# Patient Record
Sex: Female | Born: 1935
Health system: Southern US, Community
[De-identification: ages and names within clinical notes are randomized; demographics above are authoritative.]

## PROBLEM LIST (undated history)

## (undated) DIAGNOSIS — H6120 Impacted cerumen, unspecified ear: Secondary | ICD-10-CM

## (undated) DIAGNOSIS — I10 Essential (primary) hypertension: Secondary | ICD-10-CM

## (undated) DIAGNOSIS — E785 Hyperlipidemia, unspecified: Secondary | ICD-10-CM

## (undated) DIAGNOSIS — Z888 Allergy status to other drugs, medicaments and biological substances status: Secondary | ICD-10-CM

## (undated) DIAGNOSIS — L509 Urticaria, unspecified: Secondary | ICD-10-CM

## (undated) DIAGNOSIS — M4800 Spinal stenosis, site unspecified: Secondary | ICD-10-CM

## (undated) DIAGNOSIS — L03119 Cellulitis of unspecified part of limb: Secondary | ICD-10-CM

## (undated) DIAGNOSIS — I491 Atrial premature depolarization: Secondary | ICD-10-CM

## (undated) DIAGNOSIS — M810 Age-related osteoporosis without current pathological fracture: Secondary | ICD-10-CM

## (undated) DIAGNOSIS — I272 Pulmonary hypertension, unspecified: Secondary | ICD-10-CM

## (undated) DIAGNOSIS — K449 Diaphragmatic hernia without obstruction or gangrene: Secondary | ICD-10-CM

## (undated) DIAGNOSIS — M79609 Pain in unspecified limb: Secondary | ICD-10-CM

## (undated) DIAGNOSIS — H9190 Unspecified hearing loss, unspecified ear: Secondary | ICD-10-CM

## (undated) DIAGNOSIS — L02519 Cutaneous abscess of unspecified hand: Secondary | ICD-10-CM

## (undated) DIAGNOSIS — IMO0001 Reserved for inherently not codable concepts without codable children: Secondary | ICD-10-CM

## (undated) DIAGNOSIS — E041 Nontoxic single thyroid nodule: Secondary | ICD-10-CM

## (undated) DIAGNOSIS — E559 Vitamin D deficiency, unspecified: Secondary | ICD-10-CM

## (undated) DIAGNOSIS — IMO0002 Reserved for concepts with insufficient information to code with codable children: Secondary | ICD-10-CM

## (undated) DIAGNOSIS — R634 Abnormal weight loss: Secondary | ICD-10-CM

## (undated) DIAGNOSIS — I6990 Unspecified sequelae of unspecified cerebrovascular disease: Secondary | ICD-10-CM

## (undated) DIAGNOSIS — N390 Urinary tract infection, site not specified: Secondary | ICD-10-CM

## (undated) DIAGNOSIS — R350 Frequency of micturition: Secondary | ICD-10-CM

## (undated) DIAGNOSIS — Z9181 History of falling: Secondary | ICD-10-CM

## (undated) DIAGNOSIS — K5730 Diverticulosis of large intestine without perforation or abscess without bleeding: Secondary | ICD-10-CM

## (undated) DIAGNOSIS — M1990 Unspecified osteoarthritis, unspecified site: Secondary | ICD-10-CM

## (undated) DIAGNOSIS — J069 Acute upper respiratory infection, unspecified: Secondary | ICD-10-CM

## (undated) DIAGNOSIS — I071 Rheumatic tricuspid insufficiency: Secondary | ICD-10-CM

## (undated) DIAGNOSIS — S93402A Sprain of unspecified ligament of left ankle, initial encounter: Secondary | ICD-10-CM

## (undated) HISTORY — DX: Unspecified hearing loss, unspecified ear: H91.90

## (undated) HISTORY — DX: Pain in unspecified limb: M79.609

## (undated) HISTORY — DX: Cutaneous abscess of unspecified hand: L02.519

## (undated) HISTORY — DX: History of falling: Z91.81

## (undated) HISTORY — DX: Urinary tract infection, site not specified: N39.0

## (undated) HISTORY — DX: Diverticulosis of large intestine without perforation or abscess without bleeding: K57.30

## (undated) HISTORY — DX: Reserved for inherently not codable concepts without codable children: IMO0001

## (undated) HISTORY — DX: Nontoxic single thyroid nodule: E04.1

## (undated) HISTORY — DX: Allergy status to other drugs, medicaments and biological substances status: Z88.8

## (undated) HISTORY — DX: Age-related osteoporosis without current pathological fracture: M81.0

## (undated) HISTORY — DX: Impacted cerumen, unspecified ear: H61.20

## (undated) HISTORY — DX: Acute upper respiratory infection, unspecified: J06.9

## (undated) HISTORY — DX: Cellulitis of unspecified part of limb: L03.119

## (undated) HISTORY — DX: Atrial premature depolarization: I49.1

## (undated) HISTORY — DX: Abnormal weight loss: R63.4

## (undated) HISTORY — DX: Unspecified osteoarthritis, unspecified site: M19.90

## (undated) HISTORY — DX: Frequency of micturition: R35.0

## (undated) HISTORY — DX: Hyperlipidemia, unspecified: E78.5

## (undated) HISTORY — DX: Vitamin D deficiency, unspecified: E55.9

## (undated) HISTORY — DX: Urticaria, unspecified: L50.9

## (undated) HISTORY — DX: Unspecified sequelae of unspecified cerebrovascular disease: I69.90

## (undated) HISTORY — DX: Spinal stenosis, site unspecified: M48.00

## (undated) HISTORY — DX: Sprain of unspecified ligament of left ankle, initial encounter: S93.402A

## (undated) HISTORY — DX: Reserved for concepts with insufficient information to code with codable children: IMO0002

## (undated) HISTORY — DX: Diaphragmatic hernia without obstruction or gangrene: K44.9

---

## 1985-03-11 HISTORY — PX: LARYNGOSCOPY: SUR817

## 1988-03-11 HISTORY — PX: BACK SURGERY: SHX140

## 1990-03-11 HISTORY — PX: COLONOSCOPY: SHX174

## 1993-03-11 HISTORY — PX: FACIAL COSMETIC SURGERY: SHX629

## 1998-03-11 DIAGNOSIS — M810 Age-related osteoporosis without current pathological fracture: Secondary | ICD-10-CM

## 1998-03-11 DIAGNOSIS — IMO0002 Reserved for concepts with insufficient information to code with codable children: Secondary | ICD-10-CM

## 1998-03-11 DIAGNOSIS — K5730 Diverticulosis of large intestine without perforation or abscess without bleeding: Secondary | ICD-10-CM

## 1998-03-11 HISTORY — DX: Age-related osteoporosis without current pathological fracture: M81.0

## 1998-03-11 HISTORY — DX: Diverticulosis of large intestine without perforation or abscess without bleeding: K57.30

## 1998-03-11 HISTORY — DX: Reserved for concepts with insufficient information to code with codable children: IMO0002

## 1998-08-15 DIAGNOSIS — K449 Diaphragmatic hernia without obstruction or gangrene: Secondary | ICD-10-CM

## 1998-08-15 DIAGNOSIS — E041 Nontoxic single thyroid nodule: Secondary | ICD-10-CM

## 1998-08-15 HISTORY — DX: Nontoxic single thyroid nodule: E04.1

## 1998-08-15 HISTORY — DX: Diaphragmatic hernia without obstruction or gangrene: K44.9

## 2000-03-11 DIAGNOSIS — M4800 Spinal stenosis, site unspecified: Secondary | ICD-10-CM

## 2000-03-11 HISTORY — DX: Spinal stenosis, site unspecified: M48.00

## 2000-03-31 ENCOUNTER — Encounter: Admission: RE | Admit: 2000-03-31 | Discharge: 2000-03-31 | Payer: Self-pay | Admitting: Internal Medicine

## 2000-03-31 ENCOUNTER — Encounter: Payer: Self-pay | Admitting: Internal Medicine

## 2000-07-22 DIAGNOSIS — M1990 Unspecified osteoarthritis, unspecified site: Secondary | ICD-10-CM

## 2000-07-22 HISTORY — DX: Unspecified osteoarthritis, unspecified site: M19.90

## 2005-03-11 DIAGNOSIS — E785 Hyperlipidemia, unspecified: Secondary | ICD-10-CM

## 2005-03-11 DIAGNOSIS — I10 Essential (primary) hypertension: Secondary | ICD-10-CM

## 2005-03-11 HISTORY — DX: Hyperlipidemia, unspecified: E78.5

## 2005-03-11 HISTORY — DX: Essential (primary) hypertension: I10

## 2005-12-20 ENCOUNTER — Inpatient Hospital Stay (HOSPITAL_COMMUNITY): Admission: EM | Admit: 2005-12-20 | Discharge: 2005-12-23 | Payer: Self-pay | Admitting: Emergency Medicine

## 2005-12-20 ENCOUNTER — Encounter: Payer: Self-pay | Admitting: Cardiology

## 2005-12-20 ENCOUNTER — Ambulatory Visit: Payer: Self-pay | Admitting: Cardiology

## 2006-01-01 DIAGNOSIS — I6990 Unspecified sequelae of unspecified cerebrovascular disease: Secondary | ICD-10-CM

## 2006-01-01 HISTORY — DX: Unspecified sequelae of unspecified cerebrovascular disease: I69.90

## 2006-10-05 ENCOUNTER — Inpatient Hospital Stay (HOSPITAL_COMMUNITY): Admission: EM | Admit: 2006-10-05 | Discharge: 2006-10-06 | Payer: Self-pay | Admitting: Emergency Medicine

## 2006-10-10 DIAGNOSIS — I491 Atrial premature depolarization: Secondary | ICD-10-CM | POA: Insufficient documentation

## 2006-10-10 HISTORY — DX: Atrial premature depolarization: I49.1

## 2007-01-07 DIAGNOSIS — IMO0001 Reserved for inherently not codable concepts without codable children: Secondary | ICD-10-CM

## 2007-01-07 HISTORY — DX: Reserved for inherently not codable concepts without codable children: IMO0001

## 2008-11-01 DIAGNOSIS — L509 Urticaria, unspecified: Secondary | ICD-10-CM

## 2008-11-01 HISTORY — DX: Urticaria, unspecified: L50.9

## 2010-03-11 DIAGNOSIS — M79609 Pain in unspecified limb: Secondary | ICD-10-CM

## 2010-03-11 DIAGNOSIS — Z9181 History of falling: Secondary | ICD-10-CM

## 2010-03-11 HISTORY — DX: History of falling: Z91.81

## 2010-03-11 HISTORY — DX: Pain in unspecified limb: M79.609

## 2010-03-31 ENCOUNTER — Encounter: Payer: Self-pay | Admitting: Physical Medicine and Rehabilitation

## 2010-07-16 ENCOUNTER — Other Ambulatory Visit: Payer: Self-pay | Admitting: Internal Medicine

## 2010-07-16 DIAGNOSIS — R200 Anesthesia of skin: Secondary | ICD-10-CM

## 2010-07-19 ENCOUNTER — Ambulatory Visit
Admission: RE | Admit: 2010-07-19 | Discharge: 2010-07-19 | Disposition: A | Discharge status: Home / Self Care | Payer: MEDICARE | Source: Ambulatory Visit | Attending: Internal Medicine | Admitting: Internal Medicine

## 2010-07-19 DIAGNOSIS — R200 Anesthesia of skin: Secondary | ICD-10-CM

## 2010-07-24 NOTE — Discharge Summary (Signed)
[name], VIRAMONTES                ACCOUNT NO.:  [id number]   MEDICAL RECORD NO.:  [id number]          PATIENT TYPE:  INP   LOCATION:  6710                         FACILITY:  [facility]   PHYSICIAN:  Marcellus Scott, MD     DATE OF BIRTH:  05/14/1924   DATE OF ADMISSION:  10/05/2006  DATE OF DISCHARGE:  10/06/2006                               DISCHARGE SUMMARY   PRIMARY MEDICAL DOCTOR:  Lenon Curt. Chilton Si, M.D. of [facility].   DISCHARGE DIAGNOSES:  1. Syncope, probably vasovagal.  2. Urinary tract infection.  3. Prior cerebrovascular accident.  4. Dyslipidemia.  5. Osteoporosis.  6. History of systolic hypertension.   DISCHARGE MEDICATIONS:  1. Enteric-coated aspirin 81 mg p.o. daily.  2. Calcium 500 plus vitamin D one tablet p.o. 3 times a day.  3. Lipitor 20 mg p.o. q.h.s.  4. Magnesium 1 tablet p.o. 3 times daily.  5. Didronel every 3 months.  6. Centrum Silver 1 p.o. daily.  7. Tylenol extra strength 500 mg p.o. 3 times a day.  8. Vitamin E 200 mg p.o. daily.  9. Cipro 500 mg p.o. twice daily for 1 week.   PROCEDURES:  1. MRI of the brain without contrast, impression:      a.     No acute intracranial abnormality.      b.     Chronic small vessel disease and generalized volume loss.  2. Chest x-ray, impression:  No acute findings.  3. CT of the head without contrast, impression:      a.     No acute abnormality.      b.     Stable mild diffuse cerebral and cerebellar atrophy.      c.     Stable mild to moderate chronic small vessel white matter       ischemic changes in both hemispheres.      d.     Old bilateral lacunar infarcts.   PERTINENT LABORATORY:  Cardiac panel cycled x3, negative.  TSH of 0.442,  consider repeating TSH and free T4 and T3 as an outpatient.  Lipid panel  with total cholesterol 117, triglycerides 66, HDL 52, LDL 52, VLDL 13.  Urine culture is pending.   CONSULTATIONS:  None.   HOSPITAL COURSE AND PATIENT DISPOSITION:  For details of the  initial  part of the admission, please refer to the history and physical note  done by Dr. Waymon Amato.  In summary, [name] is a pleasant 77 year old  Caucasian female patient with a history of prior CVA with no residual  deficits, systolic hypertension- controlled by diet alone,  hyperlipidemia, and osteoporosis who was feeling unwell for the last  couple of days.  However, on the day of admission while standing in the  serving line at the church service, she felt dizzy, lightheaded, and  subsequently became unresponsive for approximately 10 minutes with  associated diaphoresis, ill look, thready pulse, and associated right  sided weakness with no seizure-like activity.  The patient also had some  urinary symptoms of frequency and lower abdominal discomfort.  She was  made to lie flat and within 10 minutes she regained consciousness with  almost resolution of her right extremity weakness, except for a slight  heaviness in both lower extremities.  She had no cardiorespiratory  symptoms.  The patient presented to the emergency room where further  evaluation revealed that she was hemodynamically stable, awake, alert,  oriented, with no focal neurological deficits.  She was, however,  admitted to the telemetry bed where there have been no alarms.  A workup  has been negative for any new strokes.  The patient is currently  hemodynamically stable, asymptomatic, and stable to be discharged home.  It is thought that her syncope was probably secondary to a vasovagal  attack, versus her ongoing urinary tract infection.   The patient has been placed on a course of ciprofloxacin and her urine  cultures have to be followed up as an outpatient.  She has an  appointment to see her primary medical doctor in approximately 10-14  days' time.  She has been instructed to seek immediate medical attention  if there is any further deterioration in her condition.      Marcellus Scott, MD  Electronically  Signed     AH/MEDQ  D:  10/06/2006  T:  10/06/2006  Job:  [id number]   cc:   Lenon Curt. Chilton Si, M.D.

## 2010-07-24 NOTE — H&P (Signed)
NAMEMCKAYLEE, DIMALANTA                ACCOUNT NO.:  [id number]   MEDICAL RECORD NO.:  [id number]          PATIENT TYPE:  INP   LOCATION:  6710                         FACILITY:  [facility]   PHYSICIAN:  Marcellus Scott, MD     DATE OF BIRTH:  12-19-1924   DATE OF ADMISSION:  10/05/2006  DATE OF DISCHARGE:                              HISTORY & PHYSICAL   PRIMARY CARE PHYSICIAN:  Lenon Curt. Chilton Si, MD, of [facility].   CHIEF COMPLAINT:  Passing out this morning.   HISTORY OF PRESENTING ILLNESS:  Ms. Bloch is a pleasant, 77 year old,  Caucasian female patient with past medical history as indicated below.  The patient's very close friend, Ms. Lanna Poche, who was with the  patient when this episode happened, is at the bedside to provide  details.  The patient has been feeling unwell for the last couple of  days.  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  While  waiting in the line, the patient complained of feeling dizzy and  lightheaded.  She subsequently sat on a chair.  However, patient was  noted to become pale, diaphoretic, staring blankly in space, not  responding appropriately, and not moving her right extremities.  Following this, the patient was lifted and put on a flat surface.  She  was noted to have a thready pulse in the 60s.  911 was called.  Over the  next 10 minutes, the patient improved and became appropriately  responsive and started moving her right limbs.  The patient was  subsequently brought to the emergency room.  She says she feels  significantly better but still complains of some heaviness in both lower  extremities, right lower than the left.  She denies a history of  headache, earache, sore throat, palpitations, or chest pain prior to the  episode or since.  The patient has no recollection of the event.  There  was no history of any seizure-like activity.   PAST MEDICAL  HISTORY:  1. History of a small, acute, right caudate, hemorrhagic infarct in      October of 2007 with no residual deficits.  2. Vertigo.  3. Chronic microvascular ischemic brain changes on CT.  4. Systolic hypertension not on medications.  5. Dyslipidemia.  6. Osteoporosis.  7. History of degenerative joint disease of the lumbosacral spine with      chronic low back pain.  8. Mammogram this year negative for malignancy.   PAST SURGICAL HISTORY:  Back surgery in 1990.   ALLERGIES:  No known drug allergies.   MEDICATIONS:  1. Enteric-coated aspirin 81 mg p.o. daily.  2. Calcium 500, one p.o. three times a day.  3. Lipitor 20 mg p.o. q.h.s.  4. Magnesium one tablet p.o. three times a day.  5. Medication for osteoporosis every three months.  6. Centrum one p.o. daily.  7. Tylenol Extra Strength one p.o. three times a day.   FAMILY HISTORY:  1. Father died at age 42 of a massive cerebrovascular accident.  2.  Mother had a cerebrovascular accident in 1979, which left her      debilitated with a feeding tube and in an incapacitated state, and      she subsequently died at age 23 years.   SOCIAL HISTORY:  The patient is widowed for the last 21 years.  She is a  retired Education officer, museum, who worked with [employer].  She has no  history of smoking, alcohol, or drug abuse.  She has two adult kids, one  in [place] and the other in [place].   ADVANCED DIRECTIVES:  The patient is a Full Code.   REVIEW OF SYSTEMS:  Over 10 systems reviewed and pertinent for frequency  of urine, cloudy urine, lower abdominal discomfort.  However, no fever,  chills, rigors, or dysuria.   PHYSICAL EXAMINATION:  GENERAL:  Ms. Grandmaison is a moderately built and  nourished female patient in no obvious distress.  VITAL SIGNS:  Temperature is 97.4 degrees Fahrenheit, blood pressure  133/71, which was 159/59 on arrival, no orthostatic changes, pulse 64  per minute regular, respirations 20 per minute regular,  saturating at  98% on room air.  HEAD, EYES, ENT:  Nontraumatic, normocephalic.  Pupils bilaterally  equally reacting to light and accommodation.  Immature cataracts.  Extraocular muscle movements are intact. Oral cavity with no  oropharyngeal erythema.  Uvula is midline and tongue movements are  normal.  NECK:  No JVD or carotid bruit or lymphadenopathy or goiter.  Supple.  LYMPHATICS:  There is no lymphadenopathy.  RESPIRATORY SYSTEM:  Clear to auscultation bilaterally.  CARDIOVASCULAR SYSTEM:  First and second heart sounds are heard, RRR, no  third or fourth heart sounds or murmurs or rubs or gallops or clicks.  ABDOMEN:  Nondistended, minimal tenderness in the suprapubic area, but  no rigidity, guarding, or rebound.  No organomegaly or masses are  palpated.  Bowel sounds are normally heard.  CENTRAL NERVOUS SYSTEM:  The patient is awake, alert, oriented x3.  No  cranial nerve deficits.  EXTREMITIES:  With no cyanosis, clubbing, or edema;  peripheries are  slightly cool.  Peripheral pulses are symmetrically felt and bounding.  There are no focal deficits.  SKIN:  Without any rashes.   PERTINENT LABORATORY RESULTS:  Urinalysis with many bacteria, white  blood cells too numerous to count.  Comprehensive metabolic panel  unremarkable with a glucose of 106, BUN 15, creatinine of 0.99.  Point-  of-care cardiac markers are negative.  PT-INR normal.  CBC is normal  with a hemoglobin of 13.6, hematocrit of 40.8.  A CT of the head without  contrast.  Impression:  A)  No acute abnormality; B)  Stable, mild,  diffuse cerebral bilateral atrophy; C)  Stable mild to moderate chronic  small vessel white matter ischemic changes in both hemispheres; D)  Old  bilateral lacunar infarcts as described above.   A chest x-ray, which has been reviewed by me and reported as no acute  findings.   EKG with a normal sinus rhythm at 62 beats per minute with no acute  changes.   ASSESSMENT AND PLAN:  1.  Syncope.  Etiology is probably vasovagal secondary to exposure to      heat and prolonged standing.  A urinary tract infection could have      contributed to this, however, to rule out a possible stroke. Will      admit patient to telemetry.  Will obtain a magnetic resonance      imaging of the head.  Will cycle patient's cardiac enzymes.  2. Urinary tract infection.  Will send off urine for culture and place      the patient on empiric intravenous ciprofloxacin.  3. History of prior cerebrovascular accident.  Will continue aspirin.  4. History of dyslipidemia.  Will check fasting lipids and continue      the patient's Lipitor.  5. Osteoporosis.  6. History of systolic hypertension.  Blood pressures at this time      have normalized and will monitor off medications.      Marcellus Scott, MD  Electronically Signed     AH/MEDQ  D:  10/05/2006  T:  10/05/2006  Job:  [id number]   cc:   Lenon Curt. Chilton Si, M.D.

## 2010-07-27 NOTE — H&P (Signed)
[name], Cox                ACCOUNT NO.:  [id number]   MEDICAL RECORD NO.:  [id number]          PATIENT TYPE:  EMS   LOCATION:  ED                           [facility]AC[facility]   PHYSICIAN:  Mobolaji B. Bakare, M.D.DATE OF BIRTH:  January 02, 1925   DATE OF ADMISSION:  12/19/2005  DATE OF DISCHARGE:                                HISTORY & PHYSICAL   PRIMARY CARE PHYSICIAN:  Lenon Curt. Chilton Si, M.D.   CHIEF COMPLAINT:  Dizziness/spinning sensation which started about 6 a.m.  today.   HISTORY OF PRESENTING COMPLAINT:  [name] is a pleasant 77 year old  Caucasian female who has no known medical illness or back pain.  She was in  her usual state of health until about 6 a.m. this morning when she woke up  and wanted to use the bathroom.  She realized that she was unsteady on her  feet and had to hold on to the wall.  This has progressively gotten worse  through the day.  She called Dr. Thomasene Lot office and was given an appointment  to see a physician at 6:30, but she could not make this appointment because  the symptoms became unbearable.  She lives alone; hence, she called an  ambulance and was brought to the emergency room.  This unsteadiness is  associated with a spinning sensation around her.  There is associated nausea  and vomiting, but no diarrhea.  Each time she tries to get up, she feels  nauseous and develops a spinning sensation.  She denies any tinnitus,  hearing loss, otorrhea or rhinorrhea.  There was no recent upper respiratory  infection.  This is not related to any particular positioning.  She has  received antibiotics in the emergency room, and the patient currently feels  better.  Because she lives alone, and she was uncomfortable being discharged  home, the patient is going to be admitted for 24 hour observation.   In addition, she had a head CT scan which showed no bleed.  It was commented  that the basilar artery is slightly dense, which may be normal, but cannot  rule out thrombosis.  The ER physician discussed with Dr. Chestine Spore  (radiologist), and he reviewed the CT scan and compared to a followup MRI,  he stated that the basilar artery is patent.  Other findings on CT scan of  the head include atrophy and chronic microvascular white matter disease,  posterior right extracapsular small infarction, likely remote.  A followup  MRI of the brain showed a small enhancing area of the right caudate,  probable subacute infarction, but the diffusion imaging was negative.  A  followup MRI in 2-4 weeks was recommended.   REVIEW OF SYSTEMS:  The patient denies diarrhea, abdominal pain.  No chest  pain, shortness of breath.  No dysuria or urgency.  There are no headaches  or change in her vision.   PAST MEDICAL HISTORY:  Chronic back pain.  She is scheduled for have a  steroid shot sometime next week.   MEDICATIONS:  Aspirin 325 mg one three times a day.  The patient  has been  using this chronically to relieve her pain.   ALLERGIES:  No known drug allergies.   FAMILY HISTORY:  She has a significant family history of stroke in a father  who passed away from a massive stroke at the age of 52.  Mother also had a  stroke at the age of 102.  She died at the age of 9.   SOCIAL HISTORY:  The patient lives alone.  She is a widow.  She has 2 sons -  1 in [place] and 1 in [place].  She does smoke cigarettes or drink alcohol.  She worked at [employer] in the past.  She is currently retired.   PHYSICAL EXAMINATION:  INITIAL VITALS:  Temperature 98.4, blood pressure  121/86, pulse of 90, respiratory rate 18.  O2 saturation of 96% on room air.  GENERAL:  The patient is awake, alert, oriented to time, place and person.  HEENT:  Normocephalic and atraumatic.  Extraocular muscle movements intact.  There is nystagmus to the right.  Pupils equal, round and reactive to light.  No carotid bruit.  No elevated JVD.  No thyromegaly.  LUNGS:  Clear clinically to  auscultation.  CV:  S1 and S2, regular.  No murmur, no gallop.  ABDOMEN:  Nondistended, soft, nontender.  EXTREMITIES:  No pedal edema, no calf tenderness.  Dorsalis pedis pulses 2+  bilaterally.  CNS:  There are no focal neurological deficits.  Muscle power 5/5 in all  limbs and symmetrical.  Deep tendon reflexes 2+ in both triceps and biceps.  Symmetrical bilateral knee jerk 1+ bilaterally and symmetrical.  Cranial  nerves II-XII revealed no deficit.   INITIAL LABORATORY DATA:  Salicylate level less than 4.  Sodium 138,  potassium 4.0, chloride 107, CO2 of 26, glucose 116, BUN 10, creatinine  0.87, bilirubin 0.5.  Alkaline phosphatase was 20, AST 24, ALT 15.  Total  protein 6.3.  Albumin 3.3, calcium 8.6.  EKG revealed normal sinus rhythm.  No acute ST changes.  Normal intervals.  Heart rate of 70 beats per minute.   ASSESSMENT AND PLAN:  [name] is a 77 year old elderly lady presenting  with acute onset of vertigo, nausea, vomiting.  MRI of the head is  suspicious for some acute infarction in the caudate lobe on the right side.  I do not think this would be responsible for acute symptoms.  However, there  is a suspicion for thrombosis in the basilar artery.  1. Vertigo.  Probable labyrinthitis.  Symptomatically, it does not have to      be benign positional vertigo.  We will need to exclude posterior      circulation stenosis.  Will treat symptomatically with Antivert 12.5 mg      b.i.d. and q.h.s. p.r.n.  Continue aspirin 325 mg daily.  Check MRI of      the neck.  Phenergan will be given 6.25 mg q.4h. p.r.n. for nausea and      vomiting.  PT and OT will be consulted.  2. Question of subacute infarction, right caudate lobe.  Will initiate      stroke work-up with a homocystine level, fasting lipid profile.  Will      check hemoglobin A1C, 2-D echocardiogram, and carotid Dopplers.  Would      continue full dose aspirin.  If cerebrovascular accident is confirmed,     consideration  will be given to changing aspirin to Aggrenox.      Mobolaji B. Corky Downs, M.D.  Electronically Signed     MBB/MEDQ  D:  12/20/2005  T:  12/20/2005  Job:  [id number]   cc:   Lenon Curt. Chilton Si, M.D.  Fax: [phone]

## 2010-07-27 NOTE — Discharge Summary (Signed)
NAMEVERDELLA, LAIDLAW                ACCOUNT NO.:  [id number]   MEDICAL RECORD NO.:  [id number]          PATIENT TYPE:  INP   LOCATION:  1429                         FACILITY:  [facility]   PHYSICIAN:  Lonia Blood, M.D.       DATE OF BIRTH:  [birth year]   DATE OF ADMISSION:  12/19/2005  DATE OF DISCHARGE:  12/23/2005                                 DISCHARGE SUMMARY   PRIMARY CARE PHYSICIAN:  Lenon Curt. Chilton Si, M.D.   DISCHARGE DIAGNOSES:  1. Small, acute hemorrhagic infarct in the right caudate.  2. Vertigo, most likely secondary to the stroke, resolving.  3. Chronic microvascular ischemic changes in the brain.  4. Systolic hypertension.  5. Mild hyperlipidemia.  6. Chronic lumbosacral spine degenerative changes with chronic low back      pain.   DISCHARGE MEDICATIONS:  1. Tylenol 500 mg by mouth every 4 hours as needed for pain.  2. Vicodin 5/500 one every 6 hours as needed for severe pain.  3. Meclizine 12.5 mg three times a day as needed for dizziness.  4. Aspirin 81 mg to start on January 02, 2006.  5. Multivitamin one daily.   CONDITION ON DISCHARGE:  The patient was discharged in good condition at  time of discharge.  All her neurological symptoms have resolved.  She was  instructed to follow up with Dr. Murray Hodgkins.  At time of followup, she  will negotiate initiation of antihypertensive medication and she will report  about the side effects of the Vicodin.  The patient will also continued to  follow up with her primary orthopedic surgeon for her back pain.  At the  time of the discharge, the patient was set up with home health PT and OT for  safety evaluation.   PROCEDURES:  1. On December 19, 2005, the patient underwent a noncontrast head CT that      showed small infarct in the posterior right external capsule with      indeterminate age.  2. On December 19 1005,  magnetic resonance imaging of the brain that      showed subacute, 1 cm, hemorrhagic infarct in the right  caudate.  3. Magnetic resonance angiogram of the brain on December 19, 2005, showing      mild atherosclerosis, but no definite stenosis.  4. On December 20, 2005, the patient underwent carotid ultrasound with no      internal carotid artery stenosis bilaterally.  5. On December 20, 2005, transthoracic echocardiogram showing ejection      fraction of 55-60%, mild increase in the left ventricular wall      thickness, otherwise fairly normal transthoracic echocardiogram.   CONSULTATIONS:  No consultations were obtained.   HISTORY OF PRESENT ILLNESS:  For admission history and physical, refer to  dictated H&P done by Dr. Corky Downs on December 19, 2005.   HOSPITAL COURSE:  Problem 1.  ACUTE ONSET VERTIGO:  Ms. Worth was admitted  on the night of December 19, 2005, with complaints of acute onset vertigo,  nystagmus and balance problems.  She was found to have a small  lacunar CVA  that was hemorrhagic in nature.  We have discontinued the patient's aspirin  and observed her in the hospital and slowly, but surely her symptoms have  resolved completely.  We feel that she probably had a small lacunar stroke  related to her hypertension that had a hemorrhagic transformation from the  high dose of aspirin that the patient was taking.  In the long run, the  patient definitely will benefit from lower doses of aspirin to prevent  recurrent lacunar strokes.   Problem 2.  MILD SYSTOLIC HYPERTENSION:  We had long discussions with Ms.  Navarrette about the importance of maybe initiating antihypertensive treatment.  She is very reluctant to do so due to numerous side effects of the  antihypertensives.  I will leave it up to the patient's primary care  physician initiation of antihypertensive treatment, but probably a low-dose  diuretic will be a good idea.   Problem 3.  MILD HYPERLIPIDEMIA:  Ms. Paulson LDL was found to be 120.  Given  her current risk factor profile, she is right at the borderline for needing  to be  treated.  Again, I will leave this to the discretion of primary care  physician due to the fact the patient is kind of reluctant to start taking  medications.   Problem 4.  CHRONIC BACK PAIN SECONDARY TO DEGENERATIVE DISK DISEASE OF THE  LUMBOSACRAL SPINE:  Ms. Pickerel was taking off of high dose aspirin and she  was transitioned to Tylenol for her back pain.  She continued to have  significant back pain, so for this reason she was advised to use Vicodin as  needed for severe pain.  She will follow up with Dr. Murray Hodgkins and  negotiate different pain medication if she cannot tolerate the Vicodin.      Lonia Blood, M.D.  Electronically Signed     SL/MEDQ  D:  12/23/2005  T:  12/24/2005  Job:  [id number]   cc:   Lenon Curt. Chilton Si, M.D.  Fax: [phone]

## 2010-07-31 ENCOUNTER — Emergency Department (HOSPITAL_COMMUNITY): Payer: Medicare Other

## 2010-07-31 ENCOUNTER — Emergency Department (HOSPITAL_COMMUNITY)
Admission: EM | Admit: 2010-07-31 | Discharge: 2010-07-31 | Disposition: A | Discharge status: Home / Self Care | Payer: Medicare Other | Attending: Emergency Medicine | Admitting: Emergency Medicine

## 2010-07-31 DIAGNOSIS — N390 Urinary tract infection, site not specified: Secondary | ICD-10-CM | POA: Insufficient documentation

## 2010-07-31 DIAGNOSIS — E7800 Pure hypercholesterolemia, unspecified: Secondary | ICD-10-CM | POA: Insufficient documentation

## 2010-07-31 DIAGNOSIS — M5412 Radiculopathy, cervical region: Secondary | ICD-10-CM | POA: Insufficient documentation

## 2010-07-31 DIAGNOSIS — I10 Essential (primary) hypertension: Secondary | ICD-10-CM | POA: Insufficient documentation

## 2010-07-31 DIAGNOSIS — M542 Cervicalgia: Secondary | ICD-10-CM | POA: Insufficient documentation

## 2010-07-31 DIAGNOSIS — R110 Nausea: Secondary | ICD-10-CM | POA: Insufficient documentation

## 2010-07-31 DIAGNOSIS — Z8673 Personal history of transient ischemic attack (TIA), and cerebral infarction without residual deficits: Secondary | ICD-10-CM | POA: Insufficient documentation

## 2010-07-31 LAB — CBC
HCT: 39.5 % (ref 36.0–46.0)
Hemoglobin: 13.4 g/dL (ref 12.0–15.0)
MCH: 29.8 pg (ref 26.0–34.0)
MCHC: 33.9 g/dL (ref 30.0–36.0)
MCV: 87.8 fL (ref 78.0–100.0)

## 2010-07-31 LAB — URINALYSIS, ROUTINE W REFLEX MICROSCOPIC
Bilirubin Urine: NEGATIVE
Specific Gravity, Urine: 1.009 (ref 1.005–1.030)
Urobilinogen, UA: 0.2 mg/dL (ref 0.0–1.0)

## 2010-07-31 LAB — URINE MICROSCOPIC-ADD ON

## 2010-07-31 LAB — DIFFERENTIAL
Lymphocytes Relative: 30 % (ref 12–46)
Monocytes Absolute: 0.6 10*3/uL (ref 0.1–1.0)
Monocytes Relative: 8 % (ref 3–12)
Neutro Abs: 4.9 10*3/uL (ref 1.7–7.7)

## 2010-07-31 LAB — BASIC METABOLIC PANEL
CO2: 28 mEq/L (ref 19–32)
Calcium: 9.7 mg/dL (ref 8.4–10.5)
Creatinine, Ser: 0.96 mg/dL (ref 0.4–1.2)
Glucose, Bld: 95 mg/dL (ref 70–99)

## 2010-08-01 LAB — URINE CULTURE: Culture  Setup Time: 201205230113

## 2010-09-18 ENCOUNTER — Emergency Department (HOSPITAL_BASED_OUTPATIENT_CLINIC_OR_DEPARTMENT_OTHER)
Admission: EM | Admit: 2010-09-18 | Discharge: 2010-09-18 | Disposition: A | Discharge status: Still In Hospital | Payer: Medicare Other | Source: Home / Self Care | Attending: Emergency Medicine | Admitting: Emergency Medicine

## 2010-09-18 ENCOUNTER — Inpatient Hospital Stay (HOSPITAL_COMMUNITY)
Admission: AD | Admit: 2010-09-18 | Discharge: 2010-09-20 | DRG: 603 | Disposition: A | Discharge status: Home / Self Care | Payer: Medicare Other | Source: Other Acute Inpatient Hospital | Attending: Internal Medicine | Admitting: Internal Medicine

## 2010-09-18 ENCOUNTER — Inpatient Hospital Stay (HOSPITAL_COMMUNITY): Payer: Medicare Other

## 2010-09-18 DIAGNOSIS — L0390 Cellulitis, unspecified: Secondary | ICD-10-CM

## 2010-09-18 DIAGNOSIS — D509 Iron deficiency anemia, unspecified: Secondary | ICD-10-CM | POA: Diagnosis present

## 2010-09-18 DIAGNOSIS — L02519 Cutaneous abscess of unspecified hand: Secondary | ICD-10-CM | POA: Diagnosis present

## 2010-09-18 DIAGNOSIS — L03019 Cellulitis of unspecified finger: Secondary | ICD-10-CM | POA: Diagnosis present

## 2010-09-18 DIAGNOSIS — M810 Age-related osteoporosis without current pathological fracture: Secondary | ICD-10-CM | POA: Diagnosis present

## 2010-09-18 DIAGNOSIS — T63461A Toxic effect of venom of wasps, accidental (unintentional), initial encounter: Secondary | ICD-10-CM | POA: Diagnosis present

## 2010-09-18 DIAGNOSIS — T6391XA Toxic effect of contact with unspecified venomous animal, accidental (unintentional), initial encounter: Secondary | ICD-10-CM | POA: Diagnosis present

## 2010-09-18 DIAGNOSIS — R29898 Other symptoms and signs involving the musculoskeletal system: Secondary | ICD-10-CM | POA: Diagnosis present

## 2010-09-18 DIAGNOSIS — IMO0002 Reserved for concepts with insufficient information to code with codable children: Principal | ICD-10-CM | POA: Diagnosis present

## 2010-09-18 DIAGNOSIS — I10 Essential (primary) hypertension: Secondary | ICD-10-CM | POA: Diagnosis present

## 2010-09-18 DIAGNOSIS — I69998 Other sequelae following unspecified cerebrovascular disease: Secondary | ICD-10-CM

## 2010-09-18 HISTORY — DX: Essential (primary) hypertension: I10

## 2010-09-18 LAB — BASIC METABOLIC PANEL
Calcium: 10.6 mg/dL — ABNORMAL HIGH (ref 8.4–10.5)
Creatinine, Ser: 0.9 mg/dL (ref 0.50–1.10)
GFR calc Af Amer: >60 mL/min (ref >60)
GFR calc non Af Amer: 60 mL/min — ABNORMAL LOW (ref >60)

## 2010-09-18 LAB — COMPREHENSIVE METABOLIC PANEL
BUN: 11 mg/dL (ref 6–23)
CO2: 27 mEq/L (ref 19–32)
Calcium: 8.9 mg/dL (ref 8.4–10.5)
Chloride: 97 mEq/L (ref 96–112)
Creatinine, Ser: 0.8 mg/dL (ref 0.50–1.10)
GFR calc Af Amer: >60 mL/min (ref >60)
GFR calc non Af Amer: >60 mL/min (ref >60)
Total Bilirubin: 0.3 mg/dL (ref 0.3–1.2)

## 2010-09-18 LAB — DIFFERENTIAL
Basophils Absolute: 0 10*3/uL (ref 0.0–0.1)
Basophils Relative: 0 % (ref 0–1)
Eosinophils Absolute: 0 10*3/uL (ref 0.0–0.7)
Eosinophils Relative: 0 % (ref 0–5)
Monocytes Absolute: 0.4 10*3/uL (ref 0.1–1.0)
Neutro Abs: 2.8 10*3/uL (ref 1.7–7.7)

## 2010-09-18 LAB — CK: Total CK: 32 U/L (ref 7–177)

## 2010-09-18 LAB — CBC
HCT: 43.9 % (ref 36.0–46.0)
MCH: 29.4 pg (ref 26.0–34.0)
MCHC: 34.6 g/dL (ref 30.0–36.0)
MCV: 84.9 fL (ref 78.0–100.0)
RDW: 13 % (ref 11.5–15.5)

## 2010-09-18 MED ORDER — IOHEXOL 300 MG/ML  SOLN
80.0000 mL | Freq: Once | INTRAMUSCULAR | Status: AC | PRN
  Administered 2010-09-18: 80 mL via INTRAVENOUS

## 2010-09-18 MED ORDER — SODIUM CHLORIDE 0.9 % IV SOLN
INTRAVENOUS | Status: DC
  Administered 2010-09-18 (×2): via INTRAVENOUS

## 2010-09-18 MED ORDER — VANCOMYCIN HCL 10 G IV SOLR
1.0000 g | Freq: Once | INTRAVENOUS | Status: AC
  Administered 2010-09-18: 1 g via INTRAVENOUS

## 2010-09-18 MED ORDER — VANCOMYCIN HCL IN DEXTROSE 1-5 GM/200ML-% IV SOLN
INTRAVENOUS | Status: AC
  Administered 2010-09-18: 1 g via INTRAVENOUS
  Filled 2010-09-18: qty 200

## 2010-09-18 NOTE — ED Notes (Signed)
Patient reports that she was stung by hornet on right thumb Sunday and is continuing to have pain and swelling to same with red streak up arm to right axilla

## 2010-09-18 NOTE — ED Provider Notes (Signed)
History     Chief Complaint  Patient presents with  . Insect Bite   HPI Pt presents with c/o insect bite of right thumb- was stung by hornet.  Occurred approx 2 days ago. Today noted that redness and warmth/pain was tracking up her right arm into her axilla.  No fever/chills, no systemic symptoms No lip/tongue swelling, no sob.  Past Medical History  Diagnosis Date  . Degeneration of cervicothoracic intervertebral disc   . Hypertension     History reviewed. No pertinent past surgical history.  History reviewed. No pertinent family history.  History  Substance Use Topics  . Smoking status: Never Smoker   . Smokeless tobacco: Not on file  . Alcohol Use: No    OB History    Grav Para Term Preterm Abortions TAB SAB Ect Mult Living                  Review of SystemsROS reviewed and otherwise negative except for mentioned in HPI   Physical Exam  BP 144/76  Pulse 91  Temp(Src) 97.7 F (36.5 C) (Oral)  Resp 18  SpO2 100%  Physical Exam  Vitals reviewed. Gen- awake, alert, NAD Eyes- PERRL, EOMI, normal appearance HEENT- MMM, OP clear Neck- supple, from Lungs- CTAB, breath sounds symmetric, no accessory muscle movement or dyspnea CV-RRR, no murmur/rub/gallop Abd- soft, nontender, nondistended, normal active bowel sounds GU- no CVA tenderness Ext-see skin exam, no peripheral edema, neurovascularly intact, 2+ dp pulses, brisk cap refill Skin- erythema and warmth with some edema of right thumb- ertythematous streaking up right arm to axilla     ED Course  Procedures Labs obtained including blood cultures.  Vancomycin IV started.  WBC not elevated. Will proceed with admission for IV antibiotics [facility] paged, awaiting call back.  Discussed with [facility]- temporary admission orders written, pt going to [facility], Team 5, [facility] MDM Cellulitis of right upper extremity after insect bite.        Ethelda Chick, MD 09/18/10 1415

## 2010-09-18 NOTE — H&P (Signed)
[name], [name]                ACCOUNT NO.:  [id number]  MEDICAL RECORD NO.:  [id number]  LOCATION:  1341                         FACILITY:  [facility]  PHYSICIAN:  Marinda Elk, M.D.DATE OF BIRTH:  1924/07/17  DATE OF ADMISSION:  09/18/2010 DATE OF DISCHARGE:                             HISTORY & PHYSICAL   PRIMARY CARE DOCTOR:  Lenon Curt. Chilton Si, MD  CHIEF COMPLAINT:  Thumb swelling.  HISTORY OF PRESENT ILLNESS:  This is an 77 year old with past medical history of hypertension that comes in 2 days prior to admission.  She went to grab a flower and there was a bee and it stung her thumb. Since then, her thumb has been progressively getting worse.  She takes Percocet for pain with no improvement.  Her pain has gotten more and more swollen and redness is coming up her radial side of the forearm and medial side of the arm.  She relates no fever, chills, nausea, vomiting, diarrhea, chest pain.  ALLERGIES:  No known drug allergies.  PAST MEDICAL HISTORY: 1. History of syncope in 2008. 2. UTI. 3. CVA.  Small acute right carotid hemorrhagic infarct in October 2007     with right side deficits. 4. Dyslipidemia. 5. Hypertension. 6. Osteoporosis.  MEDICATIONS: 1. Vitamin E 1000 units daily. 2. Calcium carbonate 500 mg 1 tab daily. 3. Mag oxide 250 mg 1 tab t.i.d. 4. Amlodipine 5 mg daily. 5. Percocet 5/325 mg p.o. at bedtime. 6. Fish oil 100 mg cap daily. 7. Centrum Silver b.i.d. 8. Didronel 1 tablet every 3 months. 9. Aspirin 81 mg daily. 10.Vitamin D3 1000 units daily. 11.Crestor 10 mg daily.  FAMILY HISTORY:  Father died at the age 53 of massive CVA.  Her mother died of CVA in 63 which left her debilitative with a feeding tube and passed subsequently in 56.  SOCIAL HISTORY:  The patient lives in [facility].  Denies alcohol, tobacco, or drugs.  REVIEW OF SYSTEMS:  Ten-point review of systems done, pertinent positives per HPI.  PHYSICAL EXAMINATION:   VITAL SIGNS:  Temperature 98, pulse 92, respirations of 20, she is satting 97% on room air, and blood pressure 141/69. GENERAL:  She is awake, alert and oriented x3, coherent and fluent language. HEENT:  Anicteric.  No pallor, atraumatic, normocephalic. CARDIOVASCULAR:  Regular rate and rhythm with positive S1 and S2.  No murmurs, rubs, or gallops. LUNGS:  Good air movement, clear to auscultation. ABDOMEN:  Positive bowel sounds, nontender, nondistended, and soft. EXTREMITIES:  Positive pulses.  No cyanosis, clubbing, or edema.  Her right hand thumb is swollen, tender to palpation, no fluctuations or masses.  She cannot move her joints. SKIN:  She has a red streak in the radial side of her forearm and medial side of her arm extending almost to her axilla.  We have marked this area. NEUROLOGICAL:  Alert and oriented x3, coherent and fluent language, and nonfocal.  LABORATORY DATA:  Labs on admission shows sodium 134, potassium 3.7, chloride 94, bicarb 26, glucose 106,  BUN 11, creatinine 0.9, calcium 10.6.  White count 4.6, hemoglobin of 15.2, platelet count 234 with an ANC of 2.8.  ASSESSMENT AND PLAN: 1.  Cellulitis over forearm,  secondary to bee sting.  We     will start on IV vancomycin and clindamycin, get blood cultures, monitor her     closely.  We will check a CK now and CK in the morning.  If no     improvement, she might need steroids or even call a Careers adviser.  I     will  get an ct scan of hand as it seem pretty red and tender to     palpation. To rule abscess 2. History of cerebrovascular accident, continue aspirin, no changes     will be made. 3. Hypertension, currently well controlled, no changes were made.     Marinda Elk, M.D.     AF/MEDQ  D:  09/18/2010  T:  09/18/2010  Job:  [id number]  Electronically Signed by Marinda Elk M.D. on 09/18/2010 09:56:04 PM

## 2010-09-19 LAB — BASIC METABOLIC PANEL
Chloride: 101 mEq/L (ref 96–112)
Creatinine, Ser: 0.99 mg/dL (ref 0.50–1.10)
GFR calc Af Amer: >60 mL/min (ref >60)
Potassium: 3.7 mEq/L (ref 3.5–5.1)
Sodium: 134 mEq/L — ABNORMAL LOW (ref 135–145)

## 2010-09-19 LAB — VITAMIN B12: Vitamin B-12: 1122 pg/mL — ABNORMAL HIGH (ref 211–911)

## 2010-09-19 LAB — IRON AND TIBC: UIBC: 189 ug/dL

## 2010-09-19 LAB — CBC
Platelets: 207 10*3/uL (ref 150–400)
RBC: 4.04 MIL/uL (ref 3.87–5.11)
RDW: 13.1 % (ref 11.5–15.5)
WBC: 4.5 10*3/uL (ref 4.0–10.5)

## 2010-09-19 LAB — CK: Total CK: 26 U/L (ref 7–177)

## 2010-09-19 LAB — FERRITIN: Ferritin: 87 ng/mL (ref 10–291)

## 2010-09-19 LAB — FOLATE: Folate: >20 ng/mL

## 2010-09-20 LAB — DIFFERENTIAL
Eosinophils Relative: 3 % (ref 0–5)
Lymphocytes Relative: 63 % — ABNORMAL HIGH (ref 12–46)
Lymphs Abs: 3.2 10*3/uL (ref 0.7–4.0)
Monocytes Relative: 11 % (ref 3–12)

## 2010-09-20 LAB — CBC
HCT: 35.7 % — ABNORMAL LOW (ref 36.0–46.0)
MCH: 29.5 pg (ref 26.0–34.0)
MCV: 87.7 fL (ref 78.0–100.0)
RDW: 13 % (ref 11.5–15.5)
WBC: 5 10*3/uL (ref 4.0–10.5)

## 2010-09-20 LAB — BASIC METABOLIC PANEL
BUN: 16 mg/dL (ref 6–23)
CO2: 28 mEq/L (ref 19–32)
Chloride: 101 mEq/L (ref 96–112)
Creatinine, Ser: 1.03 mg/dL (ref 0.50–1.10)
GFR calc Af Amer: >60 mL/min (ref >60)

## 2010-09-24 LAB — CULTURE, BLOOD (ROUTINE X 2)
Culture  Setup Time: 201207102347
Culture: NO GROWTH

## 2010-09-25 LAB — CULTURE, BLOOD (ROUTINE X 2)

## 2010-10-04 DIAGNOSIS — L02519 Cutaneous abscess of unspecified hand: Secondary | ICD-10-CM

## 2010-10-04 HISTORY — DX: Cutaneous abscess of unspecified hand: L02.519

## 2010-12-24 LAB — LIPID PANEL
LDL Cholesterol: 52
Total CHOL/HDL Ratio: 2.3
VLDL: 13

## 2010-12-24 LAB — CBC
HCT: 40.8
MCV: 91.3
Platelets: 261
RDW: 14.1 — ABNORMAL HIGH

## 2010-12-24 LAB — URINALYSIS, ROUTINE W REFLEX MICROSCOPIC
Bilirubin Urine: NEGATIVE
Glucose, UA: NEGATIVE
Ketones, ur: NEGATIVE
pH: 8

## 2010-12-24 LAB — COMPREHENSIVE METABOLIC PANEL
Albumin: 4
BUN: 15
Chloride: 102
Creatinine, Ser: 0.99
Total Bilirubin: 0.7
Total Protein: 6.9

## 2010-12-24 LAB — URINE MICROSCOPIC-ADD ON

## 2010-12-24 LAB — DIFFERENTIAL
Basophils Absolute: 0
Lymphocytes Relative: 17
Monocytes Absolute: 0.5
Neutro Abs: 6.7

## 2010-12-24 LAB — URINE CULTURE

## 2010-12-24 LAB — TSH: TSH: 0.442

## 2010-12-24 LAB — CARDIAC PANEL(CRET KIN+CKTOT+MB+TROPI)
Relative Index: INVALID
Total CK: 30
Total CK: 35
Troponin I: 0.02

## 2010-12-24 LAB — PROTIME-INR: INR: 1

## 2010-12-24 LAB — POCT CARDIAC MARKERS
Myoglobin, poc: 70.2
Operator id: 196461

## 2010-12-24 LAB — CK TOTAL AND CKMB (NOT AT ARMC): Relative Index: INVALID

## 2011-03-12 DIAGNOSIS — J069 Acute upper respiratory infection, unspecified: Secondary | ICD-10-CM

## 2011-03-12 HISTORY — DX: Acute upper respiratory infection, unspecified: J06.9

## 2011-04-04 DIAGNOSIS — Z888 Allergy status to other drugs, medicaments and biological substances status: Secondary | ICD-10-CM

## 2011-04-04 HISTORY — DX: Allergy status to other drugs, medicaments and biological substances: Z88.8

## 2011-04-25 DIAGNOSIS — R634 Abnormal weight loss: Secondary | ICD-10-CM | POA: Insufficient documentation

## 2011-04-25 DIAGNOSIS — E559 Vitamin D deficiency, unspecified: Secondary | ICD-10-CM

## 2011-04-25 HISTORY — DX: Abnormal weight loss: R63.4

## 2011-04-25 HISTORY — DX: Vitamin D deficiency, unspecified: E55.9

## 2012-06-25 ENCOUNTER — Encounter: Payer: Self-pay | Admitting: Internal Medicine

## 2012-06-25 ENCOUNTER — Non-Acute Institutional Stay: Payer: Medicare Other | Admitting: Internal Medicine

## 2012-06-25 VITALS — BP 122/60 | HR 72 | Ht 62.0 in | Wt 110.0 lb

## 2012-06-25 DIAGNOSIS — H6120 Impacted cerumen, unspecified ear: Secondary | ICD-10-CM

## 2012-06-25 DIAGNOSIS — H6122 Impacted cerumen, left ear: Secondary | ICD-10-CM

## 2012-06-25 DIAGNOSIS — Z5189 Encounter for other specified aftercare: Secondary | ICD-10-CM

## 2012-06-25 DIAGNOSIS — Z9181 History of falling: Secondary | ICD-10-CM

## 2012-06-25 DIAGNOSIS — S93402D Sprain of unspecified ligament of left ankle, subsequent encounter: Secondary | ICD-10-CM

## 2012-06-25 DIAGNOSIS — M4800 Spinal stenosis, site unspecified: Secondary | ICD-10-CM

## 2012-06-25 DIAGNOSIS — S93402A Sprain of unspecified ligament of left ankle, initial encounter: Secondary | ICD-10-CM | POA: Insufficient documentation

## 2012-06-25 DIAGNOSIS — I491 Atrial premature depolarization: Secondary | ICD-10-CM

## 2012-06-25 DIAGNOSIS — R634 Abnormal weight loss: Secondary | ICD-10-CM

## 2012-06-25 DIAGNOSIS — E785 Hyperlipidemia, unspecified: Secondary | ICD-10-CM

## 2012-06-25 DIAGNOSIS — I10 Essential (primary) hypertension: Secondary | ICD-10-CM

## 2012-06-25 DIAGNOSIS — E041 Nontoxic single thyroid nodule: Secondary | ICD-10-CM

## 2012-06-25 DIAGNOSIS — G8929 Other chronic pain: Secondary | ICD-10-CM | POA: Insufficient documentation

## 2012-06-25 NOTE — Patient Instructions (Signed)
Continue current medications. Place a couple of drops of olive oil in the ears weekly to help prevent wax impactions.

## 2012-06-28 DIAGNOSIS — Z9181 History of falling: Secondary | ICD-10-CM | POA: Insufficient documentation

## 2012-06-28 NOTE — Progress Notes (Signed)
Subjective:    Patient ID: [name], female    DOB: 1924/12/14, 79 y.o.   MRN: [id number]  HPI Problem List Items Addressed This Visit     ICD-9-CM   Spinal stenosis, unspecified region other than cervical (Chronic)   Loss of weight (Chronic)   Relevant Orders      TSH   Hyperlipidemia (Chronic)   Relevant Orders      Lipid panel   Personal history of fall (Chronic)   Hypertension - Primary   Relevant Orders      CMP with eGFR   Nontoxic uninodular goiter   Relevant Orders      TSH   Supraventricular premature beats   Relevant Orders      EKG 12-Lead   Sprain of ankle, left   Impacted cerumen        Review of Systems Review of Systems - History obtained from the patient General ROS: positive for  - weight loss negative for - fever, hot flashes, night sweats or sleep disturbance Psychological ROS: negative for - behavioral disorder, concentration difficulties, disorientation, memory difficulties, mood swings or sleep disturbances Ophthalmic ROS: negative ENT ROS: negative except for the left ear feeling like there is something in it. Allergy and Immunology ROS: negative Hematological and Lymphatic ROS: negative Endocrine ROS: negative Breast ROS: negative for breast lumps Respiratory ROS: no cough, shortness of breath, or wheezing. Chronically hoarse. Cardiovascular ROS: no chest pain or dyspnea on exertion Gastrointestinal ROS: no abdominal pain, change in bowel habits, or black or bloody stools Genito-Urinary ROS: no dysuria, trouble voiding, or hematuria Musculoskeletal ROS: positive for - joint pain, joint stiffness and muscular weakness negative for - pain in back - generalized Neurological ROS: no TIA or stroke symptoms. Patient was briefly hemiparetic on the right side following a TIA. This is fully resolved. Dermatological ROS: negative     Objective:   Physical Exam  Constitutional: She is oriented to person, place, and time. No distress.  Patient  is very thin.  HENT:  No evidence of trauma. There is a wax occlusion of the left external auditory canal.  Eyes: Conjunctivae and EOM are normal. Pupils are equal, round, and reactive to light.  Neck: Normal range of motion. Neck supple. No JVD present. No tracheal deviation present. No thyromegaly present.  Cardiovascular: Normal rate, regular rhythm, normal heart sounds and intact distal pulses.  Exam reveals no gallop and no friction rub.   No murmur heard. Pulmonary/Chest: Effort normal and breath sounds normal. No respiratory distress. She has no wheezes. She has no rales. She exhibits no tenderness.  Patient is chronically course.  Abdominal: Soft. Bowel sounds are normal. She exhibits no distension and no mass. There is no tenderness.  Musculoskeletal: Normal range of motion. She exhibits tenderness. She exhibits no edema.  Left deltoid ligament of the ankle has been sprained and she has swelling which goes onto the forefoot as well as a large ecchymosis.  Lymphadenopathy:    She has no cervical adenopathy.  Neurological: She is alert and oriented to person, place, and time. No cranial nerve deficit. Coordination normal.  Skin: Skin is warm and dry. No rash noted. She is not diaphoretic. No erythema. No pallor.  Psychiatric: She has a normal mood and affect. Her behavior is normal. Judgment and thought content normal.    Laboratory reports 10/17/11 CBC normal  CMP normal except for creatinine 1.14  Lipids: Total cholesterol 136, triglycerides 78, HDL 52, LDL 68  Vitamin D  50      Assessment & Plan:   Problem List Items Addressed This Visit     ICD-9-CM   Spinal stenosis, unspecified region other than cervical (Chronic)    Chronic low back discomfort related to this problem is stable.    Loss of weight (Chronic)    Etiology has not been determined. I suspect this is a caloric deficiency related to life events prior to her move to this facility several months ago. A close friend  died at that time.    Relevant Orders      TSH   Hyperlipidemia (Chronic)    Controlled with current medication    Relevant Orders      Lipid panel   Personal history of fall (Chronic)    Patient does not feel that she is at high risk for further falls. She is stable with her walking. She does not want to use an appliance.    Hypertension - Primary    Stable on current medications    Relevant Orders      CMP with eGFR   Nontoxic uninodular goiter    Chronic condition that needs only observation    Relevant Orders      TSH   Supraventricular premature beats    Asymptomatic    Relevant Orders      EKG 12-Lead   Sprain of ankle, left    Improving slowly over the last 2 weeks. She is not interested in x-rays. She is mobile enough on her foot but I do not think splints or casts are necessary.    Impacted cerumen    Disimpacted with alligator forceps.      Patient will return in 4 months for an extended visit Labs done prior to visit include lipids, CMP, TSH, EKG.

## 2012-09-16 ENCOUNTER — Other Ambulatory Visit: Payer: Self-pay | Admitting: Geriatric Medicine

## 2012-09-16 MED ORDER — ROSUVASTATIN CALCIUM 10 MG PO TABS: 10.0000 mg | ORAL_TABLET | Freq: Every day | ORAL | Status: DC

## 2012-11-26 ENCOUNTER — Non-Acute Institutional Stay: Payer: Medicare Other | Admitting: Internal Medicine

## 2012-11-26 ENCOUNTER — Encounter: Payer: Self-pay | Admitting: Internal Medicine

## 2012-11-26 VITALS — BP 132/72 | HR 72 | Temp 98.2°F | Ht 62.0 in | Wt 111.0 lb

## 2012-11-26 DIAGNOSIS — I10 Essential (primary) hypertension: Secondary | ICD-10-CM

## 2012-11-26 DIAGNOSIS — E785 Hyperlipidemia, unspecified: Secondary | ICD-10-CM

## 2012-11-26 DIAGNOSIS — E041 Nontoxic single thyroid nodule: Secondary | ICD-10-CM

## 2012-11-26 DIAGNOSIS — R634 Abnormal weight loss: Secondary | ICD-10-CM

## 2012-11-26 NOTE — Progress Notes (Signed)
Subjective:    Patient ID: [name], female    DOB: [birth year], 79 y.o.   MRN: [id number]  HPI Hypertension: Controlled  Nontoxic uninodular goiter: Palpable goiter. Nontender. No difficulty swallowing.  Loss of weight: Stabilized  Hyperlipidemia: Under excellent control    Past Medical History  Diagnosis Date  . Vitamin D deficiency 04/25/2011  . Acute upper respiratory infections of unspecified site 2013  . Loss of weight 04/25/2011  . Other drug allergy 04/04/2011  . Cellulitis and abscess of hand, except fingers and thumb 10/04/2010  . Urinary tract infection, site not specified   . Pain in limb 2012  . Urticaria, unspecified 11/01/2008  . Myalgia and myositis, unspecified 01/07/2007  . Supraventricular premature beats 10/2006  . Hyperlipidemia 2007  . Hypertension 2007  . 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 Spinal stenosis, unspecified region other than cervical 2002  . Osteoarthrosis, unspecified whether generalized or localized, unspecified site 07/22/2000  . Nontoxic uninodular goiter 08/15/1998  . Diaphragmatic hernia without mention of obstruction or gangrene 08/15/1998  . Diverticulosis of colon (without mention of hemorrhage) 2000  . Degeneration of intervertebral disc, site unspecified 2000  . Osteoporosis 2000  . Personal history of fall 2012  . Sprain of ankle, left   . Impacted cerumen    Past Surgical History  Procedure Laterality Date  . Laryngoscopy  1987    and biopsy  Dr. Haroldine Laws  . Back surgery  1990    HNP L3-4 Dr. Lynnette Caffey  . Facial cosmetic surgery  1995    Dr. Charolotte Eke  . Colonoscopy  1992    acute segmental colitis Dr. Russella Dar   PROCEDURES 1980-Thyroid uptake scan:normal 1984-Pelvic ultrasound:normal 1984-Barium swallow:normal 1990-CT lumbar spine: HNP L3-4 1992- Nuclear bone scan:Increased uptake symphysis pubis, osteosclerosis of the right sacral wing. 1992-Colonoscopy-Dr.Stark: Acute segmental  colitis 1998-1994-1990-Bone density:Osteoporosis 2002-MRI of LS-Mod.spinal stenosis L3-4; rotoscoliosis; OOP; facet arthritis 2004-Epidural injection 2006-Dexa-Dr.Ramos:stable 2005-Abdominal ultrasound:normal 2005-CXR:negative 2005-Mammogram:normal 2005-[facility] spine [facility] 2006 Mammogram Normal  2008-Venous Doppler:Right and Left lower extremities:No evidence of thrombus or thrombophlebitis 2008-Bone Density 2008-Mammogram 2008-MRI of Brain:No acute intracranial abnormality, Chronic small vessel disease and generalized volume loss. 2008-Chest X-Ray:No acute findings 2008-CT of head:No acute abnormality, Stable mild diffuse cerebral and cerebellar atrophy, Stable mild to moderate chronic small vessel white matter ischemic changes in both hemispheres, Old bilateral lacunar infarcts. 10/2006- EKG- normal 06/2007- Mammogram- negative 07/18/2008 Bone Density Osteoporosis 07/18/2008 Mammogram negative 08/06/2008 Right bursa injection Dr. Darrelyn Hillock 12/05/2009 Mammogram negative refused any future colonoscopy  07/19/10 NONINVASIVE PHYSIOLOGIC VASCULAR STUDY OF BILATERAL LOWER  EXTREMITIES: No evidence of hemodynamically significant lower extremity  arterial occlusive disease at rest   07/31/10 CERVICAL SPINE:CERVICAL SPINE - COMPLETE 4+ VIEW:   1.  Moderate to severe multilevel degenerative disc disease as  described above.  Bony neural foraminal narrowing is present on the  left at C4-5, C5-6, and C6-7 and on the right at C6-7.  2.  Minimal retrolisthesis of C4 and C5 (approximately 1 mm) is  likely due to degenerative change.   CONSULTANTS Ortho: Gioffre/ Ramos Neurosurg: Blomquist GI: Russella Dar ENT: Haroldine Laws    Current Outpatient Prescriptions on File Prior to Visit  Medication Sig Dispense Refill  . amLODipine (NORVASC) 5 MG tablet Take 5 mg by mouth daily.        Marland Kitchen aspirin 81 MG tablet Take 81 mg by mouth daily.        . cholecalciferol (VITAMIN D) 1000 UNITS tablet Take 1,000 Units by  mouth daily. Take one tablet a daily      . HYDROcodone-acetaminophen (NORCO/VICODIN) 5-325 MG per tablet 1 tablet. Three times a day as needed for pain      . Magnesium Gluconate 500 (27 MG) MG TABS Take 1 tablet by mouth. Take one tablet once daily      . multivitamin (THERAGRAN) per tablet Take 1 tablet by mouth daily.        . Omega-3 Fatty Acids (FISH OIL MAXIMUM STRENGTH) 1200 MG CAPS Take 1 each by mouth. Take one daily      . rosuvastatin (CRESTOR) 10 MG tablet Take 1 tablet (10 mg total) by mouth daily.  30 tablet  3   No current facility-administered medications on file prior to visit.   Allergies  Allergen Reactions  . Doxycycline   . Lipitor [Atorvastatin]    Ms. Champagne had no medications administered during this visit.  Family Status  Relation Status Death Age  . Mother Deceased 4  . Father Deceased 37  . Brother Alive   . Sister Deceased   . Son Alive   . Brother Deceased     myelofibrosis  . Brother Deceased     suicide  . Son Alive    Family History  Problem Relation Age of Onset  . Stroke Mother   . Stroke Father   . Hypertension Brother    History   Social History  . Marital Status: Widowed    Spouse Name: N/A    Number of Children: N/A  . Years of Education: N/A   Occupational History  . retired Film/video editor    Social History Main Topics  . Smoking status: Never Smoker   . Smokeless tobacco: Never Used  . Alcohol Use: No  . Drug Use: No  . Sexual Activity: No   Other Topics Concern  . None   Social History Narrative   Lives at [facility] since 7/[address]12     Review of Systems  Constitutional: Negative for activity change and appetite change.  HENT:       History of goiter.  Eyes: Negative.   Respiratory: Positive for cough. Negative for shortness of breath.        Produces clear mucus with cough. Chronic hoarseness.  Cardiovascular: Negative for chest pain, palpitations and leg swelling.  Gastrointestinal:       Mild  dysphagia  Endocrine: Negative.   Genitourinary: Negative.   Musculoskeletal: Positive for back pain.  Skin: Negative.   Neurological:       Remote hx CVA.  Psychiatric/Behavioral: Negative.        Objective:   Physical Exam  Constitutional: She is oriented to person, place, and time. She appears well-developed. No distress.  Thin  HENT:  Head: Normocephalic.  Loss of hearing  Eyes: Conjunctivae and EOM are normal. Pupils are equal, round, and reactive to light.  Neck: Neck supple. No JVD present. No tracheal deviation present. Thyromegaly present.  Cardiovascular: Normal rate, regular rhythm, normal heart sounds and intact distal pulses.  Exam reveals no gallop and no friction rub.   No murmur heard. Bilateral mild varicose veins.  Pulmonary/Chest: Breath sounds normal. No respiratory distress. She has no wheezes. She has no rales.  Chronic hoarseness.  Abdominal: Bowel sounds are normal. She exhibits no distension and no mass. There is no tenderness.  Musculoskeletal: Normal range of motion. She exhibits no edema and no tenderness.  Lymphadenopathy:    She has no cervical adenopathy.  Neurological:  She is alert and oriented to person, place, and time. She has normal reflexes. No cranial nerve deficit. She exhibits normal muscle tone. Coordination normal.  Skin: No rash noted. No erythema. No pallor.  Psychiatric: She has a normal mood and affect. Her behavior is normal. Judgment and thought content normal.     LABS REVIEWED. 11/17/12 CMP: Normal  Lipids: TC 111, Trig 60, HDL 58, LDL 41  TSH 1.077     Assessment & Plan:  Hypertension: Controlled  Nontoxic uninodular goiter: Unchanged  Loss of weight: Stable  Hyperlipidemia: Well controlled

## 2012-11-28 NOTE — Patient Instructions (Signed)
Continue current medications. 

## 2012-12-17 ENCOUNTER — Other Ambulatory Visit: Payer: Self-pay | Admitting: *Deleted

## 2012-12-17 MED ORDER — ROSUVASTATIN CALCIUM 10 MG PO TABS: ORAL_TABLET | ORAL | Status: DC

## 2013-04-13 ENCOUNTER — Other Ambulatory Visit: Payer: Self-pay | Admitting: Nurse Practitioner

## 2013-05-27 ENCOUNTER — Encounter: Payer: Self-pay | Admitting: Internal Medicine

## 2013-07-08 ENCOUNTER — Non-Acute Institutional Stay: Payer: Medicare Other | Admitting: Internal Medicine

## 2013-07-08 ENCOUNTER — Encounter: Payer: Self-pay | Admitting: Internal Medicine

## 2013-07-08 VITALS — BP 122/62 | HR 60 | Wt 114.0 lb

## 2013-07-08 DIAGNOSIS — M25559 Pain in unspecified hip: Secondary | ICD-10-CM | POA: Insufficient documentation

## 2013-07-08 DIAGNOSIS — I10 Essential (primary) hypertension: Secondary | ICD-10-CM

## 2013-07-08 DIAGNOSIS — E041 Nontoxic single thyroid nodule: Secondary | ICD-10-CM

## 2013-07-08 DIAGNOSIS — R634 Abnormal weight loss: Secondary | ICD-10-CM

## 2013-07-08 DIAGNOSIS — E785 Hyperlipidemia, unspecified: Secondary | ICD-10-CM

## 2013-07-08 NOTE — Patient Instructions (Signed)
Continue current medications. 

## 2013-07-08 NOTE — Progress Notes (Signed)
Patient ID: [name], female   DOB: 1925/01/25, 80 y.o.   MRN: [id number]    Location:  [facility]  Place of Service: CLINIC    Allergies  Allergen Reactions  . Doxycycline   . Lipitor [Atorvastatin]     Chief Complaint  Patient presents with  . Medical Management of Chronic Issues    blood pressure, goiter, weight loss, cholesterol    HPI:  Hypertension: controlled  Nontoxic uninodular goiter: small. Non tender.  Hyperlipidemia: normal on Crestor in Sept 2014  Loss of weight: regained 3# since last visit  Pain in hip: this is her worst problem recently. Has seen Dr. Nelva Bush. He thinks the problem is related to her lumbar spinal stenosis. Did no improve with 1 week of Prednisone dose pack. Has has epidural injections in the past that did not seem to help.    Medications: Patient's Medications  New Prescriptions   No medications on file  Previous Medications   AMLODIPINE (NORVASC) 5 MG TABLET    TAKE 1 TABLET DAILY FOR BLOOD PRESSURE.   ASPIRIN 81 MG TABLET    Take 81 mg by mouth daily.     CHOLECALCIFEROL (VITAMIN D) 1000 UNITS TABLET    Take 1,000 Units by mouth daily. Take one tablet a daily   DOCUSATE SODIUM (COLACE) 100 MG CAPSULE    Take 100 mg by mouth. Take one tablet 3 times daily to prevent constipation   HYDROCODONE-ACETAMINOPHEN (NORCO/VICODIN) 5-325 MG PER TABLET    1 tablet. Three times a day as needed for pain   MAGNESIUM GLUCONATE 500 (27 MG) MG TABS    Take 1 tablet by mouth. Take one tablet once daily   MULTIVITAMIN (THERAGRAN) PER TABLET    Take 1 tablet by mouth daily.     OMEGA-3 FATTY ACIDS (FISH OIL MAXIMUM STRENGTH) 1200 MG CAPS    Take 1 each by mouth. Take one daily   ROSUVASTATIN (CRESTOR) 10 MG TABLET    Take one tablet by mouth once daily to lower cholesterol   VITAMIN E 400 UNIT CAPSULE    Take 400 Units by mouth daily.  Modified Medications   No medications on file  Discontinued Medications   No medications on file     Review of  Systems  Constitutional: Negative for activity change and appetite change.  HENT:       History of goiter.  Eyes: Negative.   Respiratory: Positive for cough. Negative for shortness of breath.        Produces clear mucus with cough. Chronic hoarseness.  Cardiovascular: Negative for chest pain, palpitations and leg swelling.  Gastrointestinal:       Mild dysphagia  Endocrine: Negative.   Genitourinary: Negative.   Musculoskeletal: Positive for back pain.       Pains radiate from low back down the right leg.   Skin: Negative.   Neurological:       Remote hx CVA.  Psychiatric/Behavioral: Negative.     Filed Vitals:   07/08/13 1603  BP: 122/62  Pulse: 60  Weight: 114 lb (51.71 kg)   Physical Exam  Constitutional: She is oriented to person, place, and time. She appears well-developed. No distress.  Thin  HENT:  Head: Normocephalic.  Loss of hearing  Eyes: Conjunctivae and EOM are normal. Pupils are equal, round, and reactive to light.  Neck: Neck supple. No JVD present. No tracheal deviation present. Thyromegaly present.  Cardiovascular: Normal rate, regular rhythm, normal heart sounds and intact distal pulses.  Exam reveals no gallop and no friction rub.   No murmur heard. Bilateral mild varicose veins.  Pulmonary/Chest: Breath sounds normal. No respiratory distress. She has no wheezes. She has no rales.  Chronic hoarseness.  Abdominal: Bowel sounds are normal. She exhibits no distension and no mass. There is no tenderness.  Musculoskeletal: Normal range of motion. She exhibits no edema and no tenderness.  Lymphadenopathy:    She has no cervical adenopathy.  Neurological: She is alert and oriented to person, place, and time. She has normal reflexes. No cranial nerve deficit. She exhibits normal muscle tone. Coordination normal.  Skin: No rash noted. No erythema. No pallor.  Psychiatric: She has a normal mood and affect. Her behavior is normal. Judgment and thought content  normal.     Labs reviewed: No visits with results within 3 Month(s) from this visit. Latest known visit with results is:  Admission on 09/18/2010, Discharged on 09/20/2010  Component Date Value Ref Range Status  . Sodium 09/18/2010 132* 135 - 145 mEq/L Final  . Potassium 09/18/2010 3.7  3.5 - 5.1 mEq/L Final  . Chloride 09/18/2010 97  96 - 112 mEq/L Final  . CO2 09/18/2010 27  19 - 32 mEq/L Final  . Glucose, Bld 09/18/2010 145* 70 - 99 mg/dL Final  . BUN 09/18/2010 11  6 - 23 mg/dL Final  . Creatinine, Ser 09/18/2010 0.80  0.50 - 1.10 mg/dL Final   **Please note change in reference range.**  . Calcium 09/18/2010 8.9  8.4 - 10.5 mg/dL Final  . Total Protein 09/18/2010 6.6  6.0 - 8.3 g/dL Final  . Albumin 09/18/2010 3.3* 3.5 - 5.2 g/dL Final  . AST 09/18/2010 21  0 - 37 U/L Final  . ALT 09/18/2010 13  0 - 35 U/L Final  . Alkaline Phosphatase 09/18/2010 61  39 - 117 U/L Final  . Total Bilirubin 09/18/2010 0.3  0.3 - 1.2 mg/dL Final  . GFR calc non Af Amer 09/18/2010 >60  >60 mL/min Final  . GFR calc Af Amer 09/18/2010 >60  >60 mL/min Final   Comment:                                 The eGFR has been calculated                          using the MDRD equation.                          This calculation has not been                          validated in all clinical                          situations.                          eGFR's persistently                          <60 mL/min signify                          possible Chronic Kidney Disease.  . Total CK 09/18/2010 32  7 - 177 U/L Final  . WBC 09/19/2010 4.5  4.0 - 10.5 K/uL Final  . RBC 09/19/2010 4.04  3.87 - 5.11 MIL/uL Final  . Hemoglobin 09/19/2010 11.7* 12.0 - 15.0 g/dL Final  . HCT 09/19/2010 35.4* 36.0 - 46.0 % Final  . MCV 09/19/2010 87.6  78.0 - 100.0 fL Final  . MCH 09/19/2010 29.0  26.0 - 34.0 pg Final  . MCHC 09/19/2010 33.1  30.0 - 36.0 g/dL Final  . RDW 09/19/2010 13.1  11.5 - 15.5 % Final  . Platelets  09/19/2010 207  150 - 400 K/uL Final  . Sodium 09/19/2010 134* 135 - 145 mEq/L Final  . Potassium 09/19/2010 3.7  3.5 - 5.1 mEq/L Final  . Chloride 09/19/2010 101  96 - 112 mEq/L Final  . CO2 09/19/2010 27  19 - 32 mEq/L Final  . Glucose, Bld 09/19/2010 90  70 - 99 mg/dL Final  . BUN 09/19/2010 13  6 - 23 mg/dL Final  . Creatinine, Ser 09/19/2010 0.99  0.50 - 1.10 mg/dL Final   **Please note change in reference range.**  . Calcium 09/19/2010 9.0  8.4 - 10.5 mg/dL Final  . GFR calc non Af Amer 09/19/2010 53* >60 mL/min Final  . GFR calc Af Amer 09/19/2010 >60  >60 mL/min Final   Comment:                                 The eGFR has been calculated                          using the MDRD equation.                          This calculation has not been                          validated in all clinical                          situations.                          eGFR's persistently                          <60 mL/min signify                          possible Chronic Kidney Disease.  . Total CK 09/19/2010 26  7 - 177 U/L Final  . Vitamin B-12 09/19/2010 1122* 211 - 911 pg/mL Final  . Folate 09/19/2010 >20.0   Final   Comment: (NOTE)                          Reference Ranges                                 Deficient:       0.4 - 3.3 ng/mL                                 Indeterminate:  3.4 - 5.4 ng/mL                                 Normal:              > 5.4 ng/mL  . Ferritin 09/19/2010 87  10 - 291 ng/mL Final  . Iron 09/19/2010 18* 42 - 135 ug/dL Final  . TIBC 09/19/2010 207* 250 - 470 ug/dL Final  . Saturation Ratios 09/19/2010 9* 20 - 55 % Final  . UIBC 09/19/2010 189   Final  . Neutrophils Relative % 09/20/2010 23* 43 - 77 % Final  . Neutro Abs 09/20/2010 1.2* 1.7 - 7.7 K/uL Final  . Lymphocytes Relative 09/20/2010 63* 12 - 46 % Final  . Lymphs Abs 09/20/2010 3.2  0.7 - 4.0 K/uL Final  . Monocytes Relative 09/20/2010 11  3 - 12 % Final  . Monocytes Absolute 09/20/2010 0.5   0.1 - 1.0 K/uL Final  . Eosinophils Relative 09/20/2010 3  0 - 5 % Final  . Eosinophils Absolute 09/20/2010 0.1  0.0 - 0.7 K/uL Final  . Basophils Relative 09/20/2010 1  0 - 1 % Final  . Basophils Absolute 09/20/2010 0.0  0.0 - 0.1 K/uL Final  . WBC 09/20/2010 5.0  4.0 - 10.5 K/uL Final  . RBC 09/20/2010 4.07  3.87 - 5.11 MIL/uL Final  . Hemoglobin 09/20/2010 12.0  12.0 - 15.0 g/dL Final  . HCT 09/20/2010 35.7* 36.0 - 46.0 % Final  . MCV 09/20/2010 87.7  78.0 - 100.0 fL Final  . MCH 09/20/2010 29.5  26.0 - 34.0 pg Final  . MCHC 09/20/2010 33.6  30.0 - 36.0 g/dL Final  . RDW 09/20/2010 13.0  11.5 - 15.5 % Final  . Platelets 09/20/2010 204  150 - 400 K/uL Final  . Sodium 09/20/2010 136  135 - 145 mEq/L Final  . Potassium 09/20/2010 3.7  3.5 - 5.1 mEq/L Final  . Chloride 09/20/2010 101  96 - 112 mEq/L Final  . CO2 09/20/2010 28  19 - 32 mEq/L Final  . Glucose, Bld 09/20/2010 87  70 - 99 mg/dL Final  . BUN 09/20/2010 16  6 - 23 mg/dL Final  . Creatinine, Ser 09/20/2010 1.03  0.50 - 1.10 mg/dL Final   **Please note change in reference range.**  . Calcium 09/20/2010 9.1  8.4 - 10.5 mg/dL Final  . GFR calc non Af Amer 09/20/2010 51* >60 mL/min Final  . GFR calc Af Amer 09/20/2010 >60  >60 mL/min Final   Comment:                                 The eGFR has been calculated                          using the MDRD equation.                          This calculation has not been                          validated in all clinical                          situations.  eGFR's persistently                          <60 mL/min signify                          possible Chronic Kidney Disease.      Assessment/Plan  Hypertension: controlled  Nontoxic uninodular goiter: barely palpable  Hyperlipidemia: recheck next visit  Loss of weight: slightly improved with 3# gain in 6 months  Pain in hip: related to lumbar spinal stenosis. She will continue with Dr.  Nelva Bush.

## 2013-10-04 ENCOUNTER — Other Ambulatory Visit: Payer: Self-pay | Admitting: Internal Medicine

## 2013-12-06 ENCOUNTER — Other Ambulatory Visit: Payer: Self-pay | Admitting: Nurse Practitioner

## 2014-01-11 LAB — LIPID PANEL
CHOLESTEROL: 103 mg/dL (ref 0–200)
HDL: 46 mg/dL (ref 35–70)
LDL Cholesterol: 42 mg/dL
LDl/HDL Ratio: 2.2
TRIGLYCERIDES: 75 mg/dL (ref 40–160)

## 2014-01-11 LAB — BASIC METABOLIC PANEL
BUN: 12 mg/dL (ref 4–21)
Creatinine: 1 mg/dL (ref 0.5–1.1)
GLUCOSE: 87 mg/dL
Potassium: 4.4 mmol/L (ref 3.4–5.3)
SODIUM: 138 mmol/L (ref 137–147)

## 2014-01-11 LAB — HEPATIC FUNCTION PANEL
ALK PHOS: 65 U/L (ref 25–125)
ALT: 8 U/L (ref 7–35)
AST: 18 U/L (ref 13–35)
Bilirubin, Total: 0.4 mg/dL

## 2014-01-20 ENCOUNTER — Encounter: Payer: Self-pay | Admitting: Internal Medicine

## 2014-01-20 ENCOUNTER — Non-Acute Institutional Stay: Payer: Medicare Other | Admitting: Internal Medicine

## 2014-01-20 VITALS — BP 130/68 | HR 66 | Temp 97.7°F | Ht 62.0 in | Wt 113.0 lb

## 2014-01-20 DIAGNOSIS — E785 Hyperlipidemia, unspecified: Secondary | ICD-10-CM

## 2014-01-20 DIAGNOSIS — M25559 Pain in unspecified hip: Secondary | ICD-10-CM

## 2014-01-20 DIAGNOSIS — I10 Essential (primary) hypertension: Secondary | ICD-10-CM

## 2014-01-20 DIAGNOSIS — R350 Frequency of micturition: Secondary | ICD-10-CM

## 2014-01-20 DIAGNOSIS — R634 Abnormal weight loss: Secondary | ICD-10-CM

## 2014-01-20 DIAGNOSIS — E041 Nontoxic single thyroid nodule: Secondary | ICD-10-CM

## 2014-01-20 HISTORY — DX: Frequency of micturition: R35.0

## 2014-01-20 NOTE — Progress Notes (Signed)
Patient ID: [name], female   DOB: 09-Oct-1924, 80 y.o.   MRN: [id number]    HISTORY AND PHYSICAL  Location:  [facility]    Place of Service: Clinic (12)   Extended Emergency Contact Information Primary Emergency Contact: Bosques,Ruth          [place] Keel [place] Mobile Phone: [phone] Relation: Daughter Secondary Emergency Contact: Lebron Conners [place] Home Phone: [phone] Relation: Son   Advanced Directive information Does patient have an advance directive?: Yes, Type of Advance Directive: Living will, Does patient want to make changes to advanced directive?: No - Patient declined   Chief Complaint  Patient presents with  . Annual Exam    Comprehensive exam : blood pressure, goiter, cholesterol    HPI:  Essential hypertension: controlled  Hyperlipidemia: controlled  Loss of weight: weight has been stable recently  Nontoxic uninodular goiter: stable  Pain in hip, unspecified laterality: improved  Urine frequency: denies dysuria. Has had occ incontinence    Past Medical History  Diagnosis Date  . Vitamin D deficiency 04/25/2011  . Acute upper respiratory infections of unspecified site 2013  . Loss of weight 04/25/2011  . Other drug allergy(995.27) 04/04/2011  . Cellulitis and abscess of hand, except fingers and thumb 10/04/2010  . Urinary tract infection, site not specified   . Pain in limb 2012  . Urticaria, unspecified 11/01/2008  . Myalgia and myositis, unspecified 01/07/2007  . Supraventricular premature beats 10/2006  . Hyperlipidemia 2007  . Hypertension 2007  . 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 Spinal stenosis, unspecified region other than cervical 2002  . Osteoarthrosis, unspecified whether generalized or localized, unspecified site 07/22/2000  . Nontoxic uninodular goiter 08/15/1998  . Diaphragmatic hernia without mention of obstruction or gangrene 08/15/1998  .  Diverticulosis of colon (without mention of hemorrhage) 2000  . Degeneration of intervertebral disc, site unspecified 2000  . Osteoporosis 2000  . Personal history of fall 2012  . Sprain of ankle, left   . Impacted cerumen   . Urine frequency 01/20/2014    Past Surgical History  Procedure Laterality Date  . Laryngoscopy  1987    and biopsy  Dr. Haroldine Laws  . Back surgery  1990    HNP L3-4 Dr. Lynnette Caffey  . Facial cosmetic surgery  1995    Dr. Charolotte Eke  . Colonoscopy  1992    acute segmental colitis Dr. Russella Dar    Patient Care Team: Kimber Relic, MD as PCP - General (Internal Medicine) Kimber Relic, MD as PCP - Internal Medicine (Geriatric Medicine) Myrtis Hopping, MD as Consulting Physician (Neurosurgery) Keturah Barre, MD as Consulting Physician (Otolaryngology) Meryl Dare, MD as Consulting Physician (Gastroenterology) Elliot Cousin, OD as Consulting Physician (Optometry) Shayne Alken, MD as Consulting Physician (Physical Medicine and Rehabilitation) Jacki Cones, MD as Consulting Physician (Orthopedic Surgery) [facility]  History   Social History  . Marital Status: Widowed    Spouse Name: N/A    Number of Children: N/A  . Years of Education: N/A   Occupational History  . retired Film/video editor    Social History Main Topics  . Smoking status: Never Smoker   . Smokeless tobacco: Never Used  . Alcohol Use: No  . Drug Use: No  . Sexual Activity: No   Other Topics Concern  . Not on file   Social History Narrative   Lives at [facility] since 7/[address]12   Widowed  Never smoke   No alcohol   Exercise none   Living Will , POA               reports that she has never smoked. She has never used smokeless tobacco. She reports that she does not drink alcohol or use illicit drugs.  Family History  Problem Relation Age of Onset  . Stroke Mother   . Stroke Father   . Hypertension Brother    Family Status  Relation  Status Death Age  . Mother Deceased 67  . Father Deceased 47  . Brother Alive   . Sister Deceased   . Son Alive   . Brother Deceased     myelofibrosis  . Brother Deceased     suicide  . Son Alive     Immunization History  Administered Date(s) Administered  . Influenza Whole 12/10/2011, 12/23/2012  . Influenza-Unspecified 12/24/2013  . Pneumococcal Polysaccharide-23 03/12/1991  . Td 04/25/2011  . Zoster 09/04/2005    Allergies  Allergen Reactions  . Doxycycline   . Lipitor [Atorvastatin]     Medications: Patient's Medications  New Prescriptions   No medications on file  Previous Medications   AMLODIPINE (NORVASC) 5 MG TABLET    TAKE 1 TABLET DAILY FOR BLOOD PRESSURE.   ASPIRIN 81 MG TABLET    Take 81 mg by mouth daily.     CHOLECALCIFEROL (VITAMIN D) 1000 UNITS TABLET    Take 1,000 Units by mouth daily. Take one tablet a daily   CRESTOR 10 MG TABLET    TAKE 1 TABLET DAILY TO LOWER CHOLESTEROL.   DOCUSATE SODIUM (COLACE) 100 MG CAPSULE    Take 100 mg by mouth. Take one tablet 3 times daily to prevent constipation   HYDROCODONE-ACETAMINOPHEN (NORCO/VICODIN) 5-325 MG PER TABLET    1 tablet. Three times a day as needed for pain   MAGNESIUM GLUCONATE 500 (27 MG) MG TABS    Take 1 tablet by mouth. Take one tablet once daily   MULTIVITAMIN (THERAGRAN) PER TABLET    Take 1 tablet by mouth daily.     OMEGA-3 FATTY ACIDS (FISH OIL MAXIMUM STRENGTH) 1200 MG CAPS    Take 1 each by mouth. Take one daily   VITAMIN E 400 UNIT CAPSULE    Take 400 Units by mouth daily.  Modified Medications   No medications on file  Discontinued Medications   No medications on file    Review of Systems  Constitutional: Negative for activity change and appetite change.  HENT:       History of goiter.  Eyes: Negative.   Respiratory: Positive for cough. Negative for shortness of breath.        Produces clear mucus with cough. Chronic hoarseness.  Cardiovascular: Negative for chest pain,  palpitations and leg swelling.  Gastrointestinal:       Mild dysphagia  Endocrine: Negative.   Genitourinary: Negative.   Musculoskeletal: Positive for back pain.       Pains radiate from low back down the right leg.   Skin: Negative.   Neurological:       Remote hx CVA.  Psychiatric/Behavioral: Negative.     BP 130/68 mmHg  Pulse 66  Temp(Src) 97.7 F (36.5 C) (Oral)  Ht 5\' 2"  (1.575 m)  Wt 113 lb (51.256 kg)  BMI 20.66 kg/m2  SpO2 97%   Body mass index is 20.66 kg/(m^2).  Physical Exam  Constitutional: She is oriented to person, place, and time. She appears well-developed. No  distress.  Thin  HENT:  Head: Normocephalic.  Loss of hearing  Eyes: Conjunctivae and EOM are normal. Pupils are equal, round, and reactive to light.  Neck: Neck supple. No JVD present. No tracheal deviation present. Thyromegaly present.  Cardiovascular: Normal rate, regular rhythm, normal heart sounds and intact distal pulses.  Exam reveals no gallop and no friction rub.   No murmur heard. Bilateral mild varicose veins.  Pulmonary/Chest: Breath sounds normal. No respiratory distress. She has no wheezes. She has no rales.  Chronic hoarseness.  Abdominal: Bowel sounds are normal. She exhibits no distension and no mass. There is no tenderness.  Musculoskeletal: Normal range of motion. She exhibits no edema or tenderness.  Lymphadenopathy:    She has no cervical adenopathy.  Neurological: She is alert and oriented to person, place, and time. She has normal reflexes. No cranial nerve deficit. She exhibits normal muscle tone. Coordination normal.  Skin: No rash noted. No erythema. No pallor.  Psychiatric: She has a normal mood and affect. Her behavior is normal. Judgment and thought content normal.     Labs reviewed: Nursing Home on 01/20/2014  Component Date Value Ref Range Status  . Glucose 01/11/2014 87   Final  . BUN 01/11/2014 12  4 - 21 mg/dL Final  . Creatinine 90/30/0923 1.0  0.5 - 1.1  mg/dL Final  . Potassium 30/09/6224 4.4  3.4 - 5.3 mmol/L Final  . Sodium 01/11/2014 138  137 - 147 mmol/L Final  . LDl/HDL Ratio 01/11/2014 2.2   Final  . Triglycerides 01/11/2014 75  40 - 160 mg/dL Final  . Cholesterol 33/35/4562 103  0 - 200 mg/dL Final  . HDL 56/38/9373 46  35 - 70 mg/dL Final  . LDL Cholesterol 01/11/2014 42   Final  . Alkaline Phosphatase 01/11/2014 65  25 - 125 U/L Final  . ALT 01/11/2014 8  7 - 35 U/L Final  . AST 01/11/2014 18  13 - 35 U/L Final  . Bilirubin, Total 01/11/2014 0.4   Final    Assessment/Plan  1. Essential hypertension controlled  2. Hyperlipidemia controlled  3. Loss of weight stable  4. Nontoxic uninodular goiter unchanged  5. Pain in hip, unspecified laterality improved  6. Urine frequency -UA and culture, future

## 2014-01-24 ENCOUNTER — Encounter: Payer: Self-pay | Admitting: Internal Medicine

## 2014-01-28 ENCOUNTER — Telehealth: Payer: Self-pay

## 2014-01-28 MED ORDER — AMOXICILLIN-POT CLAVULANATE 500-125 MG PO TABS: ORAL_TABLET | ORAL | Status: DC

## 2014-01-28 NOTE — Telephone Encounter (Signed)
Left message on voicemail for Lillie Columbia, RN at [facility] with recommendations. Faxed report with Dr.Reed's response/recommendations to [phone].  I tried to call patient, number invalid.

## 2014-02-01 NOTE — Telephone Encounter (Signed)
Opened in error

## 2014-02-07 ENCOUNTER — Encounter: Payer: Self-pay | Admitting: Internal Medicine

## 2014-03-07 ENCOUNTER — Other Ambulatory Visit: Payer: Self-pay | Admitting: Internal Medicine

## 2014-03-15 ENCOUNTER — Encounter: Payer: Self-pay | Admitting: Internal Medicine

## 2014-03-15 ENCOUNTER — Ambulatory Visit (INDEPENDENT_AMBULATORY_CARE_PROVIDER_SITE_OTHER): Payer: Medicare Other | Admitting: Internal Medicine

## 2014-03-15 VITALS — BP 124/70 | HR 89 | Temp 97.8°F | Resp 18 | Ht 62.0 in | Wt 110.4 lb

## 2014-03-15 DIAGNOSIS — R300 Dysuria: Secondary | ICD-10-CM

## 2014-03-15 LAB — POCT URINALYSIS DIPSTICK
Bilirubin, UA: NEGATIVE
Glucose, UA: NEGATIVE
KETONES UA: NEGATIVE
Leukocytes, UA: NEGATIVE
Nitrite, UA: NEGATIVE
PH UA: 7.5
SPEC GRAV UA: 1.01
UROBILINOGEN UA: NEGATIVE

## 2014-03-15 MED ORDER — LEVOFLOXACIN 500 MG PO TABS: 500.0000 mg | ORAL_TABLET | Freq: Every day | ORAL | Status: DC

## 2014-03-15 MED ORDER — PHENAZOPYRIDINE HCL 97.2 MG PO TABS: 97.0000 mg | ORAL_TABLET | Freq: Three times a day (TID) | ORAL | Status: DC | PRN

## 2014-03-15 NOTE — Addendum Note (Signed)
Addended by: Marvia Pickles on: 03/15/2014 10:21 AM   Modules accepted: Orders

## 2014-03-15 NOTE — Progress Notes (Signed)
Patient ID: [name], female   DOB: 1924/07/03, 81 y.o.   MRN: [id number]  [facility]   Chief Complaint  Patient presents with  . Acute Visit    lower abdominal pain, dysuria, incomplete emptying of the bladder   Allergies  Allergen Reactions  . Doxycycline   . Lipitor [Atorvastatin]     HPI 81 y/o female patient is here with complaint of dysuria, burning with urination and lower abdominal pain x 5 days. She had UTI and was treated with antibiotics in November. She denies any vaginal discharge. She is keeping herself hydrated and drinking cranberry juice. She denies any nausea or vomiting. She feels she is unable to empty the bladder completely.  ROS Denies fever or chills Denies heartburn, chest pain or dyspnea Denies nausea or vomiting or upper quadrant abdominal pain Has chronic low back pain Denies flank pain Has some discomfort around groin area Denies hematuria  Past Medical History  Diagnosis Date  . Vitamin D deficiency 04/25/2011  . Acute upper respiratory infections of unspecified site 2013  . Loss of weight 04/25/2011  . Other drug allergy(995.27) 04/04/2011  . Cellulitis and abscess of hand, except fingers and thumb 10/04/2010  . Urinary tract infection, site not specified   . Pain in limb 2012  . Urticaria, unspecified 11/01/2008  . Myalgia and myositis, unspecified 01/07/2007  . Supraventricular premature beats 10/2006  . Hyperlipidemia 2007  . Hypertension 2007  . 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 Spinal stenosis, unspecified region other than cervical 2002  . Osteoarthrosis, unspecified whether generalized or localized, unspecified site 07/22/2000  . Nontoxic uninodular goiter 08/15/1998  . Diaphragmatic hernia without mention of obstruction or gangrene 08/15/1998  . Diverticulosis of colon (without mention of hemorrhage) 2000  . Degeneration of intervertebral disc, site unspecified 2000  . Osteoporosis 2000  .  Personal history of fall 2012  . Sprain of ankle, left   . Impacted cerumen   . Urine frequency 01/20/2014   Current Outpatient Prescriptions on File Prior to Visit  Medication Sig Dispense Refill  . amLODipine (NORVASC) 5 MG tablet TAKE 1 TABLET DAILY FOR BLOOD PRESSURE. 90 tablet 1  . aspirin 81 MG tablet Take 81 mg by mouth daily.      . cholecalciferol (VITAMIN D) 1000 UNITS tablet Take 1,000 Units by mouth daily. Take one tablet a daily    . CRESTOR 10 MG tablet TAKE 1 TABLET DAILY TO LOWER CHOLESTEROL. 90 tablet 1  . docusate sodium (COLACE) 100 MG capsule Take 100 mg by mouth. Take one tablet 3 times daily to prevent constipation    . HYDROcodone-acetaminophen (NORCO/VICODIN) 5-325 MG per tablet 1 tablet. Three times a day as needed for pain    . Magnesium Gluconate 500 (27 MG) MG TABS Take 1 tablet by mouth. Take one tablet once daily    . multivitamin (THERAGRAN) per tablet Take 1 tablet by mouth daily.      . Omega-3 Fatty Acids (FISH OIL MAXIMUM STRENGTH) 1200 MG CAPS Take 1 each by mouth. Take one daily    . vitamin E 400 UNIT capsule Take 400 Units by mouth daily.     No current facility-administered medications on file prior to visit.    Physical exam BP 124/70 mmHg  Pulse 89  Temp(Src) 97.8 F (36.6 C) (Oral)  Resp 18  Ht 5\' 2"  (1.575 m)  Wt 110 lb 6.4 oz (50.077 kg)  BMI 20.19 kg/m2  SpO2 96%  General- elderly female in no acute distress Head- atraumatic, normocephalic Cardiovascular- normal s1,s2, no murmurs Respiratory- bilateral clear to auscultation, no wheeze, no rhonchi, no crackles Abdomen- bowel sounds present, soft, suprapubic tenderness, no suprapubic distension, no guarding or rigidity, no CVA tenderness, no inguinal lymphadenopathy Musculoskeletal- able to move all 4 extremities Neurological- no focal deficit Psychiatry- alert and oriented to person, place and time, normal mood and affect  Labs  03/15/14 urinanalysis-  negative   Assessment/plan  Dysuria Despite of her u/a being negative, given her suprapubic pain and dysuria and recent hx of UTI, will treat her empirically with levofloxacin 500 mg daily for now for 1 week. Encouraged hydration and cranberry juice. Send urine for culture. If no improvement in symptoms, will get imaging study for abdomen and pelvis to evaluate further. Pt might need a cystoscopy if she continues to have problem emptying her bladder and recurrent UTI. Add pyridium 100 mg tid prn for dysuria

## 2014-03-16 LAB — URINE CULTURE: Organism ID, Bacteria: NO GROWTH

## 2014-03-18 ENCOUNTER — Encounter: Payer: Self-pay | Admitting: Internal Medicine

## 2014-03-22 NOTE — Addendum Note (Signed)
Addended by: Nelda Severe A on: 03/22/2014 01:24 PM   Modules accepted: Orders

## 2014-03-23 ENCOUNTER — Other Ambulatory Visit: Payer: Self-pay | Admitting: *Deleted

## 2014-03-23 DIAGNOSIS — R300 Dysuria: Secondary | ICD-10-CM

## 2014-03-23 DIAGNOSIS — R109 Unspecified abdominal pain: Secondary | ICD-10-CM

## 2014-03-24 ENCOUNTER — Inpatient Hospital Stay: Admission: RE | Admit: 2014-03-24 | Payer: Self-pay | Source: Ambulatory Visit

## 2014-03-30 ENCOUNTER — Other Ambulatory Visit: Payer: Self-pay | Admitting: *Deleted

## 2014-03-30 DIAGNOSIS — R109 Unspecified abdominal pain: Secondary | ICD-10-CM

## 2014-03-31 ENCOUNTER — Ambulatory Visit
Admission: RE | Admit: 2014-03-31 | Discharge: 2014-03-31 | Disposition: A | Discharge status: Home / Self Care | Payer: Medicare Other | Source: Ambulatory Visit | Attending: Internal Medicine | Admitting: Internal Medicine

## 2014-03-31 DIAGNOSIS — R198 Other specified symptoms and signs involving the digestive system and abdomen: Secondary | ICD-10-CM | POA: Diagnosis not present

## 2014-03-31 DIAGNOSIS — R109 Unspecified abdominal pain: Secondary | ICD-10-CM

## 2014-04-04 ENCOUNTER — Other Ambulatory Visit: Payer: Self-pay | Admitting: Internal Medicine

## 2014-05-09 DIAGNOSIS — H2513 Age-related nuclear cataract, bilateral: Secondary | ICD-10-CM | POA: Diagnosis not present

## 2014-05-09 DIAGNOSIS — H3531 Nonexudative age-related macular degeneration: Secondary | ICD-10-CM | POA: Diagnosis not present

## 2014-05-19 DIAGNOSIS — H2511 Age-related nuclear cataract, right eye: Secondary | ICD-10-CM | POA: Diagnosis not present

## 2014-05-19 HISTORY — PX: CATARACT EXTRACTION W/ INTRAOCULAR LENS IMPLANT: SHX1309

## 2014-05-23 DIAGNOSIS — M47816 Spondylosis without myelopathy or radiculopathy, lumbar region: Secondary | ICD-10-CM | POA: Diagnosis not present

## 2014-05-23 DIAGNOSIS — M47812 Spondylosis without myelopathy or radiculopathy, cervical region: Secondary | ICD-10-CM | POA: Diagnosis not present

## 2014-05-23 DIAGNOSIS — Z79891 Long term (current) use of opiate analgesic: Secondary | ICD-10-CM | POA: Diagnosis not present

## 2014-05-23 DIAGNOSIS — G894 Chronic pain syndrome: Secondary | ICD-10-CM | POA: Diagnosis not present

## 2014-07-21 ENCOUNTER — Non-Acute Institutional Stay: Payer: Medicare Other | Admitting: Internal Medicine

## 2014-07-21 ENCOUNTER — Encounter: Payer: Self-pay | Admitting: Internal Medicine

## 2014-07-21 VITALS — BP 120/62 | HR 80 | Temp 98.2°F | Wt 108.0 lb

## 2014-07-21 DIAGNOSIS — R1030 Lower abdominal pain, unspecified: Secondary | ICD-10-CM

## 2014-07-21 DIAGNOSIS — E041 Nontoxic single thyroid nodule: Secondary | ICD-10-CM | POA: Diagnosis not present

## 2014-07-21 DIAGNOSIS — I10 Essential (primary) hypertension: Secondary | ICD-10-CM | POA: Diagnosis not present

## 2014-07-21 DIAGNOSIS — G8929 Other chronic pain: Secondary | ICD-10-CM | POA: Insufficient documentation

## 2014-07-21 DIAGNOSIS — M545 Low back pain, unspecified: Secondary | ICD-10-CM

## 2014-07-21 DIAGNOSIS — M549 Dorsalgia, unspecified: Secondary | ICD-10-CM

## 2014-07-21 NOTE — Progress Notes (Signed)
Patient ID: [name], female   DOB: January 02, 1925, 81 y.o.   MRN: [id number]    [facility]     [facility] of Service[facility] (12[facility]     Allergies  Allergen Reactions  . Doxycycline   . Lipitor [Atorvastatin]     Chief Complaint  Patient presents with  . Medical Management of Chronic Issues    blood pressure, goiter  . Abdominal Pain    after she eats    HPI:  Lower abdominal pain: Pelvic pain, Ongoing since 1/16. Feels like menstrual cramps. "It feels like I have a UTI, but I don't." Feels sensation of incomplete voiding. Pain worsens after meals. Denies nausea, vomiting, dysuria, or diarrhea. Denies pain with passing stool. Seen on 03/15/14 by MD Glade Lloyd for same. Urine and Culture were negative. Renal US done on 03/31/14 - negative. CT abdomen and pelvis was ordered, but was not done.   Essential hypertension: No headaches, chest pain or peripheral edema.  Midline low back pain without sciatica: Chronic low back pain. complaining of worse 'tail bone' pain. Has to shift weight off her tailbone when sitting.   Nontoxic uninodular goiter: Stable.    Medications: Patient's Medications  New Prescriptions   No medications on file  Previous Medications   AMLODIPINE (NORVASC) 5 MG TABLET    TAKE 1 TABLET DAILY FOR BLOOD PRESSURE.   ASPIRIN 81 MG TABLET    Take 81 mg by mouth daily.     CHOLECALCIFEROL (VITAMIN D) 1000 UNITS TABLET    Take 1,000 Units by mouth daily. Take one tablet a daily   CRESTOR 10 MG TABLET    TAKE 1 TABLET DAILY TO LOWER CHOLESTEROL.   DOCUSATE SODIUM (COLACE) 100 MG CAPSULE    Take 100 mg by mouth. Take one tablet 3 times daily to prevent constipation   HYDROCODONE-ACETAMINOPHEN (NORCO/VICODIN) 5-325 MG PER TABLET    1 tablet. Three times a day as needed for pain   MAGNESIUM GLUCONATE 500 (27 MG) MG TABS    Take 1 tablet by mouth. Take one tablet once daily   MULTIPLE VITAMINS-MINERALS (PRESERVISION AREDS PO)    Take by mouth. Take one tablet  twice daily for vision   MULTIVITAMIN (THERAGRAN) PER TABLET    Take 1 tablet by mouth daily.     OMEGA-3 FATTY ACIDS (FISH OIL MAXIMUM STRENGTH) 1200 MG CAPS    Take 1 each by mouth. Take one daily   VITAMIN E 400 UNIT CAPSULE    Take 400 Units by mouth daily.  Modified Medications   No medications on file  Discontinued Medications   LEVOFLOXACIN (LEVAQUIN) 500 MG TABLET    Take 1 tablet (500 mg total) by mouth daily.   PHENAZOPYRIDINE (PYRIDIUM) 97 MG TABLET    Take 1 tablet (97 mg total) by mouth 3 (three) times daily as needed for pain.     Review of Systems  Constitutional: Positive for unexpected weight change (continues to lose weight, down 2# since 9/15). Negative for activity change and appetite change.  HENT: Positive for voice change (chronic dysphonia).        History of goiter.  Eyes: Negative.   Respiratory: Positive for cough. Negative for shortness of breath.        Produces clear mucus with cough. Chronic hoarseness.  Cardiovascular: Negative for chest pain, palpitations and leg swelling.  Gastrointestinal:       Mild dysphagia  Endocrine: Negative.   Genitourinary: Negative.   Musculoskeletal: Positive for back  pain.       Pains radiate from low back down the right leg.   Skin: Negative.   Neurological:       Remote hx CVA.  Psychiatric/Behavioral: Negative.     Filed Vitals:   07/21/14 1419  BP: 120/62  Pulse: 80  Temp: 98.2 F (36.8 C)  TempSrc: Oral  Weight: 108 lb (48.988 kg)   Body mass index is 19.75 kg/(m^2).  Physical Exam  Constitutional: She is oriented to person, place, and time. She appears well-developed. No distress.  Thin  HENT:  Head: Normocephalic.  Loss of hearing  Eyes: Conjunctivae and EOM are normal. Pupils are equal, round, and reactive to light.  Neck: Neck supple. No JVD present. No tracheal deviation present. Thyromegaly present.  Cardiovascular: Normal rate, regular rhythm, normal heart sounds and intact distal pulses.   Exam reveals no gallop and no friction rub.   No murmur heard. Bilateral mild varicose veins.  Pulmonary/Chest: Breath sounds normal. No respiratory distress. She has no wheezes. She has no rales.  Chronic hoarseness.  Abdominal: Bowel sounds are normal. She exhibits no distension and no mass. There is no tenderness.  Musculoskeletal: Normal range of motion. She exhibits no edema or tenderness.  Bony sacrum and coccyx prominent  Lymphadenopathy:    She has no cervical adenopathy.  Neurological: She is alert and oriented to person, place, and time. She has normal reflexes. No cranial nerve deficit. She exhibits normal muscle tone. Coordination normal.  Skin: No rash noted. No erythema. No pallor.  Psychiatric: She has a normal mood and affect. Her behavior is normal. Judgment and thought content normal.     Labs reviewed: No visits with results within 3 Month(s) from this visit. Latest known visit with results is:  Office Visit on 03/15/2014  Component Date Value Ref Range Status  . Color, UA 03/15/2014 yellow   Final  . Clarity, UA 03/15/2014 Clear   Final  . Glucose, UA 03/15/2014 Negative   Final  . Bilirubin, UA 03/15/2014 Negative   Final  . Ketones, UA 03/15/2014 Negative   Final  . Spec Grav, UA 03/15/2014 1.010   Final  . Blood, UA 03/15/2014 Trace   Final  . pH, UA 03/15/2014 7.5   Final  . Protein, UA 03/15/2014 Trace   Final  . Urobilinogen, UA 03/15/2014 negative   Final  . Nitrite, UA 03/15/2014 Negative   Final  . Leukocytes, UA 03/15/2014 Negative   Final  . Urine Culture, Routine 03/15/2014 Final report   Final  . Result 1 03/15/2014 No growth   Final     Assessment/Plan  1. Lower abdominal pain CT abdomen and pelvis  2. Essential hypertension Well controlled.  3. Midline low back pain without sciatica Wedge cushion with sacral cutout for seat discussed. Patient to order one  4. Nontoxic uninodular goiter Stable

## 2014-07-24 ENCOUNTER — Encounter (HOSPITAL_COMMUNITY): Payer: Self-pay

## 2014-07-24 ENCOUNTER — Emergency Department (HOSPITAL_COMMUNITY): Payer: Medicare Other

## 2014-07-24 ENCOUNTER — Inpatient Hospital Stay (HOSPITAL_COMMUNITY)
Admission: EM | Admit: 2014-07-24 | Discharge: 2014-07-28 | DRG: 186 | Disposition: A | Discharge status: Home / Self Care | Payer: Medicare Other | Attending: Internal Medicine | Admitting: Internal Medicine

## 2014-07-24 DIAGNOSIS — M4802 Spinal stenosis, cervical region: Secondary | ICD-10-CM | POA: Diagnosis present

## 2014-07-24 DIAGNOSIS — R509 Fever, unspecified: Secondary | ICD-10-CM | POA: Diagnosis not present

## 2014-07-24 DIAGNOSIS — I10 Essential (primary) hypertension: Secondary | ICD-10-CM | POA: Diagnosis not present

## 2014-07-24 DIAGNOSIS — J984 Other disorders of lung: Secondary | ICD-10-CM | POA: Diagnosis present

## 2014-07-24 DIAGNOSIS — J189 Pneumonia, unspecified organism: Secondary | ICD-10-CM | POA: Diagnosis present

## 2014-07-24 DIAGNOSIS — D649 Anemia, unspecified: Secondary | ICD-10-CM | POA: Diagnosis present

## 2014-07-24 DIAGNOSIS — J90 Pleural effusion, not elsewhere classified: Principal | ICD-10-CM | POA: Diagnosis present

## 2014-07-24 DIAGNOSIS — Z961 Presence of intraocular lens: Secondary | ICD-10-CM | POA: Diagnosis present

## 2014-07-24 DIAGNOSIS — Z9889 Other specified postprocedural states: Secondary | ICD-10-CM

## 2014-07-24 DIAGNOSIS — R079 Chest pain, unspecified: Secondary | ICD-10-CM

## 2014-07-24 DIAGNOSIS — R739 Hyperglycemia, unspecified: Secondary | ICD-10-CM | POA: Diagnosis present

## 2014-07-24 DIAGNOSIS — J989 Respiratory disorder, unspecified: Secondary | ICD-10-CM | POA: Diagnosis not present

## 2014-07-24 DIAGNOSIS — R109 Unspecified abdominal pain: Secondary | ICD-10-CM

## 2014-07-24 DIAGNOSIS — M069 Rheumatoid arthritis, unspecified: Secondary | ICD-10-CM | POA: Diagnosis present

## 2014-07-24 DIAGNOSIS — E785 Hyperlipidemia, unspecified: Secondary | ICD-10-CM | POA: Diagnosis not present

## 2014-07-24 DIAGNOSIS — M1990 Unspecified osteoarthritis, unspecified site: Secondary | ICD-10-CM | POA: Diagnosis not present

## 2014-07-24 DIAGNOSIS — Z8673 Personal history of transient ischemic attack (TIA), and cerebral infarction without residual deficits: Secondary | ICD-10-CM | POA: Diagnosis not present

## 2014-07-24 DIAGNOSIS — I272 Other secondary pulmonary hypertension: Secondary | ICD-10-CM | POA: Diagnosis present

## 2014-07-24 DIAGNOSIS — K828 Other specified diseases of gallbladder: Secondary | ICD-10-CM | POA: Diagnosis not present

## 2014-07-24 DIAGNOSIS — K838 Other specified diseases of biliary tract: Secondary | ICD-10-CM | POA: Diagnosis not present

## 2014-07-24 DIAGNOSIS — Z888 Allergy status to other drugs, medicaments and biological substances status: Secondary | ICD-10-CM | POA: Diagnosis not present

## 2014-07-24 DIAGNOSIS — M810 Age-related osteoporosis without current pathological fracture: Secondary | ICD-10-CM | POA: Diagnosis not present

## 2014-07-24 DIAGNOSIS — J948 Other specified pleural conditions: Secondary | ICD-10-CM | POA: Diagnosis not present

## 2014-07-24 DIAGNOSIS — H2512 Age-related nuclear cataract, left eye: Secondary | ICD-10-CM | POA: Diagnosis not present

## 2014-07-24 DIAGNOSIS — Z9841 Cataract extraction status, right eye: Secondary | ICD-10-CM

## 2014-07-24 DIAGNOSIS — E041 Nontoxic single thyroid nodule: Secondary | ICD-10-CM | POA: Diagnosis present

## 2014-07-24 DIAGNOSIS — R091 Pleurisy: Secondary | ICD-10-CM | POA: Diagnosis not present

## 2014-07-24 HISTORY — DX: Pulmonary hypertension, unspecified: I27.20

## 2014-07-24 HISTORY — DX: Rheumatic tricuspid insufficiency: I07.1

## 2014-07-24 LAB — URINALYSIS, ROUTINE W REFLEX MICROSCOPIC
Bilirubin Urine: NEGATIVE
Glucose, UA: NEGATIVE mg/dL
Hgb urine dipstick: NEGATIVE
Ketones, ur: 15 mg/dL — AB
Leukocytes, UA: NEGATIVE
Nitrite: NEGATIVE
Protein, ur: NEGATIVE mg/dL
Specific Gravity, Urine: 1.011 (ref 1.005–1.030)
Urobilinogen, UA: 0.2 mg/dL (ref 0.0–1.0)
pH: 7.5 (ref 5.0–8.0)

## 2014-07-24 LAB — BASIC METABOLIC PANEL
Anion gap: 11 (ref 5–15)
BUN: 20 mg/dL (ref 6–20)
CO2: 26 mmol/L (ref 22–32)
Calcium: 9.4 mg/dL (ref 8.9–10.3)
Chloride: 98 mmol/L — ABNORMAL LOW (ref 101–111)
Creatinine, Ser: 0.93 mg/dL (ref 0.44–1.00)
GFR calc Af Amer: >60 mL/min (ref >60)
GFR calc non Af Amer: 54 mL/min — ABNORMAL LOW (ref >60)
Glucose, Bld: 124 mg/dL — ABNORMAL HIGH (ref 65–99)
Potassium: 3.9 mmol/L (ref 3.5–5.1)
Sodium: 135 mmol/L (ref 135–145)

## 2014-07-24 LAB — CBC WITH DIFFERENTIAL/PLATELET
Basophils Absolute: 0 10*3/uL (ref 0.0–0.1)
Basophils Relative: 0 % (ref 0–1)
Eosinophils Absolute: 0.2 10*3/uL (ref 0.0–0.7)
Eosinophils Relative: 1 % (ref 0–5)
HCT: 38.5 % (ref 36.0–46.0)
Hemoglobin: 12.8 g/dL (ref 12.0–15.0)
Lymphocytes Relative: 17 % (ref 12–46)
Lymphs Abs: 1.8 10*3/uL (ref 0.7–4.0)
MCH: 28.7 pg (ref 26.0–34.0)
MCHC: 33.2 g/dL (ref 30.0–36.0)
MCV: 86.3 fL (ref 78.0–100.0)
Monocytes Absolute: 0.9 10*3/uL (ref 0.1–1.0)
Monocytes Relative: 8 % (ref 3–12)
Neutro Abs: 7.7 10*3/uL (ref 1.7–7.7)
Neutrophils Relative %: 74 % (ref 43–77)
Platelets: 281 10*3/uL (ref 150–400)
RBC: 4.46 MIL/uL (ref 3.87–5.11)
RDW: 14.7 % (ref 11.5–15.5)
WBC: 10.6 10*3/uL — ABNORMAL HIGH (ref 4.0–10.5)

## 2014-07-24 MED ORDER — DEXTROSE 5 % IV SOLN
500.0000 mg | Freq: Once | INTRAVENOUS | Status: AC
  Administered 2014-07-25: 500 mg via INTRAVENOUS
  Filled 2014-07-24: qty 500

## 2014-07-24 MED ORDER — ACETAMINOPHEN 325 MG PO TABS
650.0000 mg | ORAL_TABLET | Freq: Once | ORAL | Status: AC
  Administered 2014-07-24: 650 mg via ORAL
  Filled 2014-07-24: qty 2

## 2014-07-24 MED ORDER — SODIUM CHLORIDE 0.9 % IV SOLN
INTRAVENOUS | Status: DC
  Administered 2014-07-24: 22:00:00 via INTRAVENOUS

## 2014-07-24 MED ORDER — DEXTROSE 5 % IV SOLN
1.0000 g | Freq: Once | INTRAVENOUS | Status: AC
  Administered 2014-07-24: 1 g via INTRAVENOUS
  Filled 2014-07-24: qty 10

## 2014-07-24 NOTE — ED Notes (Signed)
Bed: QM57 Expected date: 07/24/14 Expected time: 9:06 PM Means of arrival: Ambulance Comments: Short of breath

## 2014-07-24 NOTE — ED Notes (Signed)
Pt complains of fever and general fatigue since this afternoon

## 2014-07-24 NOTE — ED Notes (Signed)
Patient transported to X-ray 

## 2014-07-24 NOTE — ED Provider Notes (Signed)
CSN: [id number]     Arrival date & time 07/24/14  2120 History   First MD Initiated Contact with Patient 07/24/14 2128     Chief Complaint  Patient presents with  . Fever     (Consider location/radiation/quality/duration/timing/severity/associated sxs/prior Treatment) Patient is a 81 y.o. female presenting with fever.  Fever Onset quality:  Sudden Timing:  Constant Progression:  Unchanged Chronicity:  New Associated symptoms: chills and sore throat   Associated symptoms: no confusion, no dysuria, no myalgias, no nausea and no vomiting      89yF with fever and generalized weakness. Onset this afternoon. Persistent since. Chills. Ocasional nonproductive cough. Mild sore throat. No pain complaints otherwise. No n/v. No urinary complaints.   Past Medical History  Diagnosis Date  . Vitamin D deficiency 04/25/2011  . Acute upper respiratory infections of unspecified site 2013  . Loss of weight 04/25/2011  . Other drug allergy(995.27) 04/04/2011  . Cellulitis and abscess of hand, except fingers and thumb 10/04/2010  . Urinary tract infection, site not specified   . Pain in limb 2012  . Urticaria, unspecified 11/01/2008  . Myalgia and myositis, unspecified 01/07/2007  . Supraventricular premature beats 10/2006  . Hyperlipidemia 2007  . Hypertension 2007  . 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 Spinal stenosis, unspecified region other than cervical 2002  . Osteoarthrosis, unspecified whether generalized or localized, unspecified site 07/22/2000  . Nontoxic uninodular goiter 08/15/1998  . Diaphragmatic hernia without mention of obstruction or gangrene 08/15/1998  . Diverticulosis of colon (without mention of hemorrhage) 2000  . Degeneration of intervertebral disc, site unspecified 2000  . Osteoporosis 2000  . Personal history of fall 2012  . Sprain of ankle, left   . Impacted cerumen   . Urine frequency 01/20/2014   Past Surgical History  Procedure Laterality  Date  . Laryngoscopy  1987    and biopsy  Dr. Haroldine Laws  . Back surgery  1990    HNP L3-4 Dr. Lynnette Caffey  . Facial cosmetic surgery  1995    Dr. Charolotte Eke  . Colonoscopy  1992    acute segmental colitis Dr. Russella Dar  . Cataract extraction w/ intraocular lens implant Right 05/19/2014    Dr. Dione Booze   Family History  Problem Relation Age of Onset  . Stroke Mother   . Stroke Father   . Hypertension Brother   . Rheum arthritis Maternal Grandmother    History  Substance Use Topics  . Smoking status: Never Smoker   . Smokeless tobacco: Never Used  . Alcohol Use: No   OB History    No data available     Review of Systems  Constitutional: Positive for fever and chills.  HENT: Positive for sore throat.   Gastrointestinal: Negative for nausea and vomiting.  Genitourinary: Negative for dysuria.  Musculoskeletal: Negative for myalgias.  Psychiatric/Behavioral: Negative for confusion.    All systems reviewed and negative, other than as noted in HPI.   Allergies  Doxycycline and Lipitor  Home Medications   Prior to Admission medications   Medication Sig Start Date End Date Taking? Authorizing Provider  amLODipine (NORVASC) 5 MG tablet TAKE 1 TABLET DAILY FOR BLOOD PRESSURE. 04/04/14  Yes Kimber Relic, MD  aspirin 81 MG tablet Take 81 mg by mouth daily.     Yes Historical Provider, MD  cholecalciferol (VITAMIN D) 1000 UNITS tablet Take 1,000 Units by mouth daily. Take one tablet a daily   Yes Historical Provider, MD  CRESTOR 10 MG tablet  TAKE 1 TABLET DAILY TO LOWER CHOLESTEROL. 03/07/14  Yes Kimber Relic, MD  docusate sodium (COLACE) 100 MG capsule Take 100 mg by mouth. Take one tablet 3 times daily to prevent constipation   Yes Historical Provider, MD  HYDROcodone-acetaminophen (NORCO/VICODIN) 5-325 MG per tablet Take 1 tablet by mouth 3 (three) times daily. Three times a day as needed for pain 06/17/12  Yes Historical Provider, MD  Magnesium Gluconate 500 (27 MG) MG TABS Take 1  tablet by mouth. Take one tablet once daily   Yes Historical Provider, MD  Multiple Vitamins-Minerals (PRESERVISION AREDS PO) Take 1 tablet by mouth 2 (two) times daily. Take one tablet twice daily for vision   Yes Historical Provider, MD  multivitamin [facility]) per tablet Take 1 tablet by mouth daily.     Yes Historical Provider, MD  Omega-3 Fatty Acids (FISH OIL MAXIMUM STRENGTH) 1200 MG CAPS Take 1 each by mouth. Take one daily   Yes Historical Provider, MD  vitamin E 400 UNIT capsule Take 400 Units by mouth daily.   Yes Historical Provider, MD  ketorolac (ACULAR) 0.4 % SOLN Place 1 drop into the left eye 3 (three) times daily.  06/17/14   Historical Provider, MD  ofloxacin (OCUFLOX) 0.3 % ophthalmic solution Place 1 drop into the left eye 3 (three) times daily.  06/17/14   Historical Provider, MD  prednisoLONE acetate (PRED FORTE) 1 % ophthalmic suspension Place 1 drop into the left eye 3 (three) times daily.  06/17/14   Historical Provider, MD   BP 129/60 mmHg  Pulse 91  Temp(Src) 98.7 F (37.1 C) (Oral)  Resp 18  SpO2 94% Physical Exam  Constitutional: She appears well-developed and well-nourished. No distress.  Laying in bed. NAD. Appears well.   HENT:  Head: Normocephalic and atraumatic.  Mouth/Throat: Oropharynx is clear and moist.  Pharynx normal in appearance  Eyes: Conjunctivae are normal. Right eye exhibits no discharge. Left eye exhibits no discharge.  Neck: Neck supple.  Cardiovascular: Normal rate, regular rhythm and normal heart sounds.  Exam reveals no gallop and no friction rub.   No murmur heard. Pulmonary/Chest: Effort normal and breath sounds normal. No respiratory distress.  Abdominal: Soft. She exhibits no distension. There is no tenderness.  Musculoskeletal: She exhibits no edema or tenderness.  Neurological: She is alert.  Skin: Skin is warm and dry.  Psychiatric: She has a normal mood and affect. Her behavior is normal. Thought content normal.  Nursing note and  vitals reviewed.   ED Course  Procedures (including critical care time) Labs Review Labs Reviewed  BASIC METABOLIC PANEL - Abnormal; Notable for the following:    Chloride 98 (*)    Glucose, Bld 124 (*)    GFR calc non Af Amer 54 (*)    All other components within normal limits  CBC WITH DIFFERENTIAL/PLATELET - Abnormal; Notable for the following:    WBC 10.6 (*)    All other components within normal limits  URINALYSIS, ROUTINE W REFLEX MICROSCOPIC - Abnormal; Notable for the following:    Ketones, ur 15 (*)    All other components within normal limits    Imaging Review Dg Chest 2 View  07/24/2014   CLINICAL DATA:  Acute onset of sore throat, fever and generalized fatigue. Initial encounter.  EXAM: CHEST  2 VIEW  COMPARISON:  Chest radiograph performed 10/05/2006  FINDINGS: The lungs are well-aerated. A small to moderate right-sided pleural effusion is noted, with associated atelectasis. Mild right midlung opacity is  also seen. This may reflect pneumonia. The left lung appears clear. No pneumothorax is seen.  The heart is normal in size; the mediastinal contour is within normal limits. No acute osseous abnormalities are seen.  IMPRESSION: Small to moderate right-sided pleural effusion, with associated atelectasis. Mild right mid lung opacity also seen. This may reflect pneumonia, given the patient's symptoms. Followup PA and lateral chest X-ray is recommended in 3-4 weeks following trial of antibiotic therapy to ensure resolution and exclude underlying malignancy.   Electronically Signed   By: Roanna Raider M.D.   On: 07/24/2014 22:24     EKG Interpretation None      MDM   Final diagnoses:  Fever  CAP (community acquired pneumonia)    89yF with CAP. Looks well. Not hypoxic. Curb-65 score is two making either inpatient or outpatient treatment with close follow-up reasonable. She lives in independent living. May be more prudent to admit given her advanced age although she  currently appears well.     Raeford Razor, MD 07/24/14 2330

## 2014-07-25 ENCOUNTER — Inpatient Hospital Stay (HOSPITAL_COMMUNITY): Payer: Medicare Other

## 2014-07-25 ENCOUNTER — Encounter (HOSPITAL_COMMUNITY): Payer: Self-pay | Admitting: Internal Medicine

## 2014-07-25 ENCOUNTER — Other Ambulatory Visit (HOSPITAL_COMMUNITY): Payer: Self-pay

## 2014-07-25 DIAGNOSIS — J189 Pneumonia, unspecified organism: Secondary | ICD-10-CM | POA: Diagnosis present

## 2014-07-25 DIAGNOSIS — R509 Fever, unspecified: Secondary | ICD-10-CM

## 2014-07-25 DIAGNOSIS — R079 Chest pain, unspecified: Secondary | ICD-10-CM

## 2014-07-25 DIAGNOSIS — J90 Pleural effusion, not elsewhere classified: Secondary | ICD-10-CM | POA: Diagnosis present

## 2014-07-25 DIAGNOSIS — I071 Rheumatic tricuspid insufficiency: Secondary | ICD-10-CM

## 2014-07-25 DIAGNOSIS — I272 Pulmonary hypertension, unspecified: Secondary | ICD-10-CM

## 2014-07-25 DIAGNOSIS — R739 Hyperglycemia, unspecified: Secondary | ICD-10-CM | POA: Diagnosis present

## 2014-07-25 HISTORY — DX: Rheumatic tricuspid insufficiency: I07.1

## 2014-07-25 HISTORY — DX: Pulmonary hypertension, unspecified: I27.20

## 2014-07-25 LAB — CBC
HCT: 33.4 % — ABNORMAL LOW (ref 36.0–46.0)
HEMOGLOBIN: 11 g/dL — AB (ref 12.0–15.0)
MCH: 28.4 pg (ref 26.0–34.0)
MCHC: 32.9 g/dL (ref 30.0–36.0)
MCV: 86.3 fL (ref 78.0–100.0)
Platelets: 267 10*3/uL (ref 150–400)
RBC: 3.87 MIL/uL (ref 3.87–5.11)
RDW: 14.8 % (ref 11.5–15.5)
WBC: 9.1 10*3/uL (ref 4.0–10.5)

## 2014-07-25 LAB — COMPREHENSIVE METABOLIC PANEL
ALK PHOS: 60 U/L (ref 38–126)
ALT: 9 U/L — ABNORMAL LOW (ref 14–54)
AST: 19 U/L (ref 15–41)
Albumin: 3.1 g/dL — ABNORMAL LOW (ref 3.5–5.0)
Anion gap: 9 (ref 5–15)
BUN: 15 mg/dL (ref 6–20)
CALCIUM: 8.6 mg/dL — AB (ref 8.9–10.3)
CO2: 26 mmol/L (ref 22–32)
Chloride: 102 mmol/L (ref 101–111)
Creatinine, Ser: 0.78 mg/dL (ref 0.44–1.00)
Glucose, Bld: 101 mg/dL — ABNORMAL HIGH (ref 65–99)
Potassium: 3.9 mmol/L (ref 3.5–5.1)
Sodium: 137 mmol/L (ref 135–145)
TOTAL PROTEIN: 6.2 g/dL — AB (ref 6.5–8.1)
Total Bilirubin: 0.6 mg/dL (ref 0.3–1.2)

## 2014-07-25 LAB — TROPONIN I
Troponin I: <0.03 ng/mL (ref <0.031)
Troponin I: <0.03 ng/mL (ref <0.031)
Troponin I: <0.03 ng/mL (ref <0.031)

## 2014-07-25 LAB — LACTATE DEHYDROGENASE, PLEURAL OR PERITONEAL FLUID: LD FL: 104 U/L — AB (ref 3–23)

## 2014-07-25 LAB — LACTATE DEHYDROGENASE: LDH: 141 U/L (ref 98–192)

## 2014-07-25 LAB — PROTEIN, TOTAL: TOTAL PROTEIN: 6.4 g/dL — AB (ref 6.5–8.1)

## 2014-07-25 LAB — BODY FLUID CELL COUNT WITH DIFFERENTIAL
EOS FL: 43 %
Lymphs, Fluid: 16 %
Monocyte-Macrophage-Serous Fluid: 40 % — ABNORMAL LOW (ref 50–90)
Neutrophil Count, Fluid: 1 % (ref 0–25)
Total Nucleated Cell Count, Fluid: 953 cu mm (ref 0–1000)

## 2014-07-25 LAB — GLUCOSE, SEROUS FLUID: Glucose, Fluid: 96 mg/dL

## 2014-07-25 LAB — AMYLASE, PLEURAL FLUID: AMYLASE, PLEURAL FLUID: 20 U/L

## 2014-07-25 LAB — ALBUMIN: ALBUMIN: 3.2 g/dL — AB (ref 3.5–5.0)

## 2014-07-25 LAB — ALBUMIN, FLUID (OTHER): Albumin, Fluid: 2.6 g/dL

## 2014-07-25 MED ORDER — CARVEDILOL 3.125 MG PO TABS
3.1250 mg | ORAL_TABLET | Freq: Two times a day (BID) | ORAL | Status: DC
  Administered 2014-07-25 – 2014-07-28 (×7): 3.125 mg via ORAL
  Filled 2014-07-25 (×8): qty 1

## 2014-07-25 MED ORDER — DEXTROSE 5 % IV SOLN
500.0000 mg | INTRAVENOUS | Status: DC
  Administered 2014-07-25 – 2014-07-26 (×2): 500 mg via INTRAVENOUS
  Filled 2014-07-25 (×2): qty 500

## 2014-07-25 MED ORDER — HYDROCODONE-ACETAMINOPHEN 5-325 MG PO TABS
1.0000 | ORAL_TABLET | Freq: Four times a day (QID) | ORAL | Status: DC | PRN
  Administered 2014-07-25 – 2014-07-28 (×4): 1 via ORAL
  Filled 2014-07-25 (×4): qty 1

## 2014-07-25 MED ORDER — IOHEXOL 300 MG/ML  SOLN
50.0000 mL | Freq: Once | INTRAMUSCULAR | Status: AC | PRN
  Administered 2014-07-25: 50 mL via ORAL

## 2014-07-25 MED ORDER — SODIUM CHLORIDE 0.9 % IV SOLN
INTRAVENOUS | Status: DC
  Administered 2014-07-25 – 2014-07-27 (×4): via INTRAVENOUS

## 2014-07-25 MED ORDER — DEXTROSE 5 % IV SOLN
1.0000 g | INTRAVENOUS | Status: DC
  Administered 2014-07-25 – 2014-07-27 (×3): 1 g via INTRAVENOUS
  Filled 2014-07-25 (×4): qty 10

## 2014-07-25 MED ORDER — IOHEXOL 350 MG/ML SOLN
100.0000 mL | Freq: Once | INTRAVENOUS | Status: AC | PRN
  Administered 2014-07-25: 100 mL via INTRAVENOUS

## 2014-07-25 NOTE — Procedures (Signed)
Successful US guided right thoracentesis. Yielded 620 mls of clear yellow fluid. Pt tolerated procedure well. No immediate complications.  Specimen was sent for labs. CXR ordered.  Corrin Parker R PA-C 07/25/2014 2:41 PM

## 2014-07-25 NOTE — Progress Notes (Addendum)
[facility] PROGRESS NOTE  Assessment/Plan: Atypical Chest pain/pneumonitis/leukocytosis: - Possible New CAP. - Agree with IV antibiotics. - Cont to spike fevers, leukocytosis improved. - Troponins negative, No specific ECG changes. - Check 2d echo. Repeat CXR in 6 weeks. - I have personally reviewed CXR and ct chest showed effusion with interstitial infiltrate. - ct abd showed acute findings.  Hyperglycemia: - none, cont to monitor.  Pleural effusion, right - Consult IR for thoracocentesis. - Send for cytology, cell count, LDH, protein and PH.  Normocytic anemia: - at baseline.  Code Status: full Family Communication: none  Disposition Plan: inpatient   Consultants:  IR  Procedures:  Ct abd and pelvis  Ct chest  Antibiotics:  rocephin and Azithro 5.16.2016  HPI/Subjective: Cont to complains of chest discomfort  Objective: Filed Vitals:   07/25/14 0100 07/25/14 0120 07/25/14 0618 07/25/14 0800  BP: 131/61 120/52 122/64 114/52  Pulse: 84 91 87 88  Temp:  97.9 F (36.6 C) 97.8 F (36.6 C) 98.7 F (37.1 C)  TempSrc:  Oral Oral Oral  Resp: 15 16 16 18   Height:  5\' 2"  (1.575 m)    Weight:  47.6 kg (104 lb 15 oz)    SpO2: 94% 97% 95% 96%    Intake/Output Summary (Last 24 hours) at 07/25/14 1051 Last data filed at 07/25/14 0744  Gross per 24 hour  Intake 1556.67 ml  Output   1650 ml  Net -93.33 ml   Filed Weights   07/25/14 0120  Weight: 47.6 kg (104 lb 15 oz)    Exam:  General: Alert, awake, oriented x3, in no acute distress.  HEENT: No bruits, no goiter.  Heart: Regular rate and rhythm. Lungs: Good air movement, clear Abdomen: Soft, nontender, nondistended, positive bowel sounds.  Neuro: Grossly intact, nonfocal.   Data Reviewed: Basic Metabolic Panel:  Recent Labs Lab 07/24/14 2145 07/25/14 0714  NA 135 137  K 3.9 3.9  CL 98* 102  CO2 26 26  GLUCOSE 124* 101*  BUN 20 15  CREATININE 0.93 0.78  CALCIUM 9.4 8.6*    Liver Function Tests:  Recent Labs Lab 07/25/14 0714  AST 19  ALT 9*  ALKPHOS 60  BILITOT 0.6  PROT 6.2*  ALBUMIN 3.1*   No results for input(s): LIPASE, AMYLASE in the last 168 hours. No results for input(s): AMMONIA in the last 168 hours. CBC:  Recent Labs Lab 07/24/14 2145 07/25/14 0714  WBC 10.6* 9.1  NEUTROABS 7.7  --   HGB 12.8 11.0*  HCT 38.5 33.4*  MCV 86.3 86.3  PLT 281 267   Cardiac Enzymes:  Recent Labs Lab 07/25/14 0031 07/25/14 0714  TROPONINI <0.03 <0.03   BNP (last 3 results) No results for input(s): BNP in the last 8760 hours.  ProBNP (last 3 results) No results for input(s): PROBNP in the last 8760 hours.  CBG: No results for input(s): GLUCAP in the last 168 hours.  No results found for this or any previous visit (from the past 240 hour(s)).   Studies: Dg Chest 2 View  07/24/2014   CLINICAL DATA:  Acute onset of sore throat, fever and generalized fatigue. Initial encounter.  EXAM: CHEST  2 VIEW  COMPARISON:  Chest radiograph performed 10/05/2006  FINDINGS: The lungs are well-aerated. A small to moderate right-sided pleural effusion is noted, with associated atelectasis. Mild right midlung opacity is also seen. This may reflect pneumonia. The left lung appears clear. No pneumothorax is seen.  The heart is normal in  size; the mediastinal contour is within normal limits. No acute osseous abnormalities are seen.  IMPRESSION: Small to moderate right-sided pleural effusion, with associated atelectasis. Mild right mid lung opacity also seen. This may reflect pneumonia, given the patient's symptoms. Followup PA and lateral chest X-ray is recommended in 3-4 weeks following trial of antibiotic therapy to ensure resolution and exclude underlying malignancy.   Electronically Signed   By: Roanna Raider M.D.   On: 07/24/2014 22:24   Ct Angio Chest Pe W/cm &/or Wo Cm  07/25/2014   CLINICAL DATA:  81 year old female with chest pain and abdominal pain. Fever,  generalized weakness, chills and nonproductive cough.  EXAM: CT ANGIOGRAPHY CHEST  CT ABDOMEN AND PELVIS WITH CONTRAST  TECHNIQUE: Multidetector CT imaging of the chest was performed using the standard protocol during bolus administration of intravenous contrast. Multiplanar CT image reconstructions and MIPs were obtained to evaluate the vascular anatomy. Multidetector CT imaging of the abdomen and pelvis was performed using the standard protocol during bolus administration of intravenous contrast.  CONTRAST:  OMNIPAQUE IOHEXOL 350 MG/ML SOLN  COMPARISON:  Chest radiograph 1 day prior.  FINDINGS: CTA CHEST FINDINGS  There are no filling defects within the pulmonary arteries to suggest pulmonary embolus.  Ectasia of the transverse and proximal descending thoracic aorta without dissection. Mild atherosclerotic calcifications. Moderate multi chamber cardiomegaly.  There is a moderate-to-large simple right pleural effusion with small amount of fluid tracking in the minor fissure. Adjacent compressive atelectasis involving the right lower and to a lesser extent middle lobes. There is no left pleural effusion. No pericardial effusion. Enlarged right thyroid gland as heterogeneous consistent with goiter.  Central bronchial thickening. Mild ground-glass opacity in the left lower lobe. Scattered calcifications within the atelectatic right lower lobe, may reflect granulomas.  There are no acute or suspicious osseous abnormalities.  CT ABDOMEN and PELVIS FINDINGS  The gallbladder is physiologically distended. There is dilatation of the common bile duct measuring 13 mm proximally and 11 mm distally. No calcified gallstones or choledocholithiasis. There is central intrahepatic biliary ductal dilatation. No focal hepatic lesion. The pancreas is atrophic with mild prominence of the pancreatic duct measuring 3 mm. No peripancreatic inflammatory change. No definite focal pancreatic abnormality. The spleen is normal. There is  no adrenal nodule.  Kidneys demonstrate symmetric enhancement and excretion common bilateral extrarenal pelvis configuration of both kidneys. Mild perinephric stranding noted about the right greater than left kidney. No intrarenal fluid collection.  The stomach is decompressed and not well evaluated. There are no dilated or thickened small bowel loops. No bowel obstruction, cecum is located in the central pelvis. There is oral contrast throughout the transverse colon. Moderate volume of stool in the distal colon without colonic wall thickening.  Abdominal aorta is normal in caliber, tortuous in its course with moderate atherosclerosis. No retroperitoneal adenopathy.  Within the pelvis the bladder is physiologically distended, there is a left lateral bladder diverticulum. No bladder wall thickening or perivesicular inflammatory change. The uterus is atrophic, normal for age. There is no adnexal mass. There is dilatation of the left ovarian vein. No pelvic adenopathy or free fluid.  Severe scoliotic curvature of the lumbar spine with associated degenerative change. Posttraumatic deformity at the pubic symphysis. There are no acute or suspicious osseous abnormalities.  Review of the MIP images confirms the above findings.  IMPRESSION: 1. No pulmonary embolus. 2. Moderate to large simple right pleural effusion with adjacent compressive atelectasis. Bronchial thickening, ground-glass opacities in the left lower lobe  are most consistent with pneumonitis, likely infectious or inflammatory. 3. Intra and extrahepatic biliary duct dilatation, with physiologic distension of gallbladder. The significance is uncertain, and correlation with LFTs is recommended. The pancreas is atrophic with mild prominence of the pancreatic duct, however discrete pancreatic mass is not seen. 4. Mild perinephric stranding about both kidneys, may be related to chronic renal disease versus urinary tract infection. 5. Chronic findings include ectatic  atherosclerotic thoracic and abdominal aorta, right thyroid goiter, and moderate-sized left bladder diverticulum.   Electronically Signed   By: Rubye Oaks M.D.   On: 07/25/2014 04:08   Ct Abdomen Pelvis W Contrast  07/25/2014   CLINICAL DATA:  81 year old female with chest pain and abdominal pain. Fever, generalized weakness, chills and nonproductive cough.  EXAM: CT ANGIOGRAPHY CHEST  CT ABDOMEN AND PELVIS WITH CONTRAST  TECHNIQUE: Multidetector CT imaging of the chest was performed using the standard protocol during bolus administration of intravenous contrast. Multiplanar CT image reconstructions and MIPs were obtained to evaluate the vascular anatomy. Multidetector CT imaging of the abdomen and pelvis was performed using the standard protocol during bolus administration of intravenous contrast.  CONTRAST:  OMNIPAQUE IOHEXOL 350 MG/ML SOLN  COMPARISON:  Chest radiograph 1 day prior.  FINDINGS: CTA CHEST FINDINGS  There are no filling defects within the pulmonary arteries to suggest pulmonary embolus.  Ectasia of the transverse and proximal descending thoracic aorta without dissection. Mild atherosclerotic calcifications. Moderate multi chamber cardiomegaly.  There is a moderate-to-large simple right pleural effusion with small amount of fluid tracking in the minor fissure. Adjacent compressive atelectasis involving the right lower and to a lesser extent middle lobes. There is no left pleural effusion. No pericardial effusion. Enlarged right thyroid gland as heterogeneous consistent with goiter.  Central bronchial thickening. Mild ground-glass opacity in the left lower lobe. Scattered calcifications within the atelectatic right lower lobe, may reflect granulomas.  There are no acute or suspicious osseous abnormalities.  CT ABDOMEN and PELVIS FINDINGS  The gallbladder is physiologically distended. There is dilatation of the common bile duct measuring 13 mm proximally and 11 mm distally. No calcified  gallstones or choledocholithiasis. There is central intrahepatic biliary ductal dilatation. No focal hepatic lesion. The pancreas is atrophic with mild prominence of the pancreatic duct measuring 3 mm. No peripancreatic inflammatory change. No definite focal pancreatic abnormality. The spleen is normal. There is no adrenal nodule.  Kidneys demonstrate symmetric enhancement and excretion common bilateral extrarenal pelvis configuration of both kidneys. Mild perinephric stranding noted about the right greater than left kidney. No intrarenal fluid collection.  The stomach is decompressed and not well evaluated. There are no dilated or thickened small bowel loops. No bowel obstruction, cecum is located in the central pelvis. There is oral contrast throughout the transverse colon. Moderate volume of stool in the distal colon without colonic wall thickening.  Abdominal aorta is normal in caliber, tortuous in its course with moderate atherosclerosis. No retroperitoneal adenopathy.  Within the pelvis the bladder is physiologically distended, there is a left lateral bladder diverticulum. No bladder wall thickening or perivesicular inflammatory change. The uterus is atrophic, normal for age. There is no adnexal mass. There is dilatation of the left ovarian vein. No pelvic adenopathy or free fluid.  Severe scoliotic curvature of the lumbar spine with associated degenerative change. Posttraumatic deformity at the pubic symphysis. There are no acute or suspicious osseous abnormalities.  Review of the MIP images confirms the above findings.  IMPRESSION: 1. No pulmonary embolus. 2. Moderate  to large simple right pleural effusion with adjacent compressive atelectasis. Bronchial thickening, ground-glass opacities in the left lower lobe are most consistent with pneumonitis, likely infectious or inflammatory. 3. Intra and extrahepatic biliary duct dilatation, with physiologic distension of gallbladder. The significance is uncertain,  and correlation with LFTs is recommended. The pancreas is atrophic with mild prominence of the pancreatic duct, however discrete pancreatic mass is not seen. 4. Mild perinephric stranding about both kidneys, may be related to chronic renal disease versus urinary tract infection. 5. Chronic findings include ectatic atherosclerotic thoracic and abdominal aorta, right thyroid goiter, and moderate-sized left bladder diverticulum.   Electronically Signed   By: Rubye Oaks M.D.   On: 07/25/2014 04:08    Scheduled Meds: . azithromycin  500 mg Intravenous Q24H  . carvedilol  3.125 mg Oral BID WC  . cefTRIAXone (ROCEPHIN)  IV  1 g Intravenous Q24H   Continuous Infusions: . sodium chloride 150 mL/hr at 07/24/14 2139  . sodium chloride 100 mL/hr at 07/25/14 0350    Time Spent: 35 min   Marinda Elk  [facility] Pager [phone]. If 7PM-7AM, please contact night-coverage at [email or web address], password [facility] 07/25/2014, 10:51 AM  LOS: 1 day

## 2014-07-25 NOTE — Progress Notes (Signed)
  Echocardiogram 2D Echocardiogram has been performed.  [name] FRANCES 07/25/2014, 12:58 PM

## 2014-07-25 NOTE — H&P (Signed)
[name] is an 81 y.o. female.    Jeanmarie Hubert (pcp)  Chief Complaint: chest pain HPI: 81 yo female with c/o chest pain sscp "pressure" with radiation to the neck,  Pt thought that she might be having a heart attack. + sob,  + diaphoresis.  Pain started about 3 pm.  Denies heartburn, n/v, palp, diarrhea, brbpr, black stool, dysuria, hematuria.  Pt has not yet had heart evaluation.  ED requested that pt be admitted for pneumonia.    Past Medical History  Diagnosis Date  . Vitamin D deficiency 04/25/2011  . Acute upper respiratory infections of unspecified site 2013  . Loss of weight 04/25/2011  . Other drug allergy(995.27) 04/04/2011  . Cellulitis and abscess of hand, except fingers and thumb 10/04/2010  . Urinary tract infection, site not specified   . Pain in limb 2012  . Urticaria, unspecified 11/01/2008  . Myalgia and myositis, unspecified 01/07/2007  . Supraventricular premature beats 10/2006  . Hyperlipidemia 2007  . Hypertension 2007  . 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 Spinal stenosis, unspecified region other than cervical 2002  . Osteoarthrosis, unspecified whether generalized or localized, unspecified site 07/22/2000  . Nontoxic uninodular goiter 08/15/1998  . Diaphragmatic hernia without mention of obstruction or gangrene 08/15/1998  . Diverticulosis of colon (without mention of hemorrhage) 2000  . Degeneration of intervertebral disc, site unspecified 2000  . Osteoporosis 2000  . Personal history of fall 2012  . Sprain of ankle, left   . Impacted cerumen   . Urine frequency 01/20/2014    Past Surgical History  Procedure Laterality Date  . Laryngoscopy  1987    and biopsy  Dr. Ernesto Rutherford  . Back surgery  1990    HNP L3-4 Dr. Cheri Rous  . Facial cosmetic surgery  1995    Dr. Jeanella Flattery  . Colonoscopy  1992    acute segmental colitis Dr. Fuller Plan  . Cataract extraction w/ intraocular lens implant Right 05/19/2014    Dr. Katy Fitch    Family  History  Problem Relation Age of Onset  . Stroke Mother   . Stroke Father   . Hypertension Brother   . Rheum arthritis Maternal Grandmother    Social History:  reports that she has never smoked. She has never used smokeless tobacco. She reports that she does not drink alcohol or use illicit drugs.  Allergies:  Allergies  Allergen Reactions  . Doxycycline Itching  . Lipitor [Atorvastatin] Other (See Comments)    PAIN IN LEGS     (Not in a hospital admission)  Results for orders placed or performed during the hospital encounter of 07/24/14 (from the past 48 hour(s))  Urinalysis, Routine w reflex microscopic     Status: Abnormal   Collection Time: 07/24/14  9:34 PM  Result Value Ref Range   Color, Urine YELLOW YELLOW   APPearance CLEAR CLEAR   Specific Gravity, Urine 1.011 1.005 - 1.030   pH 7.5 5.0 - 8.0   Glucose, UA NEGATIVE NEGATIVE mg/dL   Hgb urine dipstick NEGATIVE NEGATIVE   Bilirubin Urine NEGATIVE NEGATIVE   Ketones, ur 15 (A) NEGATIVE mg/dL   Protein, ur NEGATIVE NEGATIVE mg/dL   Urobilinogen, UA 0.2 0.0 - 1.0 mg/dL   Nitrite NEGATIVE NEGATIVE   Leukocytes, UA NEGATIVE NEGATIVE    Comment: MICROSCOPIC NOT DONE ON URINES WITH NEGATIVE PROTEIN, BLOOD, LEUKOCYTES, NITRITE, OR GLUCOSE <1000 mg/dL.  Basic metabolic panel     Status: Abnormal   Collection Time: 07/24/14  9:45 PM  Result Value Ref Range   Sodium 135 135 - 145 mmol/L   Potassium 3.9 3.5 - 5.1 mmol/L   Chloride 98 (L) 101 - 111 mmol/L   CO2 26 22 - 32 mmol/L   Glucose, Bld 124 (H) 65 - 99 mg/dL   BUN 20 6 - 20 mg/dL   Creatinine, Ser 0.93 0.44 - 1.00 mg/dL   Calcium 9.4 8.9 - 10.3 mg/dL   GFR calc non Af Amer 54 (L) >60 mL/min   GFR calc Af Amer >60 >60 mL/min    Comment: (NOTE) The eGFR has been calculated using the CKD EPI equation. This calculation has not been validated in all clinical situations. eGFR's persistently <60 mL/min signify possible Chronic Kidney Disease.    Anion gap 11 5 - 15   CBC with Differential     Status: Abnormal   Collection Time: 07/24/14  9:45 PM  Result Value Ref Range   WBC 10.6 (H) 4.0 - 10.5 K/uL   RBC 4.46 3.87 - 5.11 MIL/uL   Hemoglobin 12.8 12.0 - 15.0 g/dL   HCT 38.5 36.0 - 46.0 %   MCV 86.3 78.0 - 100.0 fL   MCH 28.7 26.0 - 34.0 pg   MCHC 33.2 30.0 - 36.0 g/dL   RDW 14.7 11.5 - 15.5 %   Platelets 281 150 - 400 K/uL   Neutrophils Relative % 74 43 - 77 %   Neutro Abs 7.7 1.7 - 7.7 K/uL   Lymphocytes Relative 17 12 - 46 %   Lymphs Abs 1.8 0.7 - 4.0 K/uL   Monocytes Relative 8 3 - 12 %   Monocytes Absolute 0.9 0.1 - 1.0 K/uL   Eosinophils Relative 1 0 - 5 %   Eosinophils Absolute 0.2 0.0 - 0.7 K/uL   Basophils Relative 0 0 - 1 %   Basophils Absolute 0.0 0.0 - 0.1 K/uL   Dg Chest 2 View  07/24/2014   CLINICAL DATA:  Acute onset of sore throat, fever and generalized fatigue. Initial encounter.  EXAM: CHEST  2 VIEW  COMPARISON:  Chest radiograph performed 10/05/2006  FINDINGS: The lungs are well-aerated. A small to moderate right-sided pleural effusion is noted, with associated atelectasis. Mild right midlung opacity is also seen. This may reflect pneumonia. The left lung appears clear. No pneumothorax is seen.  The heart is normal in size; the mediastinal contour is within normal limits. No acute osseous abnormalities are seen.  IMPRESSION: Small to moderate right-sided pleural effusion, with associated atelectasis. Mild right mid lung opacity also seen. This may reflect pneumonia, given the patient's symptoms. Followup PA and lateral chest X-ray is recommended in 3-4 weeks following trial of antibiotic therapy to ensure resolution and exclude underlying malignancy.   Electronically Signed   By: Garald Balding M.D.   On: 07/24/2014 22:24    Review of Systems  Constitutional: Positive for fever. Negative for chills, weight loss, malaise/fatigue and diaphoresis.  HENT: Negative for congestion, ear discharge, ear pain, hearing loss, nosebleeds, sore  throat and tinnitus.   Eyes: Negative for blurred vision, double vision, photophobia, pain, discharge and redness.  Respiratory: Positive for shortness of breath. Negative for cough, hemoptysis, sputum production, wheezing and stridor.   Cardiovascular: Positive for chest pain. Negative for palpitations, orthopnea, claudication, leg swelling and PND.  Gastrointestinal: Positive for abdominal pain. Negative for heartburn, nausea, vomiting, diarrhea, constipation, blood in stool and melena.       Bilateral lower abd pain since 03/2014  Genitourinary:  Negative for dysuria, urgency, frequency, hematuria and flank pain.  Musculoskeletal: Negative for myalgias, back pain, joint pain, falls and neck pain.  Skin: Negative for itching and rash.  Neurological: Negative.  Negative for weakness and headaches.  Endo/Heme/Allergies: Negative for environmental allergies and polydipsia. Does not bruise/bleed easily.  Psychiatric/Behavioral: Negative.     Blood pressure 129/60, pulse 91, temperature 98.7 F (37.1 C), temperature source Oral, resp. rate 18, SpO2 94 %. Physical Exam  Constitutional: She is oriented to person, place, and time. She appears well-developed and well-nourished.  HENT:  Head: Normocephalic and atraumatic.  Mouth/Throat: No oropharyngeal exudate.  Eyes: Conjunctivae and EOM are normal. Pupils are equal, round, and reactive to light. No scleral icterus.  Neck: Normal range of motion. Neck supple. No JVD present. No tracheal deviation present. No thyromegaly present.  Cardiovascular: Normal rate and regular rhythm.  Exam reveals no gallop and no friction rub.   No murmur heard. Respiratory: Effort normal and breath sounds normal. No respiratory distress. She has no wheezes. She has no rales.  GI: Soft. Bowel sounds are normal. She exhibits no distension. There is no tenderness. There is no rebound and no guarding.  Musculoskeletal: Normal range of motion. She exhibits no edema or  tenderness.  Lymphadenopathy:    She has no cervical adenopathy.  Neurological: She is alert and oriented to person, place, and time. She has normal reflexes. She displays normal reflexes. No cranial nerve deficit. She exhibits normal muscle tone. Coordination normal.  Skin: Skin is warm and dry. No rash noted. No erythema. No pallor.  Psychiatric: She has a normal mood and affect. Her behavior is normal. Judgment and thought content normal.     Assessment/Plan Chest pain Tele Trop i q6h x3 ekg Cont Aspirin, cont crestor,  Start on carvedilol 3.195m po bid  Fever ? Due to pneumonia Blood cx x2, no sputum cx, since not producing sputum Check LFT Check CT scan chest  ABdominal pain, bilateral lower  Check CT scan abd, pelvis  Hyperglycemia Check hga1c  DVT prophylaxis: scd, lovenox Alanee Ting 07/25/2014, 12:09 AM

## 2014-07-25 NOTE — ED Notes (Signed)
4th floor made aware that there will be a delay in transporting pt upstairs because of CT's ordered in ED.

## 2014-07-25 NOTE — ED Notes (Signed)
Admitting physician at bedside

## 2014-07-26 LAB — PH, BODY FLUID: pH, Fluid: 8

## 2014-07-26 LAB — HEMOGLOBIN A1C
Hgb A1c MFr Bld: 5.7 % — ABNORMAL HIGH (ref 4.8–5.6)
MEAN PLASMA GLUCOSE: 117 mg/dL

## 2014-07-26 NOTE — Progress Notes (Signed)
[facility] PROGRESS NOTE  Assessment/Plan: CAP: - Possible New CAP. - Agree with IV antibiotics. - Afebrile, Check 2d as below, no HF. Repeat CXR in 6 weeks. - ct abd showed no acute findings.  Hyperglycemia: - none, cont to monitor.  Pleural effusion, right - Consulted IR for thoracocentesis yield 660 cc of yellow fluid - Send for cytology, cell count, LDH, protein and PH which are pending.  Normocytic anemia: - at baseline.  Code Status: full Family Communication: none  Disposition Plan: inpatient   Consultants:  IR  Procedures:  Ct abd and pelvis  Ct chest  Echo : estimated ejection fraction was in the range of 55% to 60%. Wall motion was normal; there were no regional wall motion abnormalities   Antibiotics:  rocephin and Azithro 5.16.2016  HPI/Subjective: Feels much better.  Objective: Filed Vitals:   07/25/14 1513 07/25/14 2335 07/26/14 0643 07/26/14 0733  BP: 139/57 121/49 136/60 135/62  Pulse: 84 83 91 88  Temp: 98.2 F (36.8 C) 99.4 F (37.4 C) 99.6 F (37.6 C)   TempSrc: Oral Oral Oral   Resp: 16 16 16    Height:      Weight:      SpO2: 99% 97% 96%     Intake/Output Summary (Last 24 hours) at 07/26/14 1035 Last data filed at 07/26/14 0829  Gross per 24 hour  Intake   3200 ml  Output   3401 ml  Net   -201 ml   Filed Weights   07/25/14 0120  Weight: 47.6 kg (104 lb 15 oz)    Exam:  General: Alert, awake, oriented x3, in no acute distress.  HEENT: No bruits, no goiter.  Heart: Regular rate and rhythm. Lungs: Good air movement, clear Abdomen: Soft, nontender, nondistended, positive bowel sounds.  Neuro: Grossly intact, nonfocal.   Data Reviewed: Basic Metabolic Panel:  Recent Labs Lab 07/24/14 2145 07/25/14 0714  NA 135 137  K 3.9 3.9  CL 98* 102  CO2 26 26  GLUCOSE 124* 101*  BUN 20 15  CREATININE 0.93 0.78  CALCIUM 9.4 8.6*   Liver Function Tests:  Recent Labs Lab 07/25/14 0714  AST 19  ALT  9*  ALKPHOS 60  BILITOT 0.6  PROT 6.4*  6.2*  ALBUMIN 3.2*  3.1*   No results for input(s): LIPASE, AMYLASE in the last 168 hours. No results for input(s): AMMONIA in the last 168 hours. CBC:  Recent Labs Lab 07/24/14 2145 07/25/14 0714  WBC 10.6* 9.1  NEUTROABS 7.7  --   HGB 12.8 11.0*  HCT 38.5 33.4*  MCV 86.3 86.3  PLT 281 267   Cardiac Enzymes:  Recent Labs Lab 07/25/14 0031 07/25/14 0714 07/25/14 1230  TROPONINI <0.03 <0.03 <0.03   BNP (last 3 results) No results for input(s): BNP in the last 8760 hours.  ProBNP (last 3 results) No results for input(s): PROBNP in the last 8760 hours.  CBG: No results for input(s): GLUCAP in the last 168 hours.  No results found for this or any previous visit (from the past 240 hour(s)).   Studies: Dg Chest 1 View  07/25/2014   CLINICAL DATA:  Status post right thoracentesis  EXAM: CHEST  1 VIEW  COMPARISON:  07/24/2014  FINDINGS: There is been interval right-sided thoracentesis with near complete resolution of right-sided pleural fluid. No pneumothorax is seen. The left lung remains clear. The cardiac shadow is stable.  IMPRESSION: No pneumothorax following right thoracentesis.   Electronically Signed   By:  Alcide Clever M.D.   On: 07/25/2014 14:55   Dg Chest 2 View  07/24/2014   CLINICAL DATA:  Acute onset of sore throat, fever and generalized fatigue. Initial encounter.  EXAM: CHEST  2 VIEW  COMPARISON:  Chest radiograph performed 10/05/2006  FINDINGS: The lungs are well-aerated. A small to moderate right-sided pleural effusion is noted, with associated atelectasis. Mild right midlung opacity is also seen. This may reflect pneumonia. The left lung appears clear. No pneumothorax is seen.  The heart is normal in size; the mediastinal contour is within normal limits. No acute osseous abnormalities are seen.  IMPRESSION: Small to moderate right-sided pleural effusion, with associated atelectasis. Mild right mid lung opacity also  seen. This may reflect pneumonia, given the patient's symptoms. Followup PA and lateral chest X-ray is recommended in 3-4 weeks following trial of antibiotic therapy to ensure resolution and exclude underlying malignancy.   Electronically Signed   By: Roanna Raider M.D.   On: 07/24/2014 22:24   Ct Angio Chest Pe W/cm &/or Wo Cm  07/25/2014   CLINICAL DATA:  81 year old female with chest pain and abdominal pain. Fever, generalized weakness, chills and nonproductive cough.  EXAM: CT ANGIOGRAPHY CHEST  CT ABDOMEN AND PELVIS WITH CONTRAST  TECHNIQUE: Multidetector CT imaging of the chest was performed using the standard protocol during bolus administration of intravenous contrast. Multiplanar CT image reconstructions and MIPs were obtained to evaluate the vascular anatomy. Multidetector CT imaging of the abdomen and pelvis was performed using the standard protocol during bolus administration of intravenous contrast.  CONTRAST:  OMNIPAQUE IOHEXOL 350 MG/ML SOLN  COMPARISON:  Chest radiograph 1 day prior.  FINDINGS: CTA CHEST FINDINGS  There are no filling defects within the pulmonary arteries to suggest pulmonary embolus.  Ectasia of the transverse and proximal descending thoracic aorta without dissection. Mild atherosclerotic calcifications. Moderate multi chamber cardiomegaly.  There is a moderate-to-large simple right pleural effusion with small amount of fluid tracking in the minor fissure. Adjacent compressive atelectasis involving the right lower and to a lesser extent middle lobes. There is no left pleural effusion. No pericardial effusion. Enlarged right thyroid gland as heterogeneous consistent with goiter.  Central bronchial thickening. Mild ground-glass opacity in the left lower lobe. Scattered calcifications within the atelectatic right lower lobe, may reflect granulomas.  There are no acute or suspicious osseous abnormalities.  CT ABDOMEN and PELVIS FINDINGS  The gallbladder is physiologically  distended. There is dilatation of the common bile duct measuring 13 mm proximally and 11 mm distally. No calcified gallstones or choledocholithiasis. There is central intrahepatic biliary ductal dilatation. No focal hepatic lesion. The pancreas is atrophic with mild prominence of the pancreatic duct measuring 3 mm. No peripancreatic inflammatory change. No definite focal pancreatic abnormality. The spleen is normal. There is no adrenal nodule.  Kidneys demonstrate symmetric enhancement and excretion common bilateral extrarenal pelvis configuration of both kidneys. Mild perinephric stranding noted about the right greater than left kidney. No intrarenal fluid collection.  The stomach is decompressed and not well evaluated. There are no dilated or thickened small bowel loops. No bowel obstruction, cecum is located in the central pelvis. There is oral contrast throughout the transverse colon. Moderate volume of stool in the distal colon without colonic wall thickening.  Abdominal aorta is normal in caliber, tortuous in its course with moderate atherosclerosis. No retroperitoneal adenopathy.  Within the pelvis the bladder is physiologically distended, there is a left lateral bladder diverticulum. No bladder wall thickening or perivesicular inflammatory  change. The uterus is atrophic, normal for age. There is no adnexal mass. There is dilatation of the left ovarian vein. No pelvic adenopathy or free fluid.  Severe scoliotic curvature of the lumbar spine with associated degenerative change. Posttraumatic deformity at the pubic symphysis. There are no acute or suspicious osseous abnormalities.  Review of the MIP images confirms the above findings.  IMPRESSION: 1. No pulmonary embolus. 2. Moderate to large simple right pleural effusion with adjacent compressive atelectasis. Bronchial thickening, ground-glass opacities in the left lower lobe are most consistent with pneumonitis, likely infectious or inflammatory. 3. Intra and  extrahepatic biliary duct dilatation, with physiologic distension of gallbladder. The significance is uncertain, and correlation with LFTs is recommended. The pancreas is atrophic with mild prominence of the pancreatic duct, however discrete pancreatic mass is not seen. 4. Mild perinephric stranding about both kidneys, may be related to chronic renal disease versus urinary tract infection. 5. Chronic findings include ectatic atherosclerotic thoracic and abdominal aorta, right thyroid goiter, and moderate-sized left bladder diverticulum.   Electronically Signed   By: Rubye Oaks M.D.   On: 07/25/2014 04:08   Ct Abdomen Pelvis W Contrast  07/25/2014   CLINICAL DATA:  81 year old female with chest pain and abdominal pain. Fever, generalized weakness, chills and nonproductive cough.  EXAM: CT ANGIOGRAPHY CHEST  CT ABDOMEN AND PELVIS WITH CONTRAST  TECHNIQUE: Multidetector CT imaging of the chest was performed using the standard protocol during bolus administration of intravenous contrast. Multiplanar CT image reconstructions and MIPs were obtained to evaluate the vascular anatomy. Multidetector CT imaging of the abdomen and pelvis was performed using the standard protocol during bolus administration of intravenous contrast.  CONTRAST:  OMNIPAQUE IOHEXOL 350 MG/ML SOLN  COMPARISON:  Chest radiograph 1 day prior.  FINDINGS: CTA CHEST FINDINGS  There are no filling defects within the pulmonary arteries to suggest pulmonary embolus.  Ectasia of the transverse and proximal descending thoracic aorta without dissection. Mild atherosclerotic calcifications. Moderate multi chamber cardiomegaly.  There is a moderate-to-large simple right pleural effusion with small amount of fluid tracking in the minor fissure. Adjacent compressive atelectasis involving the right lower and to a lesser extent middle lobes. There is no left pleural effusion. No pericardial effusion. Enlarged right thyroid gland as heterogeneous  consistent with goiter.  Central bronchial thickening. Mild ground-glass opacity in the left lower lobe. Scattered calcifications within the atelectatic right lower lobe, may reflect granulomas.  There are no acute or suspicious osseous abnormalities.  CT ABDOMEN and PELVIS FINDINGS  The gallbladder is physiologically distended. There is dilatation of the common bile duct measuring 13 mm proximally and 11 mm distally. No calcified gallstones or choledocholithiasis. There is central intrahepatic biliary ductal dilatation. No focal hepatic lesion. The pancreas is atrophic with mild prominence of the pancreatic duct measuring 3 mm. No peripancreatic inflammatory change. No definite focal pancreatic abnormality. The spleen is normal. There is no adrenal nodule.  Kidneys demonstrate symmetric enhancement and excretion common bilateral extrarenal pelvis configuration of both kidneys. Mild perinephric stranding noted about the right greater than left kidney. No intrarenal fluid collection.  The stomach is decompressed and not well evaluated. There are no dilated or thickened small bowel loops. No bowel obstruction, cecum is located in the central pelvis. There is oral contrast throughout the transverse colon. Moderate volume of stool in the distal colon without colonic wall thickening.  Abdominal aorta is normal in caliber, tortuous in its course with moderate atherosclerosis. No retroperitoneal adenopathy.  Within the pelvis  the bladder is physiologically distended, there is a left lateral bladder diverticulum. No bladder wall thickening or perivesicular inflammatory change. The uterus is atrophic, normal for age. There is no adnexal mass. There is dilatation of the left ovarian vein. No pelvic adenopathy or free fluid.  Severe scoliotic curvature of the lumbar spine with associated degenerative change. Posttraumatic deformity at the pubic symphysis. There are no acute or suspicious osseous abnormalities.  Review of the  MIP images confirms the above findings.  IMPRESSION: 1. No pulmonary embolus. 2. Moderate to large simple right pleural effusion with adjacent compressive atelectasis. Bronchial thickening, ground-glass opacities in the left lower lobe are most consistent with pneumonitis, likely infectious or inflammatory. 3. Intra and extrahepatic biliary duct dilatation, with physiologic distension of gallbladder. The significance is uncertain, and correlation with LFTs is recommended. The pancreas is atrophic with mild prominence of the pancreatic duct, however discrete pancreatic mass is not seen. 4. Mild perinephric stranding about both kidneys, may be related to chronic renal disease versus urinary tract infection. 5. Chronic findings include ectatic atherosclerotic thoracic and abdominal aorta, right thyroid goiter, and moderate-sized left bladder diverticulum.   Electronically Signed   By: Rubye Oaks M.D.   On: 07/25/2014 04:08   US Thoracentesis Asp Pleural Space W/img Guide  07/25/2014   CLINICAL DATA:  Pleural effusion  EXAM: ULTRASOUND GUIDED RIGHT THORACENTESIS  COMPARISON:  Previous exams.  PROCEDURE: Using sterile technique, 8 mls 1% lidocaine was used to anesthetize the area, under direct ultrasound visualization, A Yeuh catheter was used to aspirate 620 mls of clear yellow fluid from the right pleural space. Fluid was sent for labs.  Patient tolerated procedure well.  IMPRESSION: Ultrasound-guided Right Thoracentesis.  No apparent complications.  RECOMMENDATIONS: Will check Chest Xray.  Read by:  Corrin Parker  PA-C   Electronically Signed   By: Irish Lack M.D.   On: 07/25/2014 14:51    Scheduled Meds: . azithromycin  500 mg Intravenous Q24H  . carvedilol  3.125 mg Oral BID WC  . cefTRIAXone (ROCEPHIN)  IV  1 g Intravenous Q24H   Continuous Infusions: . sodium chloride 150 mL/hr at 07/24/14 2139  . sodium chloride 100 mL/hr at 07/26/14 0302    Time Spent: 35 min   Marinda Elk  [facility] Pager [phone]. If 7PM-7AM, please contact night-coverage at [email or web address], password [facility] 07/26/2014, 10:35 AM  LOS: 2 days

## 2014-07-27 ENCOUNTER — Other Ambulatory Visit: Payer: Medicare Other

## 2014-07-27 DIAGNOSIS — J948 Other specified pleural conditions: Secondary | ICD-10-CM

## 2014-07-27 DIAGNOSIS — J189 Pneumonia, unspecified organism: Secondary | ICD-10-CM

## 2014-07-27 DIAGNOSIS — R739 Hyperglycemia, unspecified: Secondary | ICD-10-CM

## 2014-07-27 MED ORDER — ASPIRIN EC 81 MG PO TBEC
81.0000 mg | DELAYED_RELEASE_TABLET | Freq: Every day | ORAL | Status: DC
  Administered 2014-07-27 – 2014-07-28 (×2): 81 mg via ORAL
  Filled 2014-07-27 (×2): qty 1

## 2014-07-27 MED ORDER — VITAMINS A & D EX OINT
TOPICAL_OINTMENT | CUTANEOUS | Status: AC
  Administered 2014-07-27: 21:00:00
  Filled 2014-07-27: qty 5

## 2014-07-27 MED ORDER — ADULT MULTIVITAMIN W/MINERALS CH
1.0000 | ORAL_TABLET | Freq: Every day | ORAL | Status: DC
  Administered 2014-07-27 – 2014-07-28 (×2): 1 via ORAL
  Filled 2014-07-27 (×2): qty 1

## 2014-07-27 MED ORDER — KETOROLAC TROMETHAMINE 0.5 % OP SOLN
1.0000 [drp] | Freq: Three times a day (TID) | OPHTHALMIC | Status: DC
  Administered 2014-07-27 – 2014-07-28 (×3): 1 [drp] via OPHTHALMIC
  Filled 2014-07-27: qty 3

## 2014-07-27 MED ORDER — OMEGA-3-ACID ETHYL ESTERS 1 G PO CAPS
1.0000 g | ORAL_CAPSULE | Freq: Two times a day (BID) | ORAL | Status: DC
  Administered 2014-07-27 – 2014-07-28 (×2): 1 g via ORAL
  Filled 2014-07-27 (×2): qty 1

## 2014-07-27 MED ORDER — ROSUVASTATIN CALCIUM 10 MG PO TABS
10.0000 mg | ORAL_TABLET | Freq: Every day | ORAL | Status: DC
  Administered 2014-07-27: 10 mg via ORAL
  Filled 2014-07-27: qty 1

## 2014-07-27 MED ORDER — OFLOXACIN 0.3 % OP SOLN
1.0000 [drp] | Freq: Three times a day (TID) | OPHTHALMIC | Status: DC
  Administered 2014-07-27 – 2014-07-28 (×3): 1 [drp] via OPHTHALMIC
  Filled 2014-07-27: qty 5

## 2014-07-27 MED ORDER — AZITHROMYCIN 500 MG PO TABS
500.0000 mg | ORAL_TABLET | Freq: Every day | ORAL | Status: DC
  Administered 2014-07-27: 500 mg via ORAL
  Filled 2014-07-27 (×2): qty 1

## 2014-07-27 MED ORDER — PREDNISOLONE ACETATE 1 % OP SUSP
1.0000 [drp] | Freq: Three times a day (TID) | OPHTHALMIC | Status: DC
  Administered 2014-07-27 – 2014-07-28 (×3): 1 [drp] via OPHTHALMIC
  Filled 2014-07-27: qty 1

## 2014-07-27 MED ORDER — DOCUSATE SODIUM 100 MG PO CAPS
100.0000 mg | ORAL_CAPSULE | Freq: Two times a day (BID) | ORAL | Status: DC
  Administered 2014-07-27 – 2014-07-28 (×2): 100 mg via ORAL
  Filled 2014-07-27 (×2): qty 1

## 2014-07-27 MED ORDER — AMLODIPINE BESYLATE 5 MG PO TABS
5.0000 mg | ORAL_TABLET | Freq: Every day | ORAL | Status: DC
  Administered 2014-07-27 – 2014-07-28 (×2): 5 mg via ORAL
  Filled 2014-07-27 (×2): qty 1

## 2014-07-27 MED ORDER — VITAMIN D 1000 UNITS PO TABS
1000.0000 [IU] | ORAL_TABLET | Freq: Every day | ORAL | Status: DC
  Administered 2014-07-27 – 2014-07-28 (×2): 1000 [IU] via ORAL
  Filled 2014-07-27 (×3): qty 1

## 2014-07-27 NOTE — Progress Notes (Signed)
PHARMACIST - PHYSICIAN COMMUNICATION DR:   Rama CONCERNING: Antibiotic IV to Oral Route Change Policy  RECOMMENDATION: This patient is receiving Zithromax by the intravenous route.  Based on criteria approved by the Pharmacy and Therapeutics Committee, the antibiotic(s) is/are being converted to the equivalent oral dose form(s).   DESCRIPTION: These criteria include:  Patient being treated for a respiratory tract infection, urinary tract infection, cellulitis or clostridium difficile associated diarrhea if on metronidazole  The patient is not neutropenic and does not exhibit a GI malabsorption state  The patient is eating (either orally or via tube) and/or has been taking other orally administered medications for a least 24 hours  The patient is improving clinically and has a Tmax < 100.5  If you have questions about this conversion, please contact the Pharmacy Department  []   [phone] )  ( [phone] []   [phone] )  [facility] []   [phone] )  [facility] []   [phone] )  [facility] [x]   [phone] )  [facility]    [phone], PharmD, [facility] Pager [phone] 07/27/2014 10:52 AM

## 2014-07-27 NOTE — Progress Notes (Addendum)
Progress Note   [name] ION:[id number] DOB: 1924/09/28 DOA: 07/24/2014 PCP: Kimber Relic, MD   Brief Narrative:   [name] is an 81 y.o. female with a PMH of hypertension, hyperlipidemia and cerebrovascular disease who was admitted 07/24/14 with community-acquired pneumonia.  Assessment/Plan:   Principal Problem:   CAP (community acquired pneumonia) / right pleural effusion / pneumonitis - Continue current antibiotics with azithromycin and Rocephin. - CT of the chest ordered on admission showed right sided pneumonitis and a moderate-large pleural effusion. - Status post thoracentesis. Follow-up pleural fluid cultures. - Repeat CXR in 6 weeks.  Active Problems:   Chest pain - Troponins negative 3. - Continue aspirin and Crestor. Carvedilol started.    Hyperglycemia - Hemoglobin A1c 5.7%, corresponding to a mean plasma glucose of 117 consistent with prediabetes.    DVT Prophylaxis - SCDs.  Code Status: Full. Family Communication: Family updated at the bedside. Disposition Plan: From [facility].  Probable D/C back there 07/28/14, pending PT evaluation.   IV Access:    Peripheral IV   Procedures and diagnostic studies:   Dg Chest 1 View  07/25/2014   CLINICAL DATA:  Status post right thoracentesis  EXAM: CHEST  1 VIEW  COMPARISON:  07/24/2014  FINDINGS: There is been interval right-sided thoracentesis with near complete resolution of right-sided pleural fluid. No pneumothorax is seen. The left lung remains clear. The cardiac shadow is stable.  IMPRESSION: No pneumothorax following right thoracentesis.   Electronically Signed   By: Alcide Clever M.D.   On: 07/25/2014 14:55   Dg Chest 2 View  07/24/2014   CLINICAL DATA:  Acute onset of sore throat, fever and generalized fatigue. Initial encounter.  EXAM: CHEST  2 VIEW  COMPARISON:  Chest radiograph performed 10/05/2006  FINDINGS: The lungs are well-aerated. A small to moderate right-sided pleural  effusion is noted, with associated atelectasis. Mild right midlung opacity is also seen. This may reflect pneumonia. The left lung appears clear. No pneumothorax is seen.  The heart is normal in size; the mediastinal contour is within normal limits. No acute osseous abnormalities are seen.  IMPRESSION: Small to moderate right-sided pleural effusion, with associated atelectasis. Mild right mid lung opacity also seen. This may reflect pneumonia, given the patient's symptoms. Followup PA and lateral chest X-ray is recommended in 3-4 weeks following trial of antibiotic therapy to ensure resolution and exclude underlying malignancy.   Electronically Signed   By: Roanna Raider M.D.   On: 07/24/2014 22:24   Ct Angio Chest Pe W/cm &/or Wo Cm  07/25/2014   CLINICAL DATA:  80 year old female with chest pain and abdominal pain. Fever, generalized weakness, chills and nonproductive cough.  EXAM: CT ANGIOGRAPHY CHEST  CT ABDOMEN AND PELVIS WITH CONTRAST  TECHNIQUE: Multidetector CT imaging of the chest was performed using the standard protocol during bolus administration of intravenous contrast. Multiplanar CT image reconstructions and MIPs were obtained to evaluate the vascular anatomy. Multidetector CT imaging of the abdomen and pelvis was performed using the standard protocol during bolus administration of intravenous contrast.  CONTRAST:  OMNIPAQUE IOHEXOL 350 MG/ML SOLN  COMPARISON:  Chest radiograph 1 day prior.  FINDINGS: CTA CHEST FINDINGS  There are no filling defects within the pulmonary arteries to suggest pulmonary embolus.  Ectasia of the transverse and proximal descending thoracic aorta without dissection. Mild atherosclerotic calcifications. Moderate multi chamber cardiomegaly.  There is a moderate-to-large simple right pleural effusion with small amount of fluid tracking  in the minor fissure. Adjacent compressive atelectasis involving the right lower and to a lesser extent middle lobes. There is no left  pleural effusion. No pericardial effusion. Enlarged right thyroid gland as heterogeneous consistent with goiter.  Central bronchial thickening. Mild ground-glass opacity in the left lower lobe. Scattered calcifications within the atelectatic right lower lobe, may reflect granulomas.  There are no acute or suspicious osseous abnormalities.  CT ABDOMEN and PELVIS FINDINGS  The gallbladder is physiologically distended. There is dilatation of the common bile duct measuring 13 mm proximally and 11 mm distally. No calcified gallstones or choledocholithiasis. There is central intrahepatic biliary ductal dilatation. No focal hepatic lesion. The pancreas is atrophic with mild prominence of the pancreatic duct measuring 3 mm. No peripancreatic inflammatory change. No definite focal pancreatic abnormality. The spleen is normal. There is no adrenal nodule.  Kidneys demonstrate symmetric enhancement and excretion common bilateral extrarenal pelvis configuration of both kidneys. Mild perinephric stranding noted about the right greater than left kidney. No intrarenal fluid collection.  The stomach is decompressed and not well evaluated. There are no dilated or thickened small bowel loops. No bowel obstruction, cecum is located in the central pelvis. There is oral contrast throughout the transverse colon. Moderate volume of stool in the distal colon without colonic wall thickening.  Abdominal aorta is normal in caliber, tortuous in its course with moderate atherosclerosis. No retroperitoneal adenopathy.  Within the pelvis the bladder is physiologically distended, there is a left lateral bladder diverticulum. No bladder wall thickening or perivesicular inflammatory change. The uterus is atrophic, normal for age. There is no adnexal mass. There is dilatation of the left ovarian vein. No pelvic adenopathy or free fluid.  Severe scoliotic curvature of the lumbar spine with associated degenerative change. Posttraumatic deformity at the  pubic symphysis. There are no acute or suspicious osseous abnormalities.  Review of the MIP images confirms the above findings.  IMPRESSION: 1. No pulmonary embolus. 2. Moderate to large simple right pleural effusion with adjacent compressive atelectasis. Bronchial thickening, ground-glass opacities in the left lower lobe are most consistent with pneumonitis, likely infectious or inflammatory. 3. Intra and extrahepatic biliary duct dilatation, with physiologic distension of gallbladder. The significance is uncertain, and correlation with LFTs is recommended. The pancreas is atrophic with mild prominence of the pancreatic duct, however discrete pancreatic mass is not seen. 4. Mild perinephric stranding about both kidneys, may be related to chronic renal disease versus urinary tract infection. 5. Chronic findings include ectatic atherosclerotic thoracic and abdominal aorta, right thyroid goiter, and moderate-sized left bladder diverticulum.   Electronically Signed   By: Melanie  Ehinger M.D.   On: 07/25/2014 04:08   Ct Abdomen Pelvis W Contrast  07/25/2014   CLINICAL DATA:  81 year old female with chest pain and abdominal pain. Fever, generalized weakness, chills and nonproductive cough.  EXAM: CT ANGIOGRAPHY CHEST  CT ABDOMEN AND PELVIS WITH CONTRAST  TECHNIQUE: Multidetector CT imaging of the chest was performed using the standard protocol during bolus administration of intravenous contrast. Multiplanar CT image reconstructions and MIPs were obtained to evaluate the vascular anatomy. Multidetector CT imaging of the abdomen and pelvis was performed using the standard protocol during bolus administration of intravenous contrast.  CONTRAST:  <MEASUREMENTENatashKoreMaxine Glenna bENatashKoreMaxine Glenna bENatashKoreMaxine GlennaGabriel CirriE IOHEXOL 350 MG/ML SOLN  COMPARISON:  Chest radiograph 1 day prior.  FINDINGS: CTA CHEST FINDINGS  There are no filling defects within the pulmonary arteries to suggest pulmonary embolus.  Ectasia of the transverse and proximal descending thoracic aorta without  dissection. Mild atherosclerotic  calcifications. Moderate multi chamber cardiomegaly.  There is a moderate-to-large simple right pleural effusion with small amount of fluid tracking in the minor fissure. Adjacent compressive atelectasis involving the right lower and to a lesser extent middle lobes. There is no left pleural effusion. No pericardial effusion. Enlarged right thyroid gland as heterogeneous consistent with goiter.  Central bronchial thickening. Mild ground-glass opacity in the left lower lobe. Scattered calcifications within the atelectatic right lower lobe, may reflect granulomas.  There are no acute or suspicious osseous abnormalities.  CT ABDOMEN and PELVIS FINDINGS  The gallbladder is physiologically distended. There is dilatation of the common bile duct measuring 13 mm proximally and 11 mm distally. No calcified gallstones or choledocholithiasis. There is central intrahepatic biliary ductal dilatation. No focal hepatic lesion. The pancreas is atrophic with mild prominence of the pancreatic duct measuring 3 mm. No peripancreatic inflammatory change. No definite focal pancreatic abnormality. The spleen is normal. There is no adrenal nodule.  Kidneys demonstrate symmetric enhancement and excretion common bilateral extrarenal pelvis configuration of both kidneys. Mild perinephric stranding noted about the right greater than left kidney. No intrarenal fluid collection.  The stomach is decompressed and not well evaluated. There are no dilated or thickened small bowel loops. No bowel obstruction, cecum is located in the central pelvis. There is oral contrast throughout the transverse colon. Moderate volume of stool in the distal colon without colonic wall thickening.  Abdominal aorta is normal in caliber, tortuous in its course with moderate atherosclerosis. No retroperitoneal adenopathy.  Within the pelvis the bladder is physiologically distended, there is a left lateral bladder diverticulum. No bladder  wall thickening or perivesicular inflammatory change. The uterus is atrophic, normal for age. There is no adnexal mass. There is dilatation of the left ovarian vein. No pelvic adenopathy or free fluid.  Severe scoliotic curvature of the lumbar spine with associated degenerative change. Posttraumatic deformity at the pubic symphysis. There are no acute or suspicious osseous abnormalities.  Review of the MIP images confirms the above findings.  IMPRESSION: 1. No pulmonary embolus. 2. Moderate to large simple right pleural effusion with adjacent compressive atelectasis. Bronchial thickening, ground-glass opacities in the left lower lobe are most consistent with pneumonitis, likely infectious or inflammatory. 3. Intra and extrahepatic biliary duct dilatation, with physiologic distension of gallbladder. The significance is uncertain, and correlation with LFTs is recommended. The pancreas is atrophic with mild prominence of the pancreatic duct, however discrete pancreatic mass is not seen. 4. Mild perinephric stranding about both kidneys, may be related to chronic renal disease versus urinary tract infection. 5. Chronic findings include ectatic atherosclerotic thoracic and abdominal aorta, right thyroid goiter, and moderate-sized left bladder diverticulum.   Electronically Signed   By: Rubye Oaks M.D.   On: 07/25/2014 04:08   US Thoracentesis Asp Pleural Space W/img Guide  07/25/2014   CLINICAL DATA:  Pleural effusion  EXAM: ULTRASOUND GUIDED RIGHT THORACENTESIS  COMPARISON:  Previous exams.  PROCEDURE: Using sterile technique, 8 mls 1% lidocaine was used to anesthetize the area, under direct ultrasound visualization, A Yeuh catheter was used to aspirate 620 mls of clear yellow fluid from the right pleural space. Fluid was sent for labs.  Patient tolerated procedure well.  IMPRESSION: Ultrasound-guided Right Thoracentesis.  No apparent complications.  RECOMMENDATIONS: Will check Chest Xray.  Read by:  Corrin Parker   PA-C   Electronically Signed   By: Irish Lack M.D.   On: 07/25/2014 14:51     Medical Consultants:    None.  Anti-Infectives:    Rocephin/azithromycin 07/25/14--->  Subjective:    [name] feels well other than feeling a bit weak. Appetite picking up. No significant shortness of breath. Occasional cough. No chest pains.  Objective:    Filed Vitals:   07/26/14 1433 07/26/14 1710 07/26/14 2148 07/27/14 0557  BP: 128/46 125/50 133/58 133/65  Pulse: 91 88 91 77  Temp: 99.1 F (37.3 C)  99.2 F (37.3 C) 98 F (36.7 C)  TempSrc: Oral   Oral  Resp: 16  16 18   Height:      Weight:      SpO2: 98%  96% 98%    Intake/Output Summary (Last 24 hours) at 07/27/14 0830 Last data filed at 07/27/14 0817  Gross per 24 hour  Intake   3180 ml  Output   1100 ml  Net   2080 ml    Exam: Gen:  NAD, thin Cardiovascular:  RRR, No M/R/G Respiratory:  Lungs course with scattered rhonchi Gastrointestinal:  Abdomen soft, tender RUQ, + BS Extremities:  No C/E/C   Data Reviewed:    Labs: Basic Metabolic Panel:  Recent Labs Lab 07/24/14 2145 07/25/14 0714  NA 135 137  K 3.9 3.9  CL 98* 102  CO2 26 26  GLUCOSE 124* 101*  BUN 20 15  CREATININE 0.93 0.78  CALCIUM 9.4 8.6*   GFR Estimated Creatinine Clearance: 37.2 mL/min (by C-G formula based on Cr of 0.78). Liver Function Tests:  Recent Labs Lab 07/25/14 0714  AST 19  ALT 9*  ALKPHOS 60  BILITOT 0.6  PROT 6.4*  6.2*  ALBUMIN 3.2*  3.1*   CBC:  Recent Labs Lab 07/24/14 2145 07/25/14 0714  WBC 10.6* 9.1  NEUTROABS 7.7  --   HGB 12.8 11.0*  HCT 38.5 33.4*  MCV 86.3 86.3  PLT 281 267   Cardiac Enzymes:  Recent Labs Lab 07/25/14 0031 07/25/14 0714 07/25/14 1230  TROPONINI <0.03 <0.03 <0.03   Hgb A1c:  Recent Labs  07/25/14 0031  HGBA1C 5.7*    Microbiology No results found for this or any previous visit (from the past 240 hour(s)).   Medications:   . azithromycin  500 mg  Intravenous Q24H  . carvedilol  3.125 mg Oral BID WC  . cefTRIAXone (ROCEPHIN)  IV  1 g Intravenous Q24H   Continuous Infusions: . sodium chloride 150 mL/hr at 07/24/14 2139  . sodium chloride 100 mL/hr at 07/26/14 2158    Time spent: 35 minutes with > 50% of time discussing current diagnostic test results, clinical impression and plan of care.    LOS: 3 days   RAMA,CHRISTINA  [facility] Pager [phone]. If unable to reach me by pager, please call my cell phone at [phone].  *Please refer to [email or web address], password [email or web address] to get updated schedule on who will round on this patient, as hospitalists switch teams weekly. If 7PM-7AM, please contact night-coverage at [email or web address], password [email or web address] for any overnight needs.  07/27/2014, 8:30 AM

## 2014-07-28 ENCOUNTER — Encounter (HOSPITAL_COMMUNITY): Payer: Self-pay | Admitting: Internal Medicine

## 2014-07-28 MED ORDER — CEFUROXIME AXETIL 500 MG PO TABS: 500.0000 mg | ORAL_TABLET | Freq: Two times a day (BID) | ORAL | Status: DC

## 2014-07-28 MED ORDER — CARVEDILOL 3.125 MG PO TABS: 3.1250 mg | ORAL_TABLET | Freq: Two times a day (BID) | ORAL | Status: DC

## 2014-07-28 NOTE — Evaluation (Signed)
Physical Therapy Evaluation Patient Details Name: [name] MRN: [id number] DOB: [birth year] Today's Date: 07/28/2014   History of Present Illness  81 yo female admitted with Pna, chest pain. Pt is from Independent Living  Clinical Impression  On eval, pt was supervision level assist for mobility-able to ambulate ~500 feet. Overall, supervision-Min guard level assist. Pt has walker available to her at home if needed. Recommend HHPT for home safety evaluation and follow up if needed. Feel pt should have meal delivered to her room this evening to allow her to rest (very long distance for her to walk) and she should be able to resume ambulation to and from dining room on tomorrow    Follow Up Recommendations Home health PT (at [facility])    Equipment Recommendations  None recommended by PT    Recommendations for Other Services       Precautions / Restrictions Precautions Precautions: None Restrictions Weight Bearing Restrictions: No      Mobility  Bed Mobility Overal bed mobility: Modified Independent                Transfers Overall transfer level: Modified independent                  Ambulation/Gait Ambulation/Gait assistance: Supervision Ambulation Distance (Feet): 500 Feet Assistive device: None Gait Pattern/deviations: Step-through pattern;Decreased stride length     General Gait Details: supervision for safety.   Stairs            Wheelchair Mobility    Modified Rankin (Stroke Patients Only)       Balance Overall balance assessment: Needs assistance         Standing balance support: No upper extremity supported;During functional activity Standing balance-Leahy Scale: Good Standing balance comment: Had pt perform static standing with EO/EC, withstanding of perturbations, narrow BOS-supervision level. 360 degree turn-supervision level             High level balance activites: Backward walking;Direction  changes;Turns;Head turns High Level Balance Comments: All with Min guard level.              Pertinent Vitals/Pain Pain Assessment: No/denies pain    Home Living Family/patient expects to be discharged to:: Private residence Living Arrangements: Alone   Type of Home: Independent living facility Home Access: Level entry       Home Equipment: Walker - 4 wheels      Prior Function Level of Independence: Independent               Hand Dominance        Extremity/Trunk Assessment   Upper Extremity Assessment: Overall WFL for tasks assessed           Lower Extremity Assessment: Overall WFL for tasks assessed      Cervical / Trunk Assessment: Kyphotic  Communication   Communication: No difficulties  Cognition Arousal/Alertness: Awake/alert Behavior During Therapy: WFL for tasks assessed/performed Overall Cognitive Status: Within Functional Limits for tasks assessed                      General Comments      Exercises        Assessment/Plan    PT Assessment All further PT needs can be met in the next venue of care (HHPT at [place] facility)  PT Diagnosis Difficulty walking   PT Problem List Decreased mobility;Decreased balance  PT Treatment Interventions Gait training;Balance training   PT Goals (Current goals can be found  in the Care Plan section) Acute Rehab PT Goals Patient Stated Goal: home  PT Goal Formulation: With patient/family Time For Goal Achievement: 08/04/14 Potential to Achieve Goals: Good    Frequency Min 3X/week   Barriers to discharge        Co-evaluation               End of Session Equipment Utilized During Treatment: Gait belt Activity Tolerance: Patient tolerated treatment well Patient left: in bed;with call bell/phone within reach;with family/visitor present           Time: 1140-1201 PT Time Calculation (min) (ACUTE ONLY): 21 min   Charges:   PT Evaluation $Initial PT Evaluation Tier I:  1 Procedure     PT G Codes:        Weston Anna, MPT Pager: [phone]

## 2014-07-28 NOTE — Discharge Summary (Signed)
Physician Discharge Summary  [name] JXB:[id number] DOB: 12/02/1924 DOA: 07/24/2014  PCP: Kimber Relic, MD  Admit date: 07/24/2014 Discharge date: 07/28/2014   Recommendations for Outpatient Follow-Up:   1. Please follow-up on pleural fluid cytology. Unfortunately, pleural fluid cultures were not sent. 2. Recommend repeat chest x-ray 4-6 weeks to exclude any underlying abnormalities.  Discharge Diagnosis:   Principal Problem:    CAP (community acquired pneumonia) Active Problems:    Hyperglycemia    Pleural effusion, right    Pneumonitis    Tricuspid valve regurgitation, mild-moderate    Mild-moderate pulmonary hypertension   Discharge disposition:  Home.    Discharge Condition: Improved.  Diet recommendation: Low sodium, heart healthy.   History of Present Illness:   [name] is an 81 y.o. female with a PMH of hypertension, hyperlipidemia and cerebrovascular disease who was admitted 07/24/14 with community-acquired pneumonia.  Hospital Course by Problem:   Principal Problem:  CAP (community acquired pneumonia) / right pleural effusion / pneumonitis - Treated with azithromycin and Rocephin with clinical improvement, will discharge on an additional 4 days of treatment with Ceftin. - CT of the chest ordered on admission showed right sided pneumonitis and a moderate-large pleural effusion. - Status post thoracentesis. Follow-up pleural fluid cytology, unfortunately cultures not sent. - Repeat CXR in 4-6 weeks to exclude any underlying pathology. - 2-D echocardiogram showed normal ventricular function.  Active Problems:  Chest pain - Troponins negative 3. - Continue aspirin and Crestor. Carvedilol started.   Hyperglycemia - Hemoglobin A1c 5.7%, corresponding to a mean plasma glucose of 117 consistent with prediabetes. - The patient was counseled about this finding.    Medical Consultants:    Interventional Radiology   Discharge Exam:     Filed Vitals:   07/27/14 2043  BP: 149/63  Pulse: 71  Temp: 98.3 F (36.8 C)  Resp: 18   Filed Vitals:   07/26/14 2148 07/27/14 0557 07/27/14 1424 07/27/14 2043  BP: 133/58 133/65 135/56 149/63  Pulse: 91 77 80 71  Temp: 99.2 F (37.3 C) 98 F (36.7 C) 98 F (36.7 C) 98.3 F (36.8 C)  TempSrc:  Oral Oral Oral  Resp: 16 18 18 18   Height:      Weight:      SpO2: 96% 98% 97% 99%    Gen:  NAD Cardiovascular:  RRR, No M/R/G Respiratory: Lungs CTAB Gastrointestinal: Abdomen soft, NT/ND with normal active bowel sounds. Extremities: No C/E/C   The results of significant diagnostics from this hospitalization (including imaging, microbiology, ancillary and laboratory) are listed below for reference.     Procedures and Diagnostic Studies:   Dg Chest 1 View  07/25/2014   CLINICAL DATA:  Status post right thoracentesis  EXAM: CHEST  1 VIEW  COMPARISON:  07/24/2014  FINDINGS: There is been interval right-sided thoracentesis with near complete resolution of right-sided pleural fluid. No pneumothorax is seen. The left lung remains clear. The cardiac shadow is stable.  IMPRESSION: No pneumothorax following right thoracentesis.   Electronically Signed   By: 07/26/2014 M.D.   On: 07/25/2014 14:55   Dg Chest 2 View  07/24/2014   CLINICAL DATA:  Acute onset of sore throat, fever and generalized fatigue. Initial encounter.  EXAM: CHEST  2 VIEW  COMPARISON:  Chest radiograph performed 10/05/2006  FINDINGS: The lungs are well-aerated. A small to moderate right-sided pleural effusion is noted, with associated atelectasis. Mild right midlung opacity is also seen. This may reflect pneumonia. The left  lung appears clear. No pneumothorax is seen.  The heart is normal in size; the mediastinal contour is within normal limits. No acute osseous abnormalities are seen.  IMPRESSION: Small to moderate right-sided pleural effusion, with associated atelectasis. Mild right mid lung opacity also seen. This may  reflect pneumonia, given the patient's symptoms. Followup PA and lateral chest X-ray is recommended in 3-4 weeks following trial of antibiotic therapy to ensure resolution and exclude underlying malignancy.   Electronically Signed   By: Roanna Raider M.D.   On: 07/24/2014 22:24   Ct Angio Chest Pe W/cm &/or Wo Cm  07/25/2014   CLINICAL DATA:  81 year old female with chest pain and abdominal pain. Fever, generalized weakness, chills and nonproductive cough.  EXAM: CT ANGIOGRAPHY CHEST  CT ABDOMEN AND PELVIS WITH CONTRAST  TECHNIQUE: Multidetector CT imaging of the chest was performed using the standard protocol during bolus administration of intravenous contrast. Multiplanar CT image reconstructions and MIPs were obtained to evaluate the vascular anatomy. Multidetector CT imaging of the abdomen and pelvis was performed using the standard protocol during bolus administration of intravenous contrast.  CONTRAST:  OMNIPAQUE IOHEXOL 350 MG/ML SOLN  COMPARISON:  Chest radiograph 1 day prior.  FINDINGS: CTA CHEST FINDINGS  There are no filling defects within the pulmonary arteries to suggest pulmonary embolus.  Ectasia of the transverse and proximal descending thoracic aorta without dissection. Mild atherosclerotic calcifications. Moderate multi chamber cardiomegaly.  There is a moderate-to-large simple right pleural effusion with small amount of fluid tracking in the minor fissure. Adjacent compressive atelectasis involving the right lower and to a lesser extent middle lobes. There is no left pleural effusion. No pericardial effusion. Enlarged right thyroid gland as heterogeneous consistent with goiter.  Central bronchial thickening. Mild ground-glass opacity in the left lower lobe. Scattered calcifications within the atelectatic right lower lobe, may reflect granulomas.  There are no acute or suspicious osseous abnormalities.  CT ABDOMEN and PELVIS FINDINGS  The gallbladder is physiologically distended. There is  dilatation of the common bile duct measuring 13 mm proximally and 11 mm distally. No calcified gallstones or choledocholithiasis. There is central intrahepatic biliary ductal dilatation. No focal hepatic lesion. The pancreas is atrophic with mild prominence of the pancreatic duct measuring 3 mm. No peripancreatic inflammatory change. No definite focal pancreatic abnormality. The spleen is normal. There is no adrenal nodule.  Kidneys demonstrate symmetric enhancement and excretion common bilateral extrarenal pelvis configuration of both kidneys. Mild perinephric stranding noted about the right greater than left kidney. No intrarenal fluid collection.  The stomach is decompressed and not well evaluated. There are no dilated or thickened small bowel loops. No bowel obstruction, cecum is located in the central pelvis. There is oral contrast throughout the transverse colon. Moderate volume of stool in the distal colon without colonic wall thickening.  Abdominal aorta is normal in caliber, tortuous in its course with moderate atherosclerosis. No retroperitoneal adenopathy.  Within the pelvis the bladder is physiologically distended, there is a left lateral bladder diverticulum. No bladder wall thickening or perivesicular inflammatory change. The uterus is atrophic, normal for age. There is no adnexal mass. There is dilatation of the left ovarian vein. No pelvic adenopathy or free fluid.  Severe scoliotic curvature of the lumbar spine with associated degenerative change. Posttraumatic deformity at the pubic symphysis. There are no acute or suspicious osseous abnormalities.  Review of the MIP images confirms the above findings.  IMPRESSION: 1. No pulmonary embolus. 2. Moderate to large simple right pleural effusion  with adjacent compressive atelectasis. Bronchial thickening, ground-glass opacities in the left lower lobe are most consistent with pneumonitis, likely infectious or inflammatory. 3. Intra and extrahepatic  biliary duct dilatation, with physiologic distension of gallbladder. The significance is uncertain, and correlation with LFTs is recommended. The pancreas is atrophic with mild prominence of the pancreatic duct, however discrete pancreatic mass is not seen. 4. Mild perinephric stranding about both kidneys, may be related to chronic renal disease versus urinary tract infection. 5. Chronic findings include ectatic atherosclerotic thoracic and abdominal aorta, right thyroid goiter, and moderate-sized left bladder diverticulum.   Electronically Signed   By: Rubye Oaks M.D.   On: 07/25/2014 04:08   Ct Abdomen Pelvis W Contrast  07/25/2014   CLINICAL DATA:  81 year old female with chest pain and abdominal pain. Fever, generalized weakness, chills and nonproductive cough.  EXAM: CT ANGIOGRAPHY CHEST  CT ABDOMEN AND PELVIS WITH CONTRAST  TECHNIQUE: Multidetector CT imaging of the chest was performed using the standard protocol during bolus administration of intravenous contrast. Multiplanar CT image reconstructions and MIPs were obtained to evaluate the vascular anatomy. Multidetector CT imaging of the abdomen and pelvis was performed using the standard protocol during bolus administration of intravenous contrast.  CONTRAST:  OMNIPAQUE IOHEXOL 350 MG/ML SOLN  COMPARISON:  Chest radiograph 1 day prior.  FINDINGS: CTA CHEST FINDINGS  There are no filling defects within the pulmonary arteries to suggest pulmonary embolus.  Ectasia of the transverse and proximal descending thoracic aorta without dissection. Mild atherosclerotic calcifications. Moderate multi chamber cardiomegaly.  There is a moderate-to-large simple right pleural effusion with small amount of fluid tracking in the minor fissure. Adjacent compressive atelectasis involving the right lower and to a lesser extent middle lobes. There is no left pleural effusion. No pericardial effusion. Enlarged right thyroid gland as heterogeneous consistent with  goiter.  Central bronchial thickening. Mild ground-glass opacity in the left lower lobe. Scattered calcifications within the atelectatic right lower lobe, may reflect granulomas.  There are no acute or suspicious osseous abnormalities.  CT ABDOMEN and PELVIS FINDINGS  The gallbladder is physiologically distended. There is dilatation of the common bile duct measuring 13 mm proximally and 11 mm distally. No calcified gallstones or choledocholithiasis. There is central intrahepatic biliary ductal dilatation. No focal hepatic lesion. The pancreas is atrophic with mild prominence of the pancreatic duct measuring 3 mm. No peripancreatic inflammatory change. No definite focal pancreatic abnormality. The spleen is normal. There is no adrenal nodule.  Kidneys demonstrate symmetric enhancement and excretion common bilateral extrarenal pelvis configuration of both kidneys. Mild perinephric stranding noted about the right greater than left kidney. No intrarenal fluid collection.  The stomach is decompressed and not well evaluated. There are no dilated or thickened small bowel loops. No bowel obstruction, cecum is located in the central pelvis. There is oral contrast throughout the transverse colon. Moderate volume of stool in the distal colon without colonic wall thickening.  Abdominal aorta is normal in caliber, tortuous in its course with moderate atherosclerosis. No retroperitoneal adenopathy.  Within the pelvis the bladder is physiologically distended, there is a left lateral bladder diverticulum. No bladder wall thickening or perivesicular inflammatory change. The uterus is atrophic, normal for age. There is no adnexal mass. There is dilatation of the left ovarian vein. No pelvic adenopathy or free fluid.  Severe scoliotic curvature of the lumbar spine with associated degenerative change. Posttraumatic deformity at the pubic symphysis. There are no acute or suspicious osseous abnormalities.  Review of the MIP  images  confirms the above findings.  IMPRESSION: 1. No pulmonary embolus. 2. Moderate to large simple right pleural effusion with adjacent compressive atelectasis. Bronchial thickening, ground-glass opacities in the left lower lobe are most consistent with pneumonitis, likely infectious or inflammatory. 3. Intra and extrahepatic biliary duct dilatation, with physiologic distension of gallbladder. The significance is uncertain, and correlation with LFTs is recommended. The pancreas is atrophic with mild prominence of the pancreatic duct, however discrete pancreatic mass is not seen. 4. Mild perinephric stranding about both kidneys, may be related to chronic renal disease versus urinary tract infection. 5. Chronic findings include ectatic atherosclerotic thoracic and abdominal aorta, right thyroid goiter, and moderate-sized left bladder diverticulum.   Electronically Signed   By: Rubye Oaks M.D.   On: 07/25/2014 04:08   US Thoracentesis Asp Pleural Space W/img Guide  07/25/2014   CLINICAL DATA:  Pleural effusion  EXAM: ULTRASOUND GUIDED RIGHT THORACENTESIS  COMPARISON:  Previous exams.  PROCEDURE: Using sterile technique, 8 mls 1% lidocaine was used to anesthetize the area, under direct ultrasound visualization, A Yeuh catheter was used to aspirate 620 mls of clear yellow fluid from the right pleural space. Fluid was sent for labs.  Patient tolerated procedure well.  IMPRESSION: Ultrasound-guided Right Thoracentesis.  No apparent complications.  RECOMMENDATIONS: Will check Chest Xray.  Read by:  Corrin Parker  PA-C   Electronically Signed   By: Irish Lack M.D.   On: 07/25/2014 14:51    2-D echocardiogram  07/25/14  Study Conclusions  - Left ventricle: The cavity size was normal. Systolic function was normal. The estimated ejection fraction was in the range of 55% to 60%. Wall motion was normal; there were no regional wall motion abnormalities. Left ventricular diastolic function parameters  were normal. - Mitral valve: Calcified annulus. - Tricuspid valve: There was mild-moderate regurgitation directed centrally. - Pulmonary arteries: Systolic pressure was mildly to moderately increased. PA peak pressure: 45 mm Hg (S).  Labs:   Basic Metabolic Panel:  Recent Labs Lab 07/24/14 2145 07/25/14 0714  NA 135 137  K 3.9 3.9  CL 98* 102  CO2 26 26  GLUCOSE 124* 101*  BUN 20 15  CREATININE 0.93 0.78  CALCIUM 9.4 8.6*   GFR Estimated Creatinine Clearance: 37.2 mL/min (by C-G formula based on Cr of 0.78). Liver Function Tests:  Recent Labs Lab 07/25/14 0714  AST 19  ALT 9*  ALKPHOS 60  BILITOT 0.6  PROT 6.4*  6.2*  ALBUMIN 3.2*  3.1*    CBC:  Recent Labs Lab 07/24/14 2145 07/25/14 0714  WBC 10.6* 9.1  NEUTROABS 7.7  --   HGB 12.8 11.0*  HCT 38.5 33.4*  MCV 86.3 86.3  PLT 281 267   Cardiac Enzymes:  Recent Labs Lab 07/25/14 0031 07/25/14 0714 07/25/14 1230  TROPONINI <0.03 <0.03 <0.03    Discharge Instructions:   Discharge Instructions    Call MD for:  difficulty breathing, headache or visual disturbances    Complete by:  As directed      Call MD for:  extreme fatigue    Complete by:  As directed      Call MD for:  temperature >100.4    Complete by:  As directed      Diet - low sodium heart healthy    Complete by:  As directed      Discharge instructions    Complete by:  As directed   You were treated for pneumonia while you were in the hospital.  It is important that you see your PCP in follow up, and have him/her order a follow up chest x-ray in 4-6 weeks to ensure resolution of the pneumonia and to exclude any underlying pathology.  Take all of your antibiotics, as prescribed, even if you feel better.  Do not discontinue antibiotics prematurely.     Increase activity slowly    Complete by:  As directed             Medication List    TAKE these medications        amLODipine 5 MG tablet  Commonly known as:  NORVASC    TAKE 1 TABLET DAILY FOR BLOOD PRESSURE.     aspirin 81 MG tablet  Take 81 mg by mouth daily.     carvedilol 3.125 MG tablet  Commonly known as:  COREG  Take 1 tablet (3.125 mg total) by mouth 2 (two) times daily with a meal.     cefUROXime 500 MG tablet  Commonly known as:  CEFTIN  Take 1 tablet (500 mg total) by mouth 2 (two) times daily with a meal.     cholecalciferol 1000 UNITS tablet  Commonly known as:  VITAMIN D  Take 1,000 Units by mouth daily. Take one tablet a daily     CRESTOR 10 MG tablet  Generic drug:  rosuvastatin  TAKE 1 TABLET DAILY TO LOWER CHOLESTEROL.     docusate sodium 100 MG capsule  Commonly known as:  COLACE  Take 100 mg by mouth. Take one tablet 3 times daily to prevent constipation     FISH OIL MAXIMUM STRENGTH 1200 MG Caps  Take 1 each by mouth. Take one daily     HYDROcodone-acetaminophen 5-325 MG per tablet  Commonly known as:  NORCO/VICODIN  Take 1 tablet by mouth 3 (three) times daily. Three times a day as needed for pain     ketorolac 0.4 % Soln  Commonly known as:  ACULAR  Place 1 drop into the left eye 3 (three) times daily.     Magnesium Gluconate 500 (27 MG) MG Tabs  Take 1 tablet by mouth. Take one tablet once daily     multivitamin per tablet  Take 1 tablet by mouth daily.     ofloxacin 0.3 % ophthalmic solution  Commonly known as:  OCUFLOX  Place 1 drop into the left eye 3 (three) times daily.     prednisoLONE acetate 1 % ophthalmic suspension  Commonly known as:  PRED FORTE  Place 1 drop into the left eye 3 (three) times daily.     PRESERVISION AREDS PO  Take 1 tablet by mouth 2 (two) times daily. Take one tablet twice daily for vision     vitamin E 400 UNIT capsule  Take 400 Units by mouth daily.           Follow-up Information    Follow up with GREEN, Lenon Curt, MD. Go on 08/04/2014.   Specialty:  Internal Medicine   Why:  at 9:30am   For Post Hospitalization Follow Up, pneumonia, and pleural effusion.  To be  seen at [facility] information:   [address] Jeanella Anton [place] [address] [phone]        Time coordinating discharge: 35 minutes.  Signed:  RAMA,CHRISTINA  Pager [phone] [facility] 07/28/2014, 4:30 PM

## 2014-07-28 NOTE — Discharge Summary (Signed)
Patient d/c home.meds reviewed, d/c instructions reviewed, all questions answered. Patient has clothing and cell phone.

## 2014-07-28 NOTE — Progress Notes (Signed)
Pt lives at [facility] who provide [facility] there. There are no needs at present time.

## 2014-08-04 ENCOUNTER — Encounter: Payer: Self-pay | Admitting: Internal Medicine

## 2014-08-04 ENCOUNTER — Non-Acute Institutional Stay: Payer: Medicare Other | Admitting: Internal Medicine

## 2014-08-04 VITALS — BP 120/60 | HR 60 | Temp 98.3°F | Wt 106.0 lb

## 2014-08-04 DIAGNOSIS — J90 Pleural effusion, not elsewhere classified: Secondary | ICD-10-CM

## 2014-08-04 DIAGNOSIS — I10 Essential (primary) hypertension: Secondary | ICD-10-CM | POA: Diagnosis not present

## 2014-08-04 DIAGNOSIS — R350 Frequency of micturition: Secondary | ICD-10-CM

## 2014-08-04 DIAGNOSIS — J948 Other specified pleural conditions: Secondary | ICD-10-CM | POA: Diagnosis not present

## 2014-08-04 NOTE — Progress Notes (Signed)
Patient ID: [name], female   DOB: 1924/04/03, 81 y.o.   MRN: [id number]    [facility]     [facility] Service[facility] (12[facility]     Allergies  Allergen Reactions  . Doxycycline Itching  . Lipitor [Atorvastatin] Other (See Comments)    PAIN IN LEGS    Chief Complaint  Patient presents with  . Hospitalization Follow-up    in hospital 5/15 to 5/19-16 for Pneumonia    HPI:  Here for follow-up after admission on 5/15-19 for CAP and right pleural effusion. During hospital course was treated with azithromycin and Rocephin with clinical improvement. Was discharged on an additional 4 days of treatment with Ceftin.  A CT of the chest done on admission showed right sided pneumonitis and a moderate-large pleural effusion. Thoracentesis was done. Pleural fluid cytology remains pending, unfortunately cultures were not sent. A 2-D echocardiogram was performed and showed normal ventricular function.Due to chest pain on admission Carvedilol was started.  Pleural effusion, right: No chest pain or shortness of breath.   Pneumonia: Completed Cefuroxime, tolerated well. Still feeling "run down, regaining strength gradually." No cough or fevers.  Urine frequency: Resolved. Reports since thoracentesis on 07/25/14 during hospitalization symptoms of pelvic discomfort, bladder fullness and urinary frequency have resolved.   Essential hypertension: Stable    Medications: Patient's Medications  New Prescriptions   No medications on file  Previous Medications   AMLODIPINE (NORVASC) 5 MG TABLET    TAKE 1 TABLET DAILY FOR BLOOD PRESSURE.   ASPIRIN 81 MG TABLET    Take 81 mg by mouth daily.     CARVEDILOL (COREG) 3.125 MG TABLET    Take 1 tablet (3.125 mg total) by mouth 2 (two) times daily with a meal.   CHOLECALCIFEROL (VITAMIN D) 1000 UNITS TABLET    Take 1,000 Units by mouth daily. Take one tablet a daily   CRESTOR 10 MG TABLET    TAKE 1 TABLET DAILY TO LOWER CHOLESTEROL.   DOCUSATE  SODIUM (COLACE) 100 MG CAPSULE    Take 100 mg by mouth. Take one tablet 3 times daily to prevent constipation   HYDROCODONE-ACETAMINOPHEN (NORCO/VICODIN) 5-325 MG PER TABLET    Take 1 tablet by mouth 3 (three) times daily. Three times a day as needed for pain   KETOROLAC (ACULAR) 0.4 % SOLN    Place 1 drop into the left eye 3 (three) times daily.    MAGNESIUM GLUCONATE 500 (27 MG) MG TABS    Take 1 tablet by mouth. Take one tablet once daily   MULTIPLE VITAMINS-MINERALS (PRESERVISION AREDS PO)    Take 1 tablet by mouth 2 (two) times daily. Take one tablet twice daily for vision   MULTIVITAMIN (THERAGRAN) PER TABLET    Take 1 tablet by mouth daily.     OFLOXACIN (OCUFLOX) 0.3 % OPHTHALMIC SOLUTION    Place 1 drop into the left eye 3 (three) times daily.    OMEGA-3 FATTY ACIDS (FISH OIL MAXIMUM STRENGTH) 1200 MG CAPS    Take 1 each by mouth. Take one daily   PREDNISOLONE ACETATE (PRED FORTE) 1 % OPHTHALMIC SUSPENSION    Place 1 drop into the left eye 3 (three) times daily.    VITAMIN E 400 UNIT CAPSULE    Take 400 Units by mouth daily.  Modified Medications   No medications on file  Discontinued Medications   CEFUROXIME (CEFTIN) 500 MG TABLET    Take 1 tablet (500 mg total) by mouth 2 (two)  times daily with a meal.     Review of Systems  Constitutional: Positive for fatigue and unexpected weight change (continues to lose weight). Negative for activity change and appetite change.  HENT: Positive for voice change (chronic dysphonia).        History of goiter.  Eyes: Negative.   Respiratory: Negative for shortness of breath.        Produces clear mucus with cough. Chronic hoarseness.  Cardiovascular: Negative for chest pain, palpitations and leg swelling.  Gastrointestinal:       Mild dysphagia  Endocrine: Negative.   Genitourinary: Negative.   Musculoskeletal: Positive for back pain.       Pains radiate from low back down the right leg.   Skin: Negative.   Neurological:       Remote hx  CVA.  Psychiatric/Behavioral: Negative.     Filed Vitals:   08/04/14 1000  BP: 120/60  Pulse: 60  Temp: 98.3 F (36.8 C)  TempSrc: Oral  Weight: 106 lb (48.081 kg)  SpO2: 91%   Body mass index is 19.38 kg/(m^2).  Physical Exam  Constitutional: She is oriented to person, place, and time. She appears well-developed. No distress.  Thin  HENT:  Head: Normocephalic.  Loss of hearing  Eyes: Conjunctivae and EOM are normal. Pupils are equal, round, and reactive to light.  Neck: Neck supple. No JVD present. No tracheal deviation present. Thyromegaly present.  Cardiovascular: Normal rate, regular rhythm, normal heart sounds and intact distal pulses.  Exam reveals no gallop and no friction rub.   No murmur heard. Bilateral mild varicose veins.  Pulmonary/Chest: Breath sounds normal. No respiratory distress. She has no wheezes. She has no rales.  Chronic hoarseness. Dullness with percussion of RLL  Abdominal: Bowel sounds are normal. She exhibits no distension and no mass. There is no tenderness.  Musculoskeletal: Normal range of motion. She exhibits no edema or tenderness.  Bony sacrum and coccyx prominent  Lymphadenopathy:    She has no cervical adenopathy.  Neurological: She is alert and oriented to person, place, and time. She has normal reflexes. No cranial nerve deficit. She exhibits normal muscle tone. Coordination normal.  Skin: No rash noted. No erythema. No pallor.  Psychiatric: She has a normal mood and affect. Her behavior is normal. Judgment and thought content normal.     Labs reviewed: Admission on 07/24/2014, Discharged on 07/28/2014  Component Date Value Ref Range Status  . Sodium 07/24/2014 135  135 - 145 mmol/L Final  . Potassium 07/24/2014 3.9  3.5 - 5.1 mmol/L Final  . Chloride 07/24/2014 98* 101 - 111 mmol/L Final  . CO2 07/24/2014 26  22 - 32 mmol/L Final  . Glucose, Bld 07/24/2014 124* 65 - 99 mg/dL Final  . BUN 07/24/2014 20  6 - 20 mg/dL Final  .  Creatinine, Ser 07/24/2014 0.93  0.44 - 1.00 mg/dL Final  . Calcium 07/24/2014 9.4  8.9 - 10.3 mg/dL Final  . GFR calc non Af Amer 07/24/2014 54* >60 mL/min Final  . GFR calc Af Amer 07/24/2014 >60  >60 mL/min Final   Comment: (NOTE) The eGFR has been calculated using the CKD EPI equation. This calculation has not been validated in all clinical situations. eGFR's persistently <60 mL/min signify possible Chronic Kidney Disease.   . Anion gap 07/24/2014 11  5 - 15 Final  . WBC 07/24/2014 10.6* 4.0 - 10.5 K/uL Final  . RBC 07/24/2014 4.46  3.87 - 5.11 MIL/uL Final  . Hemoglobin 07/24/2014 12.8  12.0 - 15.0 g/dL Final  . HCT 07/24/2014 38.5  36.0 - 46.0 % Final  . MCV 07/24/2014 86.3  78.0 - 100.0 fL Final  . MCH 07/24/2014 28.7  26.0 - 34.0 pg Final  . MCHC 07/24/2014 33.2  30.0 - 36.0 g/dL Final  . RDW 07/24/2014 14.7  11.5 - 15.5 % Final  . Platelets 07/24/2014 281  150 - 400 K/uL Final  . Neutrophils Relative % 07/24/2014 74  43 - 77 % Final  . Neutro Abs 07/24/2014 7.7  1.7 - 7.7 K/uL Final  . Lymphocytes Relative 07/24/2014 17  12 - 46 % Final  . Lymphs Abs 07/24/2014 1.8  0.7 - 4.0 K/uL Final  . Monocytes Relative 07/24/2014 8  3 - 12 % Final  . Monocytes Absolute 07/24/2014 0.9  0.1 - 1.0 K/uL Final  . Eosinophils Relative 07/24/2014 1  0 - 5 % Final  . Eosinophils Absolute 07/24/2014 0.2  0.0 - 0.7 K/uL Final  . Basophils Relative 07/24/2014 0  0 - 1 % Final  . Basophils Absolute 07/24/2014 0.0  0.0 - 0.1 K/uL Final  . Color, Urine 07/24/2014 YELLOW  YELLOW Final  . APPearance 07/24/2014 CLEAR  CLEAR Final  . Specific Gravity, Urine 07/24/2014 1.011  1.005 - 1.030 Final  . pH 07/24/2014 7.5  5.0 - 8.0 Final  . Glucose, UA 07/24/2014 NEGATIVE  NEGATIVE mg/dL Final  . Hgb urine dipstick 07/24/2014 NEGATIVE  NEGATIVE Final  . Bilirubin Urine 07/24/2014 NEGATIVE  NEGATIVE Final  . Ketones, ur 07/24/2014 15* NEGATIVE mg/dL Final  . Protein, ur 07/24/2014 NEGATIVE  NEGATIVE mg/dL  Final  . Urobilinogen, UA 07/24/2014 0.2  0.0 - 1.0 mg/dL Final  . Nitrite 07/24/2014 NEGATIVE  NEGATIVE Final  . Leukocytes, UA 07/24/2014 NEGATIVE  NEGATIVE Final   MICROSCOPIC NOT DONE ON URINES WITH NEGATIVE PROTEIN, BLOOD, LEUKOCYTES, NITRITE, OR GLUCOSE <1000 mg/dL.  . Troponin I 07/25/2014 <0.03  <0.031 ng/mL Final   Comment:        NO INDICATION OF MYOCARDIAL INJURY.   . Troponin I 07/25/2014 <0.03  <0.031 ng/mL Final   Comment:        NO INDICATION OF MYOCARDIAL INJURY.   . Troponin I 07/25/2014 <0.03  <0.031 ng/mL Final   Comment:        NO INDICATION OF MYOCARDIAL INJURY.   . Hgb A1c MFr Bld 07/25/2014 5.7* 4.8 - 5.6 % Final   Comment: (NOTE)         Pre-diabetes: 5.7 - 6.4         Diabetes: >6.4         Glycemic control for adults with diabetes: <7.0   . Mean Plasma Glucose 07/25/2014 117   Final   Comment: (NOTE) Performed At: [facility] [place], [place] [id number] Lindon Romp MD VX:[id number]   . WBC 07/25/2014 9.1  4.0 - 10.5 K/uL Final  . RBC 07/25/2014 3.87  3.87 - 5.11 MIL/uL Final  . Hemoglobin 07/25/2014 11.0* 12.0 - 15.0 g/dL Final  . HCT 07/25/2014 33.4* 36.0 - 46.0 % Final  . MCV 07/25/2014 86.3  78.0 - 100.0 fL Final  . MCH 07/25/2014 28.4  26.0 - 34.0 pg Final  . MCHC 07/25/2014 32.9  30.0 - 36.0 g/dL Final  . RDW 07/25/2014 14.8  11.5 - 15.5 % Final  . Platelets 07/25/2014 267  150 - 400 K/uL Final  . Sodium 07/25/2014 137  135 - 145 mmol/L Final  .  Potassium 07/25/2014 3.9  3.5 - 5.1 mmol/L Final  . Chloride 07/25/2014 102  101 - 111 mmol/L Final  . CO2 07/25/2014 26  22 - 32 mmol/L Final  . Glucose, Bld 07/25/2014 101* 65 - 99 mg/dL Final  . BUN 07/25/2014 15  6 - 20 mg/dL Final  . Creatinine, Ser 07/25/2014 0.78  0.44 - 1.00 mg/dL Final  . Calcium 07/25/2014 8.6* 8.9 - 10.3 mg/dL Final  . Total Protein 07/25/2014 6.2* 6.5 - 8.1 g/dL Final  . Albumin 07/25/2014 3.1* 3.5 - 5.0 g/dL Final  . AST 07/25/2014 19   15 - 41 U/L Final  . ALT 07/25/2014 9* 14 - 54 U/L Final  . Alkaline Phosphatase 07/25/2014 60  38 - 126 U/L Final  . Total Bilirubin 07/25/2014 0.6  0.3 - 1.2 mg/dL Final  . GFR calc non Af Amer 07/25/2014 >60  >60 mL/min Final  . GFR calc Af Amer 07/25/2014 >60  >60 mL/min Final   Comment: (NOTE) The eGFR has been calculated using the CKD EPI equation. This calculation has not been validated in all clinical situations. eGFR's persistently <60 mL/min signify possible Chronic Kidney Disease.   . Anion gap 07/25/2014 9  5 - 15 Final  . LDH 07/25/2014 141  98 - 192 U/L Final  . Total Protein 07/25/2014 6.4* 6.5 - 8.1 g/dL Final  . Albumin 07/25/2014 3.2* 3.5 - 5.0 g/dL Final  . Albumin, Fluid 07/25/2014 2.6   Final   Comment: (NOTE) No normal range established for this test Results should be evaluated in conjunction with serum values Performed at [facility]   . Fluid Type-FALB 07/25/2014 PLEURAL   Final  . Amylase, Pleural Fluid 07/25/2014 20   Final   Comment: (NOTE) Reference range: Values are considered abnormal if they are greater than or equal to two times a simultaneously analyzed serum value. Performed at [facility]   . Fluid Type-FCT 07/25/2014 PLEURAL   Final  . Color, Fluid 07/25/2014 YELLOW* YELLOW Final  . Appearance, Fluid 07/25/2014 CLOUDY* CLEAR Final  . WBC, Fluid 07/25/2014 953  0 - 1000 cu mm Final  . Neutrophil Count, Fluid 07/25/2014 1  0 - 25 % Final  . Lymphs, Fluid 07/25/2014 16   Final  . Monocyte-Macrophage-Serous Fluid 07/25/2014 40* 50 - 90 % Final  . Eos, Fluid 07/25/2014 43   Final  . Other Cells, Fluid 07/25/2014 CORRELATE WITH CYTOLOGY.   Final  . Glucose, Fluid 07/25/2014 96   Final   Comment: (NOTE) No normal range established for this test Results should be evaluated in conjunction with serum values Performed at [facility]   . Fluid Type-FGLU 07/25/2014 PLEURAL   Final  . LD, Fluid 07/25/2014 104* 3 - 23 U/L  Final   Comment: (NOTE) Results should be evaluated in conjunction with serum values Performed at [facility]   . Fluid Type-FLDH 07/25/2014 PLEURAL   Final  . pH, Fluid Type 07/25/2014 PLEURAL   Final  . pH, Fluid 07/25/2014 8.00   Final   Performed at [facility]   Dg Chest 1 View  07/25/2014 CLINICAL DATA: Status post right thoracentesis EXAM: CHEST 1 VIEW COMPARISON: 07/24/2014 FINDINGS: There is been interval right-sided thoracentesis with near complete resolution of right-sided pleural fluid. No pneumothorax is seen. The left lung remains clear. The cardiac shadow is stable. IMPRESSION: No pneumothorax following right thoracentesis. Electronically Signed By: Inez Catalina M.D. On: 07/25/2014 14:55   Dg Chest 2  View  07/24/2014 CLINICAL DATA: Acute onset of sore throat, fever and generalized fatigue. Initial encounter. EXAM: CHEST 2 VIEW COMPARISON: Chest radiograph performed 10/05/2006 FINDINGS: The lungs are well-aerated. A small to moderate right-sided pleural effusion is noted, with associated atelectasis. Mild right midlung opacity is also seen. This may reflect pneumonia. The left lung appears clear. No pneumothorax is seen. The heart is normal in size; the mediastinal contour is within normal limits. No acute osseous abnormalities are seen. IMPRESSION: Small to moderate right-sided pleural effusion, with associated atelectasis. Mild right mid lung opacity also seen. This may reflect pneumonia, given the patient's symptoms. Followup PA and lateral chest X-ray is recommended in 3-4 weeks following trial of antibiotic therapy to ensure resolution and exclude underlying malignancy. Electronically Signed By: Garald Balding M.D. On: 07/24/2014 22:24   Ct Angio Chest Pe W/cm &/or Wo Cm  07/25/2014 CLINICAL DATA: 81 year old female with chest pain and abdominal pain. Fever, generalized weakness, chills and nonproductive cough. EXAM: CT  ANGIOGRAPHY CHEST CT ABDOMEN AND PELVIS WITH CONTRAST TECHNIQUE: Multidetector CT imaging of the chest was performed using the standard protocol during bolus administration of intravenous contrast. Multiplanar CT image reconstructions and MIPs were obtained to evaluate the vascular anatomy. Multidetector CT imaging of the abdomen and pelvis was performed using the standard protocol during bolus administration of intravenous contrast. CONTRAST: 174m OMNIPAQUE IOHEXOL 350 MG/ML SOLN COMPARISON: Chest radiograph 1 day prior. FINDINGS: CTA CHEST FINDINGS There are no filling defects within the pulmonary arteries to suggest pulmonary embolus. Ectasia of the transverse and proximal descending thoracic aorta without dissection. Mild atherosclerotic calcifications. Moderate multi chamber cardiomegaly. There is a moderate-to-large simple right pleural effusion with small amount of fluid tracking in the minor fissure. Adjacent compressive atelectasis involving the right lower and to a lesser extent middle lobes. There is no left pleural effusion. No pericardial effusion. Enlarged right thyroid gland as heterogeneous consistent with goiter. Central bronchial thickening. Mild ground-glass opacity in the left lower lobe. Scattered calcifications within the atelectatic right lower lobe, may reflect granulomas. There are no acute or suspicious osseous abnormalities. CT ABDOMEN and PELVIS FINDINGS The gallbladder is physiologically distended. There is dilatation of the common bile duct measuring 13 mm proximally and 11 mm distally. No calcified gallstones or choledocholithiasis. There is central intrahepatic biliary ductal dilatation. No focal hepatic lesion. The pancreas is atrophic with mild prominence of the pancreatic duct measuring 3 mm. No peripancreatic inflammatory change. No definite focal pancreatic abnormality. The spleen is normal. There is no adrenal nodule. Kidneys demonstrate symmetric enhancement and  excretion common bilateral extrarenal pelvis configuration of both kidneys. Mild perinephric stranding noted about the right greater than left kidney. No intrarenal fluid collection. The stomach is decompressed and not well evaluated. There are no dilated or thickened small bowel loops. No bowel obstruction, cecum is located in the central pelvis. There is oral contrast throughout the transverse colon. Moderate volume of stool in the distal colon without colonic wall thickening. Abdominal aorta is normal in caliber, tortuous in its course with moderate atherosclerosis. No retroperitoneal adenopathy. Within the pelvis the bladder is physiologically distended, there is a left lateral bladder diverticulum. No bladder wall thickening or perivesicular inflammatory change. The uterus is atrophic, normal for age. There is no adnexal mass. There is dilatation of the left ovarian vein. No pelvic adenopathy or free fluid. Severe scoliotic curvature of the lumbar spine with associated degenerative change. Posttraumatic deformity at the pubic symphysis. There are no acute or suspicious osseous abnormalities. Review  of the MIP images confirms the above findings. IMPRESSION: 1. No pulmonary embolus. 2. Moderate to large simple right pleural effusion with adjacent compressive atelectasis. Bronchial thickening, ground-glass opacities in the left lower lobe are most consistent with pneumonitis, likely infectious or inflammatory. 3. Intra and extrahepatic biliary duct dilatation, with physiologic distension of gallbladder. The significance is uncertain, and correlation with LFTs is recommended. The pancreas is atrophic with mild prominence of the pancreatic duct, however discrete pancreatic mass is not seen. 4. Mild perinephric stranding about both kidneys, may be related to chronic renal disease versus urinary tract infection. 5. Chronic findings include ectatic atherosclerotic thoracic and abdominal aorta, right thyroid  goiter, and moderate-sized left bladder diverticulum. Electronically Signed By: Jeb Levering M.D. On: 07/25/2014 04:08   Ct Abdomen Pelvis W Contrast  07/25/2014 CLINICAL DATA: 81 year old female with chest pain and abdominal pain. Fever, generalized weakness, chills and nonproductive cough. EXAM: CT ANGIOGRAPHY CHEST CT ABDOMEN AND PELVIS WITH CONTRAST TECHNIQUE: Multidetector CT imaging of the chest was performed using the standard protocol during bolus administration of intravenous contrast. Multiplanar CT image reconstructions and MIPs were obtained to evaluate the vascular anatomy. Multidetector CT imaging of the abdomen and pelvis was performed using the standard protocol during bolus administration of intravenous contrast. CONTRAST: 126m OMNIPAQUE IOHEXOL 350 MG/ML SOLN COMPARISON: Chest radiograph 1 day prior. FINDINGS: CTA CHEST FINDINGS There are no filling defects within the pulmonary arteries to suggest pulmonary embolus. Ectasia of the transverse and proximal descending thoracic aorta without dissection. Mild atherosclerotic calcifications. Moderate multi chamber cardiomegaly. There is a moderate-to-large simple right pleural effusion with small amount of fluid tracking in the minor fissure. Adjacent compressive atelectasis involving the right lower and to a lesser extent middle lobes. There is no left pleural effusion. No pericardial effusion. Enlarged right thyroid gland as heterogeneous consistent with goiter. Central bronchial thickening. Mild ground-glass opacity in the left lower lobe. Scattered calcifications within the atelectatic right lower lobe, may reflect granulomas. There are no acute or suspicious osseous abnormalities. CT ABDOMEN and PELVIS FINDINGS The gallbladder is physiologically distended. There is dilatation of the common bile duct measuring 13 mm proximally and 11 mm distally. No calcified gallstones or choledocholithiasis. There is central  intrahepatic biliary ductal dilatation. No focal hepatic lesion. The pancreas is atrophic with mild prominence of the pancreatic duct measuring 3 mm. No peripancreatic inflammatory change. No definite focal pancreatic abnormality. The spleen is normal. There is no adrenal nodule. Kidneys demonstrate symmetric enhancement and excretion common bilateral extrarenal pelvis configuration of both kidneys. Mild perinephric stranding noted about the right greater than left kidney. No intrarenal fluid collection. The stomach is decompressed and not well evaluated. There are no dilated or thickened small bowel loops. No bowel obstruction, cecum is located in the central pelvis. There is oral contrast throughout the transverse colon. Moderate volume of stool in the distal colon without colonic wall thickening. Abdominal aorta is normal in caliber, tortuous in its course with moderate atherosclerosis. No retroperitoneal adenopathy. Within the pelvis the bladder is physiologically distended, there is a left lateral bladder diverticulum. No bladder wall thickening or perivesicular inflammatory change. The uterus is atrophic, normal for age. There is no adnexal mass. There is dilatation of the left ovarian vein. No pelvic adenopathy or free fluid. Severe scoliotic curvature of the lumbar spine with associated degenerative change. Posttraumatic deformity at the pubic symphysis. There are no acute or suspicious osseous abnormalities. Review of the MIP images confirms the above findings. IMPRESSION: 1. No pulmonary embolus.  2. Moderate to large simple right pleural effusion with adjacent compressive atelectasis. Bronchial thickening, ground-glass opacities in the left lower lobe are most consistent with pneumonitis, likely infectious or inflammatory. 3. Intra and extrahepatic biliary duct dilatation, with physiologic distension of gallbladder. The significance is uncertain, and correlation with LFTs is recommended. The  pancreas is atrophic with mild prominence of the pancreatic duct, however discrete pancreatic mass is not seen. 4. Mild perinephric stranding about both kidneys, may be related to chronic renal disease versus urinary tract infection. 5. Chronic findings include ectatic atherosclerotic thoracic and abdominal aorta, right thyroid goiter, and moderate-sized left bladder diverticulum. Electronically Signed By: Jeb Levering M.D. On: 07/25/2014 04:08   US Thoracentesis Asp Pleural Space W/img Guide  07/25/2014 CLINICAL DATA: Pleural effusion EXAM: ULTRASOUND GUIDED RIGHT THORACENTESIS COMPARISON: Previous exams. PROCEDURE: Using sterile technique, 8 mls 1% lidocaine was used to anesthetize the area, under direct ultrasound visualization, A Yeuh catheter was used to aspirate 620 mls of clear yellow fluid from the right pleural space. Fluid was sent for labs. Patient tolerated procedure well. IMPRESSION: Ultrasound-guided Right Thoracentesis. No apparent complications. RECOMMENDATIONS: Will check Chest Xray. Read by: Gareth Eagle PA-C Electronically Signed By: Aletta Edouard M.D. On: 07/25/2014 14:51   Assessment/Plan 1. Pleural effusion, right Cytology still pending, thoracentesis on 07/25/14 Follow up in 4 weeks. - DG Chest 2 View; Future  2. Urine frequency Resolved, CT abd on 07/25/14 showed acute findings  3. Essential hypertension Controlled

## 2014-08-05 DIAGNOSIS — J188 Other pneumonia, unspecified organism: Secondary | ICD-10-CM | POA: Diagnosis not present

## 2014-08-05 DIAGNOSIS — M6281 Muscle weakness (generalized): Secondary | ICD-10-CM | POA: Diagnosis not present

## 2014-08-05 DIAGNOSIS — R29898 Other symptoms and signs involving the musculoskeletal system: Secondary | ICD-10-CM | POA: Diagnosis not present

## 2014-08-09 DIAGNOSIS — M6281 Muscle weakness (generalized): Secondary | ICD-10-CM | POA: Diagnosis not present

## 2014-08-09 DIAGNOSIS — J188 Other pneumonia, unspecified organism: Secondary | ICD-10-CM | POA: Diagnosis not present

## 2014-08-09 DIAGNOSIS — R29898 Other symptoms and signs involving the musculoskeletal system: Secondary | ICD-10-CM | POA: Diagnosis not present

## 2014-08-15 ENCOUNTER — Ambulatory Visit
Admission: RE | Admit: 2014-08-15 | Discharge: 2014-08-15 | Disposition: A | Discharge status: Home / Self Care | Payer: Medicare Other | Source: Ambulatory Visit | Attending: Internal Medicine | Admitting: Internal Medicine

## 2014-08-15 DIAGNOSIS — R05 Cough: Secondary | ICD-10-CM | POA: Diagnosis not present

## 2014-08-15 DIAGNOSIS — J90 Pleural effusion, not elsewhere classified: Secondary | ICD-10-CM

## 2014-08-16 ENCOUNTER — Telehealth: Payer: Self-pay | Admitting: Internal Medicine

## 2014-08-16 ENCOUNTER — Telehealth: Payer: Self-pay

## 2014-08-16 NOTE — Telephone Encounter (Signed)
Left message for patient to call back for cytology report. Called [place] clinic nurse at [facility] left message that we got the report, trying to get in touch with patient.

## 2014-08-16 NOTE — Progress Notes (Signed)
This encounter was created in error - please disregard.

## 2014-08-16 NOTE — Telephone Encounter (Signed)
[place]iany clinic nurse from [facility] has patient in office asking about chest x-ray results, read Dr. Thomasene Lot message. Patient is c/o of some SOB, right flank pain, cough, she's starting to fell like she did when she had to have fluid removed from her lung. Dr. Chilton Si said if she gets worse she will have to go to the ER to have the fluid removed. There is no medication that we can give her, there is no sign of pneumonia. Vitals BP 128/72, pulse 74, 02 94% room air. Will call [facility] cytology lab

## 2014-08-16 NOTE — Telephone Encounter (Signed)
-----   Message from Kimber Relic, MD sent at 08/15/2014  3:31 PM EDT ----- Patient has appointment 09/01/2014. The cytology reports for fluid submitted from a thoracentesis during her last hospital stay have not returned. Can you contact the cytology lab at [facility] to see if we can get a report prior to her next visit. Inform patient that there is some reaccumulation of the right pleural fluid the previous chest x-ray after her thoracentesis. There is no evidence of a new pneumonia.

## 2014-08-16 NOTE — Telephone Encounter (Signed)
Ms. Barz called the on call line around 7pm returning CMA phone call to receive her cytology results from her pleural fluid.  She was very worried about her results so I checked the system to see if I was able to view them, but, they must have been sent via fax rather than epic.  I explained that Eunice Blase would be calling her with her results in the morning.  She expressed understanding.  She then asked about when she should know she needs another thoracentesis--we discussed s/s of worsening effusion.  She continued to be concerned and mentioned that her family is going out of town on Thursday and she wants to be able to give them the results and more information before that.  I recommended she come into the [facility] office to be seen sooner than 6/25 when she is now scheduled for a more complete assessment and so her questions can be answered more thoroughly.  She was advised to call our office to make an appt.  We also discussed the idea about moving to a higher level of care at [facility] temporarily where she could be closely monitored for progression of her effusion (seems like the ideal situation especially with her family out of town). She did not give her opinion on this and I'm not aware whether there is a rehab bed available there.

## 2014-08-17 NOTE — Telephone Encounter (Signed)
Called patient about cytology report from 07/25/14: No malignant cells identified, acute inflammation present. Patient had several questions, some SOB, felt she should see Dr. Chilton Si at [facility] clinic Thursday 08/18/14, made appointment at 1:15. Patient was happy with this suggestion.

## 2014-08-18 ENCOUNTER — Encounter: Payer: Self-pay | Admitting: Internal Medicine

## 2014-08-18 ENCOUNTER — Non-Acute Institutional Stay: Payer: Medicare Other | Admitting: Internal Medicine

## 2014-08-18 VITALS — BP 142/72 | HR 68 | Temp 97.8°F | Wt 104.0 lb

## 2014-08-18 DIAGNOSIS — I10 Essential (primary) hypertension: Secondary | ICD-10-CM

## 2014-08-18 DIAGNOSIS — J189 Pneumonia, unspecified organism: Secondary | ICD-10-CM | POA: Diagnosis not present

## 2014-08-18 DIAGNOSIS — J948 Other specified pleural conditions: Secondary | ICD-10-CM | POA: Diagnosis not present

## 2014-08-18 DIAGNOSIS — J90 Pleural effusion, not elsewhere classified: Secondary | ICD-10-CM

## 2014-08-18 NOTE — Progress Notes (Signed)
Patient ID: [name], female   DOB: June 15, 1924, 81 y.o.   MRN: [id number]    [facility]     [facility] Clinic (12)     Allergies  Allergen Reactions  . Doxycycline Itching  . Lipitor [Atorvastatin] Other (See Comments)    PAIN IN LEGS    Chief Complaint  Patient presents with  . Medical Management of Chronic Issues    cough, dyspnea, fatigue.     HPI:  Patient presents today for follow-up of complaints of dyspnea. Recent chest x-ray showed a slight increase in the right pleural effusion. Etiology of this effusion is not completely certain, but it is felt related to the pneumonia that she had in May 2016. Cytology reports have returned negative. Oxygen saturation is now 98% on room air.  Patient continues to have a rattling cough. It does not produce any significant quantities of sputum. Shortness of breath is mild to moderate. She is able walk more than 200 feet without stopping.  She has an appointment for cataract extraction on 08/25/2014 and is quite anxious about whether she should reschedule her go ahead with surgery.  Patient complains of anorexia, but weight is stable compared to most recent value.  Medications: Patient's Medications  New Prescriptions   No medications on file  Previous Medications   AMLODIPINE (NORVASC) 5 MG TABLET    TAKE 1 TABLET DAILY FOR BLOOD PRESSURE.   ASPIRIN 81 MG TABLET    Take 81 mg by mouth daily.     CARVEDILOL (COREG) 3.125 MG TABLET    Take 1 tablet (3.125 mg total) by mouth 2 (two) times daily with a meal.   CHOLECALCIFEROL (VITAMIN D) 1000 UNITS TABLET    Take 1,000 Units by mouth daily. Take one tablet a daily   CRESTOR 10 MG TABLET    TAKE 1 TABLET DAILY TO LOWER CHOLESTEROL.   DOCUSATE SODIUM (COLACE) 100 MG CAPSULE    Take 100 mg by mouth. Take one tablet 3 times daily to prevent constipation   HYDROCODONE-ACETAMINOPHEN (NORCO/VICODIN) 5-325 MG PER TABLET    Take 1 tablet by mouth 3 (three) times  daily. Three times a day as needed for pain   KETOROLAC (ACULAR) 0.4 % SOLN    Place 1 drop into the left eye 3 (three) times daily.    MAGNESIUM GLUCONATE 500 (27 MG) MG TABS    Take 1 tablet by mouth. Take one tablet once daily   MULTIPLE VITAMINS-MINERALS (PRESERVISION AREDS PO)    Take 1 tablet by mouth 2 (two) times daily. Take one tablet twice daily for vision   MULTIVITAMIN (THERAGRAN) PER TABLET    Take 1 tablet by mouth daily.     OFLOXACIN (OCUFLOX) 0.3 % OPHTHALMIC SOLUTION    Place 1 drop into the left eye 3 (three) times daily.    OMEGA-3 FATTY ACIDS (FISH OIL MAXIMUM STRENGTH) 1200 MG CAPS    Take 1 each by mouth. Take one daily   PREDNISOLONE ACETATE (PRED FORTE) 1 % OPHTHALMIC SUSPENSION    Place 1 drop into the left eye 3 (three) times daily.    VITAMIN E 400 UNIT CAPSULE    Take 400 Units by mouth daily.  Modified Medications   No medications on file  Discontinued Medications   No medications on file     Review of Systems  Constitutional: Positive for fatigue. Negative for activity change and appetite change.  HENT: Positive for voice change (chronic dysphonia).  History of goiter.  Eyes:       Cataracts  Respiratory: Positive for cough and shortness of breath.        Right pleural effusion. Pneumonia in May 2016 Produces clear mucus with cough. Chronic hoarseness.  Cardiovascular: Negative for chest pain, palpitations and leg swelling.  Gastrointestinal:       Mild dysphagia and anorexia.  Endocrine: Negative.   Genitourinary: Negative.   Musculoskeletal: Positive for back pain.       Pains radiate from low back down the right leg.   Skin: Negative.   Neurological:       Remote hx CVA.  Psychiatric/Behavioral: The patient is nervous/anxious.     Filed Vitals:   08/18/14 1344  BP: 142/72  Pulse: 68  Temp: 97.8 F (36.6 C)  TempSrc: Oral  Weight: 104 lb (47.174 kg)  SpO2: 98%   Body mass index is 19.02 kg/(m^2).  Physical Exam  Constitutional:  She is oriented to person, place, and time. She appears well-developed. No distress.  Thin  HENT:  Head: Normocephalic.  Loss of hearing  Eyes: Conjunctivae and EOM are normal. Pupils are equal, round, and reactive to light.  Neck: Neck supple. No JVD present. No tracheal deviation present. Thyromegaly present.  Cardiovascular: Normal rate, regular rhythm, normal heart sounds and intact distal pulses.  Exam reveals no gallop and no friction rub.   No murmur heard. Bilateral mild varicose veins.  Pulmonary/Chest: Breath sounds normal. No respiratory distress. She has no wheezes. She has no rales.  Chronic hoarseness. Dullness with percussion of RLL. Absent breath sounds in right lower lobe posteriorly.  Abdominal: Bowel sounds are normal. She exhibits no distension and no mass. There is no tenderness.  Musculoskeletal: Normal range of motion. She exhibits no edema or tenderness.  Bony sacrum and coccyx prominent  Lymphadenopathy:    She has no cervical adenopathy.  Neurological: She is alert and oriented to person, place, and time. She has normal reflexes. No cranial nerve deficit. She exhibits normal muscle tone. Coordination normal.  Skin: No rash noted. No erythema. No pallor.  Psychiatric: She has a normal mood and affect. Her behavior is normal. Judgment and thought content normal.     Labs reviewed: Admission on 07/24/2014, Discharged on 07/28/2014  Component Date Value Ref Range Status  . Sodium 07/24/2014 135  135 - 145 mmol/L Final  . Potassium 07/24/2014 3.9  3.5 - 5.1 mmol/L Final  . Chloride 07/24/2014 98* 101 - 111 mmol/L Final  . CO2 07/24/2014 26  22 - 32 mmol/L Final  . Glucose, Bld 07/24/2014 124* 65 - 99 mg/dL Final  . BUN 07/24/2014 20  6 - 20 mg/dL Final  . Creatinine, Ser 07/24/2014 0.93  0.44 - 1.00 mg/dL Final  . Calcium 07/24/2014 9.4  8.9 - 10.3 mg/dL Final  . GFR calc non Af Amer 07/24/2014 54* >60 mL/min Final  . GFR calc Af Amer 07/24/2014 >60  >60  mL/min Final   Comment: (NOTE) The eGFR has been calculated using the CKD EPI equation. This calculation has not been validated in all clinical situations. eGFR's persistently <60 mL/min signify possible Chronic Kidney Disease.   . Anion gap 07/24/2014 11  5 - 15 Final  . WBC 07/24/2014 10.6* 4.0 - 10.5 K/uL Final  . RBC 07/24/2014 4.46  3.87 - 5.11 MIL/uL Final  . Hemoglobin 07/24/2014 12.8  12.0 - 15.0 g/dL Final  . HCT 07/24/2014 38.5  36.0 - 46.0 % Final  . MCV  07/24/2014 86.3  78.0 - 100.0 fL Final  . MCH 07/24/2014 28.7  26.0 - 34.0 pg Final  . MCHC 07/24/2014 33.2  30.0 - 36.0 g/dL Final  . RDW 07/24/2014 14.7  11.5 - 15.5 % Final  . Platelets 07/24/2014 281  150 - 400 K/uL Final  . Neutrophils Relative % 07/24/2014 74  43 - 77 % Final  . Neutro Abs 07/24/2014 7.7  1.7 - 7.7 K/uL Final  . Lymphocytes Relative 07/24/2014 17  12 - 46 % Final  . Lymphs Abs 07/24/2014 1.8  0.7 - 4.0 K/uL Final  . Monocytes Relative 07/24/2014 8  3 - 12 % Final  . Monocytes Absolute 07/24/2014 0.9  0.1 - 1.0 K/uL Final  . Eosinophils Relative 07/24/2014 1  0 - 5 % Final  . Eosinophils Absolute 07/24/2014 0.2  0.0 - 0.7 K/uL Final  . Basophils Relative 07/24/2014 0  0 - 1 % Final  . Basophils Absolute 07/24/2014 0.0  0.0 - 0.1 K/uL Final  . Color, Urine 07/24/2014 YELLOW  YELLOW Final  . APPearance 07/24/2014 CLEAR  CLEAR Final  . Specific Gravity, Urine 07/24/2014 1.011  1.005 - 1.030 Final  . pH 07/24/2014 7.5  5.0 - 8.0 Final  . Glucose, UA 07/24/2014 NEGATIVE  NEGATIVE mg/dL Final  . Hgb urine dipstick 07/24/2014 NEGATIVE  NEGATIVE Final  . Bilirubin Urine 07/24/2014 NEGATIVE  NEGATIVE Final  . Ketones, ur 07/24/2014 15* NEGATIVE mg/dL Final  . Protein, ur 07/24/2014 NEGATIVE  NEGATIVE mg/dL Final  . Urobilinogen, UA 07/24/2014 0.2  0.0 - 1.0 mg/dL Final  . Nitrite 07/24/2014 NEGATIVE  NEGATIVE Final  . Leukocytes, UA 07/24/2014 NEGATIVE  NEGATIVE Final   MICROSCOPIC NOT DONE ON URINES  WITH NEGATIVE PROTEIN, BLOOD, LEUKOCYTES, NITRITE, OR GLUCOSE <1000 mg/dL.  . Troponin I 07/25/2014 <0.03  <0.031 ng/mL Final   Comment:        NO INDICATION OF MYOCARDIAL INJURY.   . Troponin I 07/25/2014 <0.03  <0.031 ng/mL Final   Comment:        NO INDICATION OF MYOCARDIAL INJURY.   . Troponin I 07/25/2014 <0.03  <0.031 ng/mL Final   Comment:        NO INDICATION OF MYOCARDIAL INJURY.   . Hgb A1c MFr Bld 07/25/2014 5.7* 4.8 - 5.6 % Final   Comment: (NOTE)         Pre-diabetes: 5.7 - 6.4         Diabetes: >6.4         Glycemic control for adults with diabetes: <7.0   . Mean Plasma Glucose 07/25/2014 117   Final   Comment: (NOTE) Performed At: [facility] [place], [place] [id number] Lindon Romp MD GY:[id number]   . WBC 07/25/2014 9.1  4.0 - 10.5 K/uL Final  . RBC 07/25/2014 3.87  3.87 - 5.11 MIL/uL Final  . Hemoglobin 07/25/2014 11.0* 12.0 - 15.0 g/dL Final  . HCT 07/25/2014 33.4* 36.0 - 46.0 % Final  . MCV 07/25/2014 86.3  78.0 - 100.0 fL Final  . MCH 07/25/2014 28.4  26.0 - 34.0 pg Final  . MCHC 07/25/2014 32.9  30.0 - 36.0 g/dL Final  . RDW 07/25/2014 14.8  11.5 - 15.5 % Final  . Platelets 07/25/2014 267  150 - 400 K/uL Final  . Sodium 07/25/2014 137  135 - 145 mmol/L Final  . Potassium 07/25/2014 3.9  3.5 - 5.1 mmol/L Final  . Chloride 07/25/2014 102  101 - 111 mmol/L  Final  . CO2 07/25/2014 26  22 - 32 mmol/L Final  . Glucose, Bld 07/25/2014 101* 65 - 99 mg/dL Final  . BUN 07/25/2014 15  6 - 20 mg/dL Final  . Creatinine, Ser 07/25/2014 0.78  0.44 - 1.00 mg/dL Final  . Calcium 07/25/2014 8.6* 8.9 - 10.3 mg/dL Final  . Total Protein 07/25/2014 6.2* 6.5 - 8.1 g/dL Final  . Albumin 07/25/2014 3.1* 3.5 - 5.0 g/dL Final  . AST 07/25/2014 19  15 - 41 U/L Final  . ALT 07/25/2014 9* 14 - 54 U/L Final  . Alkaline Phosphatase 07/25/2014 60  38 - 126 U/L Final  . Total Bilirubin 07/25/2014 0.6  0.3 - 1.2 mg/dL Final  . GFR calc non Af Amer  07/25/2014 >60  >60 mL/min Final  . GFR calc Af Amer 07/25/2014 >60  >60 mL/min Final   Comment: (NOTE) The eGFR has been calculated using the CKD EPI equation. This calculation has not been validated in all clinical situations. eGFR's persistently <60 mL/min signify possible Chronic Kidney Disease.   . Anion gap 07/25/2014 9  5 - 15 Final  . LDH 07/25/2014 141  98 - 192 U/L Final  . Total Protein 07/25/2014 6.4* 6.5 - 8.1 g/dL Final  . Albumin 07/25/2014 3.2* 3.5 - 5.0 g/dL Final  . Albumin, Fluid 07/25/2014 2.6   Final   Comment: (NOTE) No normal range established for this test Results should be evaluated in conjunction with serum values Performed at [facility]   . Fluid Type-FALB 07/25/2014 PLEURAL   Final  . Amylase, Pleural Fluid 07/25/2014 20   Final   Comment: (NOTE) Reference range: Values are considered abnormal if they are greater than or equal to two times a simultaneously analyzed serum value. Performed at [facility]   . Fluid Type-FCT 07/25/2014 PLEURAL   Final  . Color, Fluid 07/25/2014 YELLOW* YELLOW Final  . Appearance, Fluid 07/25/2014 CLOUDY* CLEAR Final  . WBC, Fluid 07/25/2014 953  0 - 1000 cu mm Final  . Neutrophil Count, Fluid 07/25/2014 1  0 - 25 % Final  . Lymphs, Fluid 07/25/2014 16   Final  . Monocyte-Macrophage-Serous Fluid 07/25/2014 40* 50 - 90 % Final  . Eos, Fluid 07/25/2014 43   Final  . Other Cells, Fluid 07/25/2014 CORRELATE WITH CYTOLOGY.   Final  . Glucose, Fluid 07/25/2014 96   Final   Comment: (NOTE) No normal range established for this test Results should be evaluated in conjunction with serum values Performed at [facility]   . Fluid Type-FGLU 07/25/2014 PLEURAL   Final  . LD, Fluid 07/25/2014 104* 3 - 23 U/L Final   Comment: (NOTE) Results should be evaluated in conjunction with serum values Performed at [facility]   . Fluid Type-FLDH 07/25/2014 PLEURAL   Final  . pH, Fluid Type 07/25/2014  PLEURAL   Final  . pH, Fluid 07/25/2014 8.00   Final   Performed at [facility]    Dg Chest 2 View  08/15/2014   CLINICAL DATA:  History of pneumonia and right pleural effusion requiring recent thoracentesis. Cough for last 3 days.  EXAM: CHEST - 2 VIEW  COMPARISON:  Chest x-ray on 07/25/2014 as well as imaging by ultrasound during a right thoracentesis procedure on 07/25/2014.  FINDINGS: There is some reaccumulation of right pleural fluid since the prior chest x-ray immediately post thoracentesis. Right-sided pleural fluid volume is small to moderate in size. There is some associated atelectasis  at the right lung base without definitive new pneumonia identified. No edema, pneumothorax or left-sided pleural fluid identified. The heart size and mediastinal contours are normal.  IMPRESSION: Some interval reaccumulation of right pleural fluid since thoracentesis. A small to moderate-sized right pleural effusion is present. There is associated atelectasis at the right lung base.   Electronically Signed   By: Aletta Edouard M.D.   On: 08/15/2014 14:34   Assessment/Plan  1. Pleural effusion, right re accumulating -pulmonary consult  2. CAP (community acquired pneumonia) resolved  3. Essential hypertension Controlled   Advised patient she could go ahead with her cataract surgery as planned.

## 2014-08-23 DIAGNOSIS — I10 Essential (primary) hypertension: Secondary | ICD-10-CM | POA: Diagnosis not present

## 2014-08-23 DIAGNOSIS — J948 Other specified pleural conditions: Secondary | ICD-10-CM | POA: Diagnosis not present

## 2014-08-23 LAB — BASIC METABOLIC PANEL
BUN: 15 mg/dL (ref 4–21)
Creatinine: 0.9 mg/dL (ref 0.5–1.1)
Glucose: 97 mg/dL
Potassium: 4.3 mmol/L (ref 3.4–5.3)
Sodium: 139 mmol/L (ref 137–147)

## 2014-08-23 LAB — CBC AND DIFFERENTIAL
HEMATOCRIT: 37 % (ref 36–46)
HEMOGLOBIN: 12.1 g/dL (ref 12.0–16.0)
PLATELETS: 303 10*3/uL (ref 150–399)
WBC: 7.1 10^3/mL

## 2014-08-24 ENCOUNTER — Encounter: Payer: Self-pay | Admitting: *Deleted

## 2014-08-25 DIAGNOSIS — H2512 Age-related nuclear cataract, left eye: Secondary | ICD-10-CM | POA: Diagnosis not present

## 2014-08-25 DIAGNOSIS — Z961 Presence of intraocular lens: Secondary | ICD-10-CM | POA: Diagnosis not present

## 2014-08-31 ENCOUNTER — Ambulatory Visit (INDEPENDENT_AMBULATORY_CARE_PROVIDER_SITE_OTHER)
Admission: RE | Admit: 2014-08-31 | Discharge: 2014-08-31 | Disposition: A | Discharge status: Home / Self Care | Payer: Medicare Other | Source: Ambulatory Visit | Attending: Internal Medicine | Admitting: Internal Medicine

## 2014-08-31 ENCOUNTER — Ambulatory Visit (INDEPENDENT_AMBULATORY_CARE_PROVIDER_SITE_OTHER): Payer: Medicare Other | Admitting: Internal Medicine

## 2014-08-31 ENCOUNTER — Other Ambulatory Visit (INDEPENDENT_AMBULATORY_CARE_PROVIDER_SITE_OTHER): Payer: Medicare Other

## 2014-08-31 ENCOUNTER — Encounter: Payer: Self-pay | Admitting: Internal Medicine

## 2014-08-31 VITALS — BP 114/62 | HR 71 | Ht 62.0 in | Wt 104.4 lb

## 2014-08-31 DIAGNOSIS — R0600 Dyspnea, unspecified: Secondary | ICD-10-CM

## 2014-08-31 DIAGNOSIS — J90 Pleural effusion, not elsewhere classified: Secondary | ICD-10-CM | POA: Diagnosis not present

## 2014-08-31 DIAGNOSIS — J9811 Atelectasis: Secondary | ICD-10-CM | POA: Diagnosis not present

## 2014-08-31 DIAGNOSIS — J948 Other specified pleural conditions: Secondary | ICD-10-CM

## 2014-08-31 LAB — HEPATIC FUNCTION PANEL
ALBUMIN: 4.2 g/dL (ref 3.5–5.2)
ALK PHOS: 64 U/L (ref 39–117)
ALT: 11 U/L (ref 0–35)
AST: 21 U/L (ref 0–37)
Bilirubin, Direct: 0.1 mg/dL (ref 0.0–0.3)
TOTAL PROTEIN: 7.8 g/dL (ref 6.0–8.3)
Total Bilirubin: 0.5 mg/dL (ref 0.2–1.2)

## 2014-08-31 LAB — TSH: TSH: 1.35 u[IU]/mL (ref 0.35–4.50)

## 2014-08-31 LAB — SEDIMENTATION RATE: Sed Rate: 27 mm/hr — ABNORMAL HIGH (ref 0–22)

## 2014-08-31 LAB — BASIC METABOLIC PANEL
BUN: 22 mg/dL (ref 6–23)
CALCIUM: 9.7 mg/dL (ref 8.4–10.5)
CO2: 29 mEq/L (ref 19–32)
Chloride: 95 mEq/L — ABNORMAL LOW (ref 96–112)
Creatinine, Ser: 0.98 mg/dL (ref 0.40–1.20)
GFR: 56.93 mL/min — ABNORMAL LOW (ref >60.00)
Glucose, Bld: 74 mg/dL (ref 70–99)
Potassium: 4.5 mEq/L (ref 3.5–5.1)
SODIUM: 132 meq/L — AB (ref 135–145)

## 2014-08-31 LAB — CBC WITH DIFFERENTIAL/PLATELET
Basophils Absolute: 0.1 10*3/uL (ref 0.0–0.1)
Basophils Relative: 0.6 % (ref 0.0–3.0)
EOS PCT: 2.2 % (ref 0.0–5.0)
Eosinophils Absolute: 0.2 10*3/uL (ref 0.0–0.7)
HEMATOCRIT: 38.4 % (ref 36.0–46.0)
Hemoglobin: 12.6 g/dL (ref 12.0–15.0)
LYMPHS ABS: 3.8 10*3/uL (ref 0.7–4.0)
Lymphocytes Relative: 45 % (ref 12.0–46.0)
MCHC: 32.9 g/dL (ref 30.0–36.0)
MCV: 86 fl (ref 78.0–100.0)
MONO ABS: 0.7 10*3/uL (ref 0.1–1.0)
Monocytes Relative: 8.3 % (ref 3.0–12.0)
Neutro Abs: 3.7 10*3/uL (ref 1.4–7.7)
Neutrophils Relative %: 43.9 % (ref 43.0–77.0)
Platelets: 323 10*3/uL (ref 150.0–400.0)
RBC: 4.47 Mil/uL (ref 3.87–5.11)
RDW: 15.4 % (ref 11.5–15.5)
WBC: 8.4 10*3/uL (ref 4.0–10.5)

## 2014-08-31 LAB — BRAIN NATRIURETIC PEPTIDE: PRO B NATRI PEPTIDE: 141 pg/mL — AB (ref 0.0–100.0)

## 2014-08-31 NOTE — Patient Instructions (Addendum)
Please remember to go to the lab and x-ray department downstairs for your tests - we will call you with the results when they are available.  Please see patient coordinator before you leave today  to schedule repeat R thoracentesis Friday the 24th in the afternoon

## 2014-08-31 NOTE — Progress Notes (Signed)
Subjective:    Patient ID: [name], female    DOB: October 29, 1924,    MRN: [id number]  HPI  81 yowf never smoker  S/p admit    Admit date: 07/24/2014 Discharge date: 07/28/2014   Recommendations for Outpatient Follow-Up:   1. Please follow-up on pleural fluid cytology. Unfortunately, pleural fluid cultures were not sent. 2. Recommend repeat chest x-ray 4-6 weeks to exclude any underlying abnormalities.  Discharge Diagnosis:   Principal Problem:   CAP (community acquired pneumonia) Active Problems:   Hyperglycemia   Pleural effusion, right   Pneumonitis   Tricuspid valve regurgitation, mild-moderate   Mild-moderate pulmonary hypertension   Discharge disposition: Home.   Discharge Condition: Improved.  Diet recommendation: Low sodium, heart healthy.   History of Present Illness:   [name] is an 81 y.o. female with a PMH of hypertension, hyperlipidemia and cerebrovascular disease who was admitted 07/24/14 with sore throat/chills/fever/ R pleuritic cp with new R effusion   Hospital Course by Problem:   Principal Problem:  CAP (community acquired pneumonia) / right pleural effusion / pneumonitis - Treated with azithromycin and Rocephin with clinical improvement, will discharge on an additional 4 days of treatment with Ceftin. - CT of the chest ordered on admission showed right sided pneumonitis and a moderate-large pleural effusion. - Status post thoracentesis. Follow-up pleural fluid cytology, unfortunately cultures not sent. - Repeat CXR in 4-6 weeks to exclude any underlying pathology. - 2-D echocardiogram showed normal ventricular function.  Active Problems:  Chest pain - Troponins negative 3. - Continue aspirin and Crestor. Carvedilol started.   Hyperglycemia - Hemoglobin A1c 5.7%, corresponding to a mean plasma glucose of 117 consistent with prediabetes. - The patient was counseled about this finding           08/31/2014 1st [facility] office visit/ Sulay Brymer   Chief Complaint  Patient presents with  . Pulmonary Consult    Referred by Dr. Murray Hodgkins for eval of pleural effusion. Pt recently hospitalized for PNA and pleural effusion- had thora done 07/25/14. She c/o feeling SOB "pretty much all the time". She has occ cough- prod with clear sputum.    breathing better but not back to baseline as still sense she can't get a deep breath   Ok lying flat/ sleeps fine/ cough is daytime  Note never documented temp above 99.9 during admit and none since d/c reported   No obvious other patterns in day to day or daytime variabilty or assoc   cp or chest tightness, subjective wheeze overt sinus or hb symptoms. No unusual exp hx or h/o childhood pna/ asthma or knowledge of premature birth.  Sleeping ok without nocturnal  or early am exacerbation  of respiratory  c/o's or need for noct saba. Also denies any obvious fluctuation of symptoms with weather or environmental changes or other aggravating or alleviating factors except as outlined above   Current Medications, Allergies, Complete Past Medical History, Past Surgical History, Family History, and Social History were reviewed in [employer] record.             Review of Systems  Constitutional: Positive for unexpected weight change. Negative for fever and chills.  HENT: Negative for congestion, dental problem, ear pain, nosebleeds, postnasal drip, rhinorrhea, sinus pressure, sneezing, sore throat, trouble swallowing and voice change.   Eyes: Negative for visual disturbance.  Respiratory: Positive for cough and shortness of breath. Negative for choking.   Cardiovascular: Negative for chest pain and leg swelling.  Gastrointestinal: Negative for vomiting, abdominal pain and diarrhea.  Genitourinary: Negative for difficulty urinating.  Musculoskeletal: Positive for arthralgias.  Skin: Negative for rash.  Neurological:  Negative for tremors, syncope and headaches.  Hematological: Does not bruise/bleed easily.       Objective:   Physical Exam Thin amb wf nad  Wt Readings from Last 3 Encounters:  08/31/14 104 lb 6.4 oz (47.356 kg)  08/18/14 104 lb (47.174 kg)  08/04/14 106 lb (48.081 kg)    Vital signs reviewed  HEENT: nl dentition, turbinates, and orophanx. Nl external ear canals without cough reflex   NECK :  without JVD/Nodes/TM/ nl carotid upstrokes bilaterally   LUNGS: no acc muscle use, minimal dullness R Base     CV:  RRR  no s3 or murmur or increase in P2, no edema   ABD:  soft and nontender with nl excursion in the supine position. No bruits or organomegaly, bowel sounds nl  MS:  warm without deformities, calf tenderness, cyanosis or clubbing  SKIN: warm and dry without lesions    NEURO:  alert, approp, no deficits     CXR PA and Lateral:   08/31/2014 :     I personally reviewed images and agree with radiology impression as follows:    Small to moderate right pleural effusion, unchanged and much better than pre-thoracentesis   Labs ordered/ reviewed:    Lab 08/31/14 1545  NA 132*  K 4.5  CL 95*  CO2 29  BUN 22  CREATININE 0.98  GLUCOSE 74      Lab 08/31/14 1545  HGB 12.6  HCT 38.4  WBC 8.4  PLT 323.0  With no eosinophilia    Lab Results  Component Value Date   TSH 1.35 08/31/2014     Lab Results  Component Value Date   PROBNP 141.0* 08/31/2014     Lab Results  Component Value Date   ESRSEDRATE 27* 08/31/2014   Alb up to 4.2         Assessment & Plan:

## 2014-09-01 ENCOUNTER — Encounter: Payer: Self-pay | Admitting: Internal Medicine

## 2014-09-01 ENCOUNTER — Non-Acute Institutional Stay: Payer: Medicare Other | Admitting: Internal Medicine

## 2014-09-01 ENCOUNTER — Telehealth: Payer: Self-pay | Admitting: Internal Medicine

## 2014-09-01 VITALS — BP 122/62 | HR 68 | Temp 97.7°F | Wt 104.0 lb

## 2014-09-01 DIAGNOSIS — J90 Pleural effusion, not elsewhere classified: Secondary | ICD-10-CM

## 2014-09-01 DIAGNOSIS — R0600 Dyspnea, unspecified: Secondary | ICD-10-CM

## 2014-09-01 DIAGNOSIS — J948 Other specified pleural conditions: Secondary | ICD-10-CM

## 2014-09-01 NOTE — Telephone Encounter (Signed)
Spoke with pt and she has a few questions. First she would like to know  What causes the fluid? Will it keep happening? Is there anything to keep it from coming back? MW please advise.

## 2014-09-01 NOTE — Progress Notes (Signed)
Quick Note:  Spoke with pt and notified of results per Dr. Wert. Pt verbalized understanding and denied any questions.  ______ 

## 2014-09-01 NOTE — Progress Notes (Signed)
Patient ID: [name], female   DOB: [birth year], 81 y.o.   MRN: [id number]    [facility]     [facility] Service[facility] (12[facility]     Allergies  Allergen Reactions  . Doxycycline Itching  . Lipitor [Atorvastatin] Other (See Comments)    PAIN IN LEGS    Chief Complaint  Patient presents with  . Medical Management of Chronic Issues    4 week follow up on pleural effusion. Saw Dr. Melvyn Novas 08/31/14, re-peat chest x-ray 08/31/14    HPI:  Pleural effusion, right: Recurred following initial thoracentesis. Saw Dr Melvyn Novas yesterday who spent extensive time reassuring and educating patient who is concerned about why this has happened and wondering if it will continue to recur. Plans for a second thoracentesis next week have been made. Symptoms currently stable  Dyspnea: Ongoing complaint since admission for pneumonia and right pleural effusion. Reports shortness of breath and productive cough, clear-brown sputum. In NAD, converses easily.    Medications: Patient's Medications  New Prescriptions   No medications on file  Previous Medications   AMLODIPINE (NORVASC) 5 MG TABLET    TAKE 1 TABLET DAILY FOR BLOOD PRESSURE.   ASPIRIN 81 MG TABLET    Take 81 mg by mouth daily.     CARVEDILOL (COREG) 3.125 MG TABLET    Take 1 tablet (3.125 mg total) by mouth 2 (two) times daily with a meal.   CHOLECALCIFEROL (VITAMIN D) 1000 UNITS TABLET    Take 1,000 Units by mouth daily. Take one tablet a daily   CRESTOR 10 MG TABLET    TAKE 1 TABLET DAILY TO LOWER CHOLESTEROL.   DOCUSATE SODIUM (COLACE) 100 MG CAPSULE    Take 100 mg by mouth. Take one tablet 3 times daily to prevent constipation   HYDROCODONE-ACETAMINOPHEN (NORCO/VICODIN) 5-325 MG PER TABLET    Take 1 tablet by mouth 3 (three) times daily. Three times a day as needed for pain   KETOROLAC (ACULAR) 0.4 % SOLN    Place 1 drop into the left eye 3 (three) times daily.    MAGNESIUM GLUCONATE 500 (27 MG) MG TABS    Take 1 tablet by mouth.  Take one tablet once daily   MULTIPLE VITAMINS-MINERALS (PRESERVISION AREDS PO)    Take 1 tablet by mouth 2 (two) times daily. Take one tablet twice daily for vision   MULTIVITAMIN (THERAGRAN) PER TABLET    Take 1 tablet by mouth daily.     OFLOXACIN (OCUFLOX) 0.3 % OPHTHALMIC SOLUTION    Place 1 drop into the left eye 3 (three) times daily.    OMEGA-3 FATTY ACIDS (FISH OIL MAXIMUM STRENGTH) 1200 MG CAPS    Take 1 each by mouth. Take one daily   PREDNISOLONE ACETATE (PRED FORTE) 1 % OPHTHALMIC SUSPENSION    Place 1 drop into the left eye 3 (three) times daily.    VITAMIN E 400 UNIT CAPSULE    Take 400 Units by mouth daily.  Modified Medications   No medications on file  Discontinued Medications   No medications on file     Review of Systems  Constitutional: Positive for fatigue. Negative for activity change and appetite change.  HENT: Positive for voice change (chronic dysphonia).        History of goiter.  Eyes:       Cataracts  Respiratory: Positive for cough and shortness of breath.        Right pleural effusion. Pneumonia in May 2016 Produces clear  mucus with cough. Chronic hoarseness.  Cardiovascular: Negative for chest pain, palpitations and leg swelling.  Gastrointestinal:       Mild dysphagia and anorexia.  Endocrine: Negative.   Genitourinary: Positive for flank pain (right, associated with pleural effusion).  Musculoskeletal: Positive for back pain.       Pains radiate from low back down the right leg.   Skin: Negative.   Neurological:       Remote hx CVA.  Psychiatric/Behavioral: The patient is nervous/anxious.     Filed Vitals:   09/01/14 1526  BP: 122/62  Pulse: 68  Temp: 97.7 F (36.5 C)  TempSrc: Oral  Weight: 104 lb (47.174 kg)  SpO2: 99%   Body mass index is 19.02 kg/(m^2).  Physical Exam  Constitutional: She is oriented to person, place, and time. She appears well-developed. No distress.  Thin  HENT:  Head: Normocephalic.  Loss of hearing  Eyes:  Conjunctivae and EOM are normal. Pupils are equal, round, and reactive to light.  Neck: Neck supple. No JVD present. No tracheal deviation present. Thyromegaly present.  Cardiovascular: Normal rate, regular rhythm, normal heart sounds and intact distal pulses.  Exam reveals no gallop and no friction rub.   No murmur heard. Bilateral mild varicose veins.  Pulmonary/Chest: Breath sounds normal. No respiratory distress. She has no wheezes. She has no rales.  Chronic hoarseness. Dullness with percussion of RLL. Absent breath sounds in right lower lobe posteriorly.  Abdominal: Bowel sounds are normal. She exhibits no distension and no mass. There is no tenderness.  Musculoskeletal: Normal range of motion. She exhibits no edema or tenderness.  Bony sacrum and coccyx prominent  Lymphadenopathy:    She has no cervical adenopathy.  Neurological: She is alert and oriented to person, place, and time. She has normal reflexes. No cranial nerve deficit. She exhibits normal muscle tone. Coordination normal.  Skin: No rash noted. No erythema. No pallor.  Psychiatric: She has a normal mood and affect. Her behavior is normal. Judgment and thought content normal.     Labs reviewed: Appointment on 08/31/2014  Component Date Value Ref Range Status  . Sodium 08/31/2014 132* 135 - 145 mEq/L Final  . Potassium 08/31/2014 4.5  3.5 - 5.1 mEq/L Final  . Chloride 08/31/2014 95* 96 - 112 mEq/L Final  . CO2 08/31/2014 29  19 - 32 mEq/L Final  . Glucose, Bld 08/31/2014 74  70 - 99 mg/dL Final  . BUN 08/31/2014 22  6 - 23 mg/dL Final  . Creatinine, Ser 08/31/2014 0.98  0.40 - 1.20 mg/dL Final  . Calcium 08/31/2014 9.7  8.4 - 10.5 mg/dL Final  . GFR 08/31/2014 56.93* >60.00 mL/min Final  . Total Bilirubin 08/31/2014 0.5  0.2 - 1.2 mg/dL Final  . Bilirubin, Direct 08/31/2014 0.1  0.0 - 0.3 mg/dL Final  . Alkaline Phosphatase 08/31/2014 64  39 - 117 U/L Final  . AST 08/31/2014 21  0 - 37 U/L Final  . ALT 08/31/2014  11  0 - 35 U/L Final  . Total Protein 08/31/2014 7.8  6.0 - 8.3 g/dL Final  . Albumin 08/31/2014 4.2  3.5 - 5.2 g/dL Final  . WBC 08/31/2014 8.4  4.0 - 10.5 K/uL Final  . RBC 08/31/2014 4.47  3.87 - 5.11 Mil/uL Final  . Hemoglobin 08/31/2014 12.6  12.0 - 15.0 g/dL Final  . HCT 08/31/2014 38.4  36.0 - 46.0 % Final  . MCV 08/31/2014 86.0  78.0 - 100.0 fl Final  . MCHC 08/31/2014  32.9  30.0 - 36.0 g/dL Final  . RDW 08/31/2014 15.4  11.5 - 15.5 % Final  . Platelets 08/31/2014 323.0  150.0 - 400.0 K/uL Final  . Neutrophils Relative % 08/31/2014 43.9  43.0 - 77.0 % Final  . Lymphocytes Relative 08/31/2014 45.0  12.0 - 46.0 % Final  . Monocytes Relative 08/31/2014 8.3  3.0 - 12.0 % Final  . Eosinophils Relative 08/31/2014 2.2  0.0 - 5.0 % Final  . Basophils Relative 08/31/2014 0.6  0.0 - 3.0 % Final  . Neutro Abs 08/31/2014 3.7  1.4 - 7.7 K/uL Final  . Lymphs Abs 08/31/2014 3.8  0.7 - 4.0 K/uL Final  . Monocytes Absolute 08/31/2014 0.7  0.1 - 1.0 K/uL Final  . Eosinophils Absolute 08/31/2014 0.2  0.0 - 0.7 K/uL Final  . Basophils Absolute 08/31/2014 0.1  0.0 - 0.1 K/uL Final  . Pro B Natriuretic peptide (BNP) 08/31/2014 141.0* 0.0 - 100.0 pg/mL Final  . TSH 08/31/2014 1.35  0.35 - 4.50 uIU/mL Final  . Sed Rate 08/31/2014 27* 0 - 22 mm/hr Final  Abstract on 08/24/2014  Component Date Value Ref Range Status  . Hemoglobin 08/23/2014 12.1  12.0 - 16.0 g/dL Final  . HCT 08/23/2014 37  36 - 46 % Final  . Platelets 08/23/2014 303  150 - 399 K/L Final  . WBC 08/23/2014 7.1   Final  . Glucose 08/23/2014 97   Final  . BUN 08/23/2014 15  4 - 21 mg/dL Final  . Creatinine 08/23/2014 0.9  0.5 - 1.1 mg/dL Final  . Potassium 08/23/2014 4.3  3.4 - 5.3 mmol/L Final  . Sodium 08/23/2014 139  137 - 147 mmol/L Final  Admission on 07/24/2014, Discharged on 07/28/2014  Component Date Value Ref Range Status  . Sodium 07/24/2014 135  135 - 145 mmol/L Final  . Potassium 07/24/2014 3.9  3.5 - 5.1 mmol/L Final    . Chloride 07/24/2014 98* 101 - 111 mmol/L Final  . CO2 07/24/2014 26  22 - 32 mmol/L Final  . Glucose, Bld 07/24/2014 124* 65 - 99 mg/dL Final  . BUN 07/24/2014 20  6 - 20 mg/dL Final  . Creatinine, Ser 07/24/2014 0.93  0.44 - 1.00 mg/dL Final  . Calcium 07/24/2014 9.4  8.9 - 10.3 mg/dL Final  . GFR calc non Af Amer 07/24/2014 54* >60 mL/min Final  . GFR calc Af Amer 07/24/2014 >60  >60 mL/min Final   Comment: (NOTE) The eGFR has been calculated using the CKD EPI equation. This calculation has not been validated in all clinical situations. eGFR's persistently <60 mL/min signify possible Chronic Kidney Disease.   . Anion gap 07/24/2014 11  5 - 15 Final  . WBC 07/24/2014 10.6* 4.0 - 10.5 K/uL Final  . RBC 07/24/2014 4.46  3.87 - 5.11 MIL/uL Final  . Hemoglobin 07/24/2014 12.8  12.0 - 15.0 g/dL Final  . HCT 07/24/2014 38.5  36.0 - 46.0 % Final  . MCV 07/24/2014 86.3  78.0 - 100.0 fL Final  . MCH 07/24/2014 28.7  26.0 - 34.0 pg Final  . MCHC 07/24/2014 33.2  30.0 - 36.0 g/dL Final  . RDW 07/24/2014 14.7  11.5 - 15.5 % Final  . Platelets 07/24/2014 281  150 - 400 K/uL Final  . Neutrophils Relative % 07/24/2014 74  43 - 77 % Final  . Neutro Abs 07/24/2014 7.7  1.7 - 7.7 K/uL Final  . Lymphocytes Relative 07/24/2014 17  12 - 46 % Final  .  Lymphs Abs 07/24/2014 1.8  0.7 - 4.0 K/uL Final  . Monocytes Relative 07/24/2014 8  3 - 12 % Final  . Monocytes Absolute 07/24/2014 0.9  0.1 - 1.0 K/uL Final  . Eosinophils Relative 07/24/2014 1  0 - 5 % Final  . Eosinophils Absolute 07/24/2014 0.2  0.0 - 0.7 K/uL Final  . Basophils Relative 07/24/2014 0  0 - 1 % Final  . Basophils Absolute 07/24/2014 0.0  0.0 - 0.1 K/uL Final  . Color, Urine 07/24/2014 YELLOW  YELLOW Final  . APPearance 07/24/2014 CLEAR  CLEAR Final  . Specific Gravity, Urine 07/24/2014 1.011  1.005 - 1.030 Final  . pH 07/24/2014 7.5  5.0 - 8.0 Final  . Glucose, UA 07/24/2014 NEGATIVE  NEGATIVE mg/dL Final  . Hgb urine dipstick  07/24/2014 NEGATIVE  NEGATIVE Final  . Bilirubin Urine 07/24/2014 NEGATIVE  NEGATIVE Final  . Ketones, ur 07/24/2014 15* NEGATIVE mg/dL Final  . Protein, ur 07/24/2014 NEGATIVE  NEGATIVE mg/dL Final  . Urobilinogen, UA 07/24/2014 0.2  0.0 - 1.0 mg/dL Final  . Nitrite 07/24/2014 NEGATIVE  NEGATIVE Final  . Leukocytes, UA 07/24/2014 NEGATIVE  NEGATIVE Final   MICROSCOPIC NOT DONE ON URINES WITH NEGATIVE PROTEIN, BLOOD, LEUKOCYTES, NITRITE, OR GLUCOSE <1000 mg/dL.  . Troponin I 07/25/2014 <0.03  <0.031 ng/mL Final   Comment:        NO INDICATION OF MYOCARDIAL INJURY.   . Troponin I 07/25/2014 <0.03  <0.031 ng/mL Final   Comment:        NO INDICATION OF MYOCARDIAL INJURY.   . Troponin I 07/25/2014 <0.03  <0.031 ng/mL Final   Comment:        NO INDICATION OF MYOCARDIAL INJURY.   . Hgb A1c MFr Bld 07/25/2014 5.7* 4.8 - 5.6 % Final   Comment: (NOTE)         Pre-diabetes: 5.7 - 6.4         Diabetes: >6.4         Glycemic control for adults with diabetes: <7.0   . Mean Plasma Glucose 07/25/2014 117   Final   Comment: (NOTE) Performed At: [facility] [place], [place] [id number] Lindon Romp MD PI:[id number]   . WBC 07/25/2014 9.1  4.0 - 10.5 K/uL Final  . RBC 07/25/2014 3.87  3.87 - 5.11 MIL/uL Final  . Hemoglobin 07/25/2014 11.0* 12.0 - 15.0 g/dL Final  . HCT 07/25/2014 33.4* 36.0 - 46.0 % Final  . MCV 07/25/2014 86.3  78.0 - 100.0 fL Final  . MCH 07/25/2014 28.4  26.0 - 34.0 pg Final  . MCHC 07/25/2014 32.9  30.0 - 36.0 g/dL Final  . RDW 07/25/2014 14.8  11.5 - 15.5 % Final  . Platelets 07/25/2014 267  150 - 400 K/uL Final  . Sodium 07/25/2014 137  135 - 145 mmol/L Final  . Potassium 07/25/2014 3.9  3.5 - 5.1 mmol/L Final  . Chloride 07/25/2014 102  101 - 111 mmol/L Final  . CO2 07/25/2014 26  22 - 32 mmol/L Final  . Glucose, Bld 07/25/2014 101* 65 - 99 mg/dL Final  . BUN 07/25/2014 15  6 - 20 mg/dL Final  . Creatinine, Ser 07/25/2014 0.78  0.44 -  1.00 mg/dL Final  . Calcium 07/25/2014 8.6* 8.9 - 10.3 mg/dL Final  . Total Protein 07/25/2014 6.2* 6.5 - 8.1 g/dL Final  . Albumin 07/25/2014 3.1* 3.5 - 5.0 g/dL Final  . AST 07/25/2014 19  15 - 41 U/L Final  .  ALT 07/25/2014 9* 14 - 54 U/L Final  . Alkaline Phosphatase 07/25/2014 60  38 - 126 U/L Final  . Total Bilirubin 07/25/2014 0.6  0.3 - 1.2 mg/dL Final  . GFR calc non Af Amer 07/25/2014 >60  >60 mL/min Final  . GFR calc Af Amer 07/25/2014 >60  >60 mL/min Final   Comment: (NOTE) The eGFR has been calculated using the CKD EPI equation. This calculation has not been validated in all clinical situations. eGFR's persistently <60 mL/min signify possible Chronic Kidney Disease.   . Anion gap 07/25/2014 9  5 - 15 Final  . LDH 07/25/2014 141  98 - 192 U/L Final  . Total Protein 07/25/2014 6.4* 6.5 - 8.1 g/dL Final  . Albumin 07/25/2014 3.2* 3.5 - 5.0 g/dL Final  . Albumin, Fluid 07/25/2014 2.6   Final   Comment: (NOTE) No normal range established for this test Results should be evaluated in conjunction with serum values Performed at [facility]   . Fluid Type-FALB 07/25/2014 PLEURAL   Final  . Amylase, Pleural Fluid 07/25/2014 20   Final   Comment: (NOTE) Reference range: Values are considered abnormal if they are greater than or equal to two times a simultaneously analyzed serum value. Performed at [facility]   . Fluid Type-FCT 07/25/2014 PLEURAL   Final  . Color, Fluid 07/25/2014 YELLOW* YELLOW Final  . Appearance, Fluid 07/25/2014 CLOUDY* CLEAR Final  . WBC, Fluid 07/25/2014 953  0 - 1000 cu mm Final  . Neutrophil Count, Fluid 07/25/2014 1  0 - 25 % Final  . Lymphs, Fluid 07/25/2014 16   Final  . Monocyte-Macrophage-Serous Fluid 07/25/2014 40* 50 - 90 % Final  . Eos, Fluid 07/25/2014 43   Final  . Other Cells, Fluid 07/25/2014 CORRELATE WITH CYTOLOGY.   Final  . Glucose, Fluid 07/25/2014 96   Final   Comment: (NOTE) No normal range established for this  test Results should be evaluated in conjunction with serum values Performed at [facility]   . Fluid Type-FGLU 07/25/2014 PLEURAL   Final  . LD, Fluid 07/25/2014 104* 3 - 23 U/L Final   Comment: (NOTE) Results should be evaluated in conjunction with serum values Performed at [facility]   . Fluid Type-FLDH 07/25/2014 PLEURAL   Final  . pH, Fluid Type 07/25/2014 PLEURAL   Final  . pH, Fluid 07/25/2014 8.00   Final   Performed at [facility]     Assessment/Plan 1. Pleural effusion, right Dr Melvyn Novas, Pulmonology following Thoracentesis scheduled for next week F/u CXR scheduled for 09/15/14  2. Dyspnea Stable

## 2014-09-01 NOTE — Telephone Encounter (Signed)
It was prob a pna with pleurisy and looking over all the records and xrays I bet it won't come back again p tap it off tomorrow but need to set up a final f/u ov in 2 weeks with cxr to address this issue/ close the loop

## 2014-09-01 NOTE — Assessment & Plan Note (Signed)
I had an extended discussion with the patient reviewing all relevant studies completed to date and  lasting  37 m re the following issues  1) although not a clear cut CAP hx, most likely this was/ is an uncomplicated R  parapneumonic process already approp treated   2) the eosinophilia is completely non specific finding but generally points to a benign cause - can be seen with traumatic effusions but she denies hx of such  3) always concern about aspiration pna rather than CAP at this age with empyema but the fluid analysis with nl glucose and wbc differtential did not support that  4) issue now is hwo to deal with the residual effusion - generally pts who can lie flat comfortably do not have a significant "space/mass effect" problem  5) these type of effusions tend to resolve with minimal residual scarring and at age 52 she probably won't notice the loss of lung fxn assoc with it though note there has been sign recurrence since the last post tap film   6) therefore rec one more tap to be sure re dx and nothing further unless much greater symptoms or plain cxr progression   7) Discussed in detail all the  indications, usual  risks and alternatives  relative to the benefits with patient who agrees to proceed with repeat tap dry

## 2014-09-01 NOTE — Telephone Encounter (Signed)
Pt called back to discuss cxr results further  Per MW, fluid is probably pna with pleurisy and OV in 2 weeks needs to be set up with cxr at this time   Pt informed of the above rec of MW. Pt was scheduled for a 2 week OV with MW to have cxr and discuss results. Nothing further is needed

## 2014-09-01 NOTE — Telephone Encounter (Signed)
lmomtcb x1 

## 2014-09-07 ENCOUNTER — Ambulatory Visit (HOSPITAL_COMMUNITY)
Admission: RE | Admit: 2014-09-07 | Discharge: 2014-09-07 | Disposition: A | Discharge status: Home / Self Care | Payer: Medicare Other | Source: Ambulatory Visit | Attending: Radiology | Admitting: Radiology

## 2014-09-07 ENCOUNTER — Ambulatory Visit (HOSPITAL_COMMUNITY)
Admission: RE | Admit: 2014-09-07 | Discharge: 2014-09-07 | Disposition: A | Discharge status: Home / Self Care | Payer: Medicare Other | Source: Ambulatory Visit | Attending: Internal Medicine | Admitting: Internal Medicine

## 2014-09-07 DIAGNOSIS — R0600 Dyspnea, unspecified: Secondary | ICD-10-CM | POA: Diagnosis not present

## 2014-09-07 DIAGNOSIS — I272 Other secondary pulmonary hypertension: Secondary | ICD-10-CM | POA: Diagnosis not present

## 2014-09-07 DIAGNOSIS — J90 Pleural effusion, not elsewhere classified: Secondary | ICD-10-CM

## 2014-09-07 DIAGNOSIS — J948 Other specified pleural conditions: Secondary | ICD-10-CM | POA: Diagnosis not present

## 2014-09-07 DIAGNOSIS — R091 Pleurisy: Secondary | ICD-10-CM | POA: Diagnosis not present

## 2014-09-07 DIAGNOSIS — Z9889 Other specified postprocedural states: Secondary | ICD-10-CM

## 2014-09-07 LAB — BODY FLUID CELL COUNT WITH DIFFERENTIAL
Eos, Fluid: 8 %
Lymphs, Fluid: 36 %
Monocyte-Macrophage-Serous Fluid: 55 % (ref 50–90)
NEUTROPHIL FLUID: 1 % (ref 0–25)
Total Nucleated Cell Count, Fluid: 829 cu mm (ref 0–1000)

## 2014-09-07 LAB — PROTEIN, BODY FLUID: Total protein, fluid: 4.2 g/dL

## 2014-09-07 LAB — GRAM STAIN

## 2014-09-07 LAB — LACTATE DEHYDROGENASE, PLEURAL OR PERITONEAL FLUID: LD FL: 93 U/L — AB (ref 3–23)

## 2014-09-07 LAB — GLUCOSE, SEROUS FLUID: Glucose, Fluid: 97 mg/dL

## 2014-09-07 NOTE — Procedures (Signed)
US guided diagnostic/therapeutic right thoracentesis performed yielding 520 cc's yellow fluid. F/u CXR pending. The fluid was sent to the lab for preordered studies. No immediate complications.

## 2014-09-09 ENCOUNTER — Other Ambulatory Visit: Payer: Self-pay | Admitting: Internal Medicine

## 2014-09-09 DIAGNOSIS — H2511 Age-related nuclear cataract, right eye: Secondary | ICD-10-CM | POA: Diagnosis not present

## 2014-09-12 LAB — CULTURE, BODY FLUID-BOTTLE: CULTURE: NO GROWTH

## 2014-09-12 LAB — CULTURE, BODY FLUID W GRAM STAIN -BOTTLE

## 2014-09-13 ENCOUNTER — Telehealth: Payer: Self-pay | Admitting: Internal Medicine

## 2014-09-13 ENCOUNTER — Ambulatory Visit (HOSPITAL_COMMUNITY): Payer: Medicare Other

## 2014-09-13 DIAGNOSIS — Z79891 Long term (current) use of opiate analgesic: Secondary | ICD-10-CM | POA: Diagnosis not present

## 2014-09-13 DIAGNOSIS — M47812 Spondylosis without myelopathy or radiculopathy, cervical region: Secondary | ICD-10-CM | POA: Diagnosis not present

## 2014-09-13 DIAGNOSIS — G894 Chronic pain syndrome: Secondary | ICD-10-CM | POA: Diagnosis not present

## 2014-09-13 DIAGNOSIS — M47816 Spondylosis without myelopathy or radiculopathy, lumbar region: Secondary | ICD-10-CM | POA: Diagnosis not present

## 2014-09-13 NOTE — Telephone Encounter (Signed)
Pt would like to know biopsy results, she will be going to dinner in an hour.  [phone]

## 2014-09-13 NOTE — Telephone Encounter (Signed)
Notes Recorded by Nyoka Cowden, MD on 09/11/2014 at 7:22 PM Call patient : Study is unremarkable, c/w benign self limited pleural rxn to previous infection so needs f/u ov in 2week for final f/u hopefully  ------------------  Called, spoke with pt.  Discussed above per Dr. Sherene Sires.  She verbalized understanding and confirmed pending appt with Dr. Sherene Sires.

## 2014-09-13 NOTE — Progress Notes (Signed)
Quick Note:  Pt aware of results. See 09/13/14 phone msg for additional information. ______

## 2014-09-13 NOTE — Progress Notes (Signed)
Quick Note:  LMTCB ______ 

## 2014-09-15 ENCOUNTER — Encounter: Payer: Self-pay | Admitting: Internal Medicine

## 2014-09-15 ENCOUNTER — Ambulatory Visit (INDEPENDENT_AMBULATORY_CARE_PROVIDER_SITE_OTHER)
Admission: RE | Admit: 2014-09-15 | Discharge: 2014-09-15 | Disposition: A | Discharge status: Home / Self Care | Payer: Medicare Other | Source: Ambulatory Visit | Attending: Internal Medicine | Admitting: Internal Medicine

## 2014-09-15 ENCOUNTER — Ambulatory Visit (INDEPENDENT_AMBULATORY_CARE_PROVIDER_SITE_OTHER): Payer: Medicare Other | Admitting: Internal Medicine

## 2014-09-15 VITALS — BP 102/68 | HR 72 | Ht 62.0 in | Wt 104.0 lb

## 2014-09-15 DIAGNOSIS — R058 Other specified cough: Secondary | ICD-10-CM

## 2014-09-15 DIAGNOSIS — J948 Other specified pleural conditions: Secondary | ICD-10-CM

## 2014-09-15 DIAGNOSIS — J90 Pleural effusion, not elsewhere classified: Secondary | ICD-10-CM | POA: Diagnosis not present

## 2014-09-15 DIAGNOSIS — R05 Cough: Secondary | ICD-10-CM | POA: Diagnosis not present

## 2014-09-15 LAB — OTHER BODY FLUID CHEMISTRY: Miscellaneous Test Results: 19

## 2014-09-15 MED ORDER — PANTOPRAZOLE SODIUM 40 MG PO TBEC: 40.0000 mg | DELAYED_RELEASE_TABLET | Freq: Every day | ORAL | Status: DC

## 2014-09-15 MED ORDER — PREDNISONE 10 MG PO TABS: ORAL_TABLET | ORAL | Status: DC

## 2014-09-15 MED ORDER — FAMOTIDINE 20 MG PO TABS: ORAL_TABLET | ORAL | Status: DC

## 2014-09-15 NOTE — Progress Notes (Signed)
Subjective:    Patient ID: [name], female    DOB: [birth year],    MRN: [id number]    Brief patient profile:  20 yowf never smoker  S/p admit with ?cap/parapneumonic effusion     Admit date: 07/24/2014 Discharge date: 07/28/2014   Recommendations for Outpatient Follow-Up:   1. Please follow-up on pleural fluid cytology. Unfortunately, pleural fluid cultures were not sent. 2. Recommend repeat chest x-ray 4-6 weeks to exclude any underlying abnormalities.  Discharge Diagnosis:   Principal Problem:   CAP (community acquired pneumonia) Active Problems:   Hyperglycemia   Pleural effusion, right   Pneumonitis   Tricuspid valve regurgitation, mild-moderate   Mild-moderate pulmonary hypertension   Discharge disposition: Home.   Discharge Condition: Improved.  Diet recommendation: Low sodium, heart healthy.   History of Present Illness:   [name] is an 81 y.o. female with a PMH of hypertension, hyperlipidemia and cerebrovascular disease who was admitted 07/24/14 with sore throat/chills/fever/ R pleuritic cp with new R effusion   Hospital Course by Problem:   Principal Problem:  CAP (community acquired pneumonia) / right pleural effusion / pneumonitis - Treated with azithromycin and Rocephin with clinical improvement, will discharge on an additional 4 days of treatment with Ceftin. - CT of the chest ordered on admission showed right sided pneumonitis and a moderate-large pleural effusion. - Status post thoracentesis. Follow-up pleural fluid cytology, unfortunately cultures not sent. - Repeat CXR in 4-6 weeks to exclude any underlying pathology. - 2-D echocardiogram showed normal ventricular function.  Active Problems:  Chest pain - Troponins negative 3. - Continue aspirin and Crestor. Carvedilol started.   Hyperglycemia - Hemoglobin A1c 5.7%, corresponding to a mean plasma glucose of 117 consistent with prediabetes. - The  patient was counseled about this finding     R thoracentesis Jul 24 2014 x 620cc x exudate eos 43% x neg cyt    08/31/2014 1st [facility] office visit/ Kortne All   Chief Complaint  Patient presents with  . Pulmonary Consult    Referred by Dr. Murray Hodgkins for eval of pleural effusion. Pt recently hospitalized for PNA and pleural effusion- had thora done 07/25/14. She c/o feeling SOB "pretty much all the time". She has occ cough- prod with clear sputum.    breathing better but not back to baseline as still sense she can't get a deep breath   Ok lying flat/ sleeps fine/ cough is daytime  Note never documented temp above 99.9 during admit and none since d/c reported   - Repeat on R 09/07/2014 >>>  520 cc x exudate /eos 11% x neg cyt      09/15/2014 f/u ov/Shamyra Farias re: f/u effusion  Chief Complaint  Patient presents with  . Follow-up    Pt states her breathing is no better since her last visit. No new co's today.    able to lie back in bed at usual angle, Not limited by breathing from desired activities    sense of  pnds is day > noct with urge to clear throat disprorportionate to mucus production which is minimal.   No obvious day to day or daytime variability or assoc   cp or chest tightness, subjective wheeze or overt sinus or hb symptoms. No unusual exp hx or h/o childhood pna/ asthma or knowledge of premature birth.  Sleeping ok without nocturnal  or early am exacerbation  of respiratory  c/o's or need for noct saba. Also denies any obvious fluctuation of symptoms with weather  or environmental changes or other aggravating or alleviating factors except as outlined above   Current Medications, Allergies, Complete Past Medical History, Past Surgical History, Family History, and Social History were reviewed in [employer] record.  ROS  The following are not active complaints unless bolded sore throat, dysphagia, dental problems, itching, sneezing,  nasal congestion or  excess/ purulent secretions, ear ache,   fever, chills, sweats, unintended wt loss, classically pleuritic or exertional cp, hemoptysis,  orthopnea pnd or leg swelling, presyncope, palpitations, abdominal pain, anorexia, nausea, vomiting, diarrhea  or change in bowel or bladder habits, change in stools or urine, dysuria,hematuria,  rash, arthralgias, visual complaints, headache, numbness, weakness or ataxia or problems with walking or coordination,  change in mood/affect or memory.                Objective:   Physical Exam  Thin amb wf nad  09/15/14              104  Wt Readings from Last 3 Encounters:  08/31/14 104 lb 6.4 oz (47.356 kg)  08/18/14 104 lb (47.174 kg)  08/04/14 106 lb (48.081 kg)    Vital signs reviewed  HEENT: nl dentition, turbinates, and orophanx. Nl external ear canals without cough reflex   NECK :  without JVD/Nodes/TM/ nl carotid upstrokes bilaterally   LUNGS: no acc muscle use, minimal dullness R Base     CV:  RRR  no s3 or murmur or increase in P2, no edema   ABD:  soft and nontender with nl excursion in the supine position. No bruits or organomegaly, bowel sounds nl  MS:  warm without deformities, calf tenderness, cyanosis or clubbing  SKIN: warm and dry without lesions    NEURO:  alert, approp, no deficits       Labs ordered/ reviewed:    Lab 08/31/14 1545  NA 132*  K 4.5  CL 95*  CO2 29  BUN 22  CREATININE 0.98  GLUCOSE 74      Lab 08/31/14 1545  HGB 12.6  HCT 38.4  WBC 8.4  PLT 323.0  With no eosinophilia    Lab Results  Component Value Date   TSH 1.35 08/31/2014     Lab Results  Component Value Date   PROBNP 141.0* 08/31/2014     Lab Results  Component Value Date   ESRSEDRATE 27* 08/31/2014   Alb up to 4.2       CXR PA and Lateral:   09/15/2014 :     I personally reviewed images and agree with radiology impression as follows:   1. Right lower lobe subsegmental atelectasis and or mild infiltrate. 2. Small right  pleural effusion. Pleural effusion is increased slightly from prior exam.   Assessment & Plan:

## 2014-09-15 NOTE — Patient Instructions (Addendum)
Prednisone 10 mg take  4 each am x 2 days,   2 each am x 2 days,  1 each am x 2 days and stop   Pantoprazole (protonix) 40 mg   Take  30-60 min before first meal of the day and Pepcid (famotidine)  20 mg one @  bedtime until return to office - this is the best way to tell whether stomach acid is contributing to your problem.     GERD (REFLUX)  is an extremely common cause of respiratory symptoms just like yours , many times with no obvious heartburn at all.    It can be treated with medication, but also with lifestyle changes including elevation of the head of your bed (ideally with 6 inch  bed blocks),  Smoking cessation, avoidance of late meals, excessive alcohol, and avoid fatty foods, chocolate, peppermint, colas, red wine, and acidic juices such as orange juice.  NO MINT OR MENTHOL PRODUCTS SO NO COUGH DROPS  USE SUGARLESS CANDY INSTEAD (Jolley ranchers or Stover's or Life Savers) or even ice chips will also do - the key is to swallow to prevent all throat clearing. NO OIL BASED VITAMINS - use powdered substitutes.    Please schedule a follow up office visit in 6 weeks, call sooner if needed with cxr on return

## 2014-09-17 ENCOUNTER — Encounter: Payer: Self-pay | Admitting: Internal Medicine

## 2014-09-17 DIAGNOSIS — R05 Cough: Secondary | ICD-10-CM | POA: Insufficient documentation

## 2014-09-17 DIAGNOSIS — R058 Other specified cough: Secondary | ICD-10-CM | POA: Insufficient documentation

## 2014-09-17 NOTE — Assessment & Plan Note (Signed)
The most common causes of chronic cough in immunocompetent adults include the following: upper airway cough syndrome (UACS), previously referred to as postnasal drip syndrome (PNDS), which is caused by variety of rhinosinus conditions; (2) asthma; (3) GERD; (4) chronic bronchitis from cigarette smoking or other inhaled environmental irritants; (5) nonasthmatic eosinophilic bronchitis; and (6) bronchiectasis.   These conditions, singly or in combination, have accounted for up to 94% of the causes of chronic cough in prospective studies.   Other conditions have constituted no >6% of the causes in prospective studies These have included bronchogenic carcinoma, chronic interstitial pneumonia, sarcoidosis, left ventricular failure, ACEI-induced cough, and aspiration from a condition associated with pharyngeal dysfunction.    Chronic cough is often simultaneously caused by more than one condition. A single cause has been found from 38 to 82% of the time, multiple causes from 18 to 62%. Multiply caused cough has been the result of three diseases up to 42% of the time.       Based on hx and exam, this is most likely:  Classic Upper airway cough syndrome, so named because it's frequently impossible to sort out how much is  CR/sinusitis with freq throat clearing (which can be related to primary GERD)   vs  causing  secondary (" extra esophageal")  GERD from wide swings in gastric pressure that occur with throat clearing, often  promoting self use of mint and menthol lozenges that reduce the lower esophageal sphincter tone and exacerbate the problem further in a cyclical fashion.   These are the same pts (now being labeled as having "irritable larynx syndrome" by some cough centers) who not infrequently have a history of having failed to tolerate ace inhibitors,  dry powder inhalers or biphosphonates or report having atypical reflux symptoms that don't respond to standard doses of PPI , and are easily confused as  having aecopd or asthma flares by even experienced allergists/ pulmonologists.   The first step is to maximize acid suppression  then regroup if the cough persists p one short course of prednisone   See instructions for specific recommendations which were reviewed directly with the patient who was given a copy with highlighter outlining the key components.

## 2014-09-17 NOTE — Assessment & Plan Note (Addendum)
-   R thoracentesis Jul 24 2014 x 620cc x exudate eos 43% x neg cyt  - Repeat on R 09/07/2014 >>>  520 cc x exudate /eos 11% x neg cyt    I had an extended discussion with the patient reviewing all relevant studies completed to date and  lasting 15 to 20 minutes of a 25 minute visit    There is really only a very small residual effusion with less eosinophils and minimally nagging upper airway pattern cough so no more taps needed and certainly not a good candidate for pleurex much less VATS but still feel this is just an unusual late parapneumonic process   Each maintenance medication was reviewed in detail including most importantly the difference between maintenance and prns and under what circumstances the prns are to be triggered using an action plan format that is not reflected in the computer generated alphabetically organized AVS.    Please see instructions for details which were reviewed in writing and the patient given a copy highlighting the part that I personally wrote and discussed at today's ov.

## 2014-09-19 DIAGNOSIS — R509 Fever, unspecified: Secondary | ICD-10-CM | POA: Insufficient documentation

## 2014-10-03 ENCOUNTER — Other Ambulatory Visit: Payer: Self-pay | Admitting: Internal Medicine

## 2014-10-26 ENCOUNTER — Encounter: Payer: Self-pay | Admitting: Internal Medicine

## 2014-10-26 ENCOUNTER — Ambulatory Visit (INDEPENDENT_AMBULATORY_CARE_PROVIDER_SITE_OTHER)
Admission: RE | Admit: 2014-10-26 | Discharge: 2014-10-26 | Disposition: A | Discharge status: Home / Self Care | Payer: Medicare Other | Source: Ambulatory Visit | Attending: Internal Medicine | Admitting: Internal Medicine

## 2014-10-26 ENCOUNTER — Ambulatory Visit (INDEPENDENT_AMBULATORY_CARE_PROVIDER_SITE_OTHER): Payer: Medicare Other | Admitting: Internal Medicine

## 2014-10-26 ENCOUNTER — Telehealth: Payer: Self-pay | Admitting: Internal Medicine

## 2014-10-26 ENCOUNTER — Other Ambulatory Visit (INDEPENDENT_AMBULATORY_CARE_PROVIDER_SITE_OTHER): Payer: Medicare Other

## 2014-10-26 VITALS — BP 132/66 | HR 65 | Ht 62.0 in | Wt 104.0 lb

## 2014-10-26 DIAGNOSIS — J90 Pleural effusion, not elsewhere classified: Secondary | ICD-10-CM | POA: Diagnosis not present

## 2014-10-26 DIAGNOSIS — R05 Cough: Secondary | ICD-10-CM

## 2014-10-26 DIAGNOSIS — R0600 Dyspnea, unspecified: Secondary | ICD-10-CM | POA: Diagnosis not present

## 2014-10-26 DIAGNOSIS — J948 Other specified pleural conditions: Secondary | ICD-10-CM | POA: Diagnosis not present

## 2014-10-26 DIAGNOSIS — R058 Other specified cough: Secondary | ICD-10-CM

## 2014-10-26 DIAGNOSIS — T887XXA Unspecified adverse effect of drug or medicament, initial encounter: Secondary | ICD-10-CM

## 2014-10-26 DIAGNOSIS — T50905A Adverse effect of unspecified drugs, medicaments and biological substances, initial encounter: Secondary | ICD-10-CM

## 2014-10-26 LAB — CBC WITH DIFFERENTIAL/PLATELET
BASOS PCT: 0.3 % (ref 0.0–3.0)
Basophils Absolute: 0 10*3/uL (ref 0.0–0.1)
EOS PCT: 1.4 % (ref 0.0–5.0)
Eosinophils Absolute: 0.1 10*3/uL (ref 0.0–0.7)
HEMATOCRIT: 38.7 % (ref 36.0–46.0)
HEMOGLOBIN: 12.9 g/dL (ref 12.0–15.0)
LYMPHS PCT: 28.8 % (ref 12.0–46.0)
Lymphs Abs: 2.7 10*3/uL (ref 0.7–4.0)
MCHC: 33.3 g/dL (ref 30.0–36.0)
MCV: 85.9 fl (ref 78.0–100.0)
Monocytes Absolute: 0.6 10*3/uL (ref 0.1–1.0)
Monocytes Relative: 6.8 % (ref 3.0–12.0)
Neutro Abs: 5.9 10*3/uL (ref 1.4–7.7)
Neutrophils Relative %: 62.7 % (ref 43.0–77.0)
Platelets: 293 10*3/uL (ref 150.0–400.0)
RBC: 4.5 Mil/uL (ref 3.87–5.11)
RDW: 16.2 % — AB (ref 11.5–15.5)
WBC: 9.4 10*3/uL (ref 4.0–10.5)

## 2014-10-26 MED ORDER — FAMOTIDINE 20 MG PO TABS: ORAL_TABLET | ORAL | Status: DC

## 2014-10-26 MED ORDER — PREDNISONE 10 MG PO TABS: ORAL_TABLET | ORAL | Status: DC

## 2014-10-26 MED ORDER — CETIRIZINE HCL 10 MG PO TABS: 10.0000 mg | ORAL_TABLET | Freq: Every day | ORAL | Status: DC | PRN

## 2014-10-26 NOTE — Progress Notes (Signed)
Subjective:    Patient ID: [name], female    DOB: 1925-02-11,    MRN: [id number]    Brief patient profile:  45 yowf never smoker  S/p admit with ?cap/parapneumonic effusion     Admit date: 07/24/2014 Discharge date: 07/28/2014  Discharge Diagnosis:   Principal Problem:   CAP (community acquired pneumonia) Active Problems:   Hyperglycemia   Pleural effusion, right   Pneumonitis   Tricuspid valve regurgitation, mild-moderate   Mild-moderate pulmonary hypertension   Discharge disposition: Home.   Discharge Condition: Improved.  Diet recommendation: Low sodium, heart healthy.   History of Present Illness:   [name] is an 81 y.o. female with a PMH of hypertension, hyperlipidemia and cerebrovascular disease who was admitted 07/24/14 with sore throat/chills/fever/ R pleuritic cp with new R effusion   Hospital Course by Problem:   Principal Problem:  CAP (community acquired pneumonia) / right pleural effusion / pneumonitis - Treated with azithromycin and Rocephin with clinical improvement, will discharge on an additional 4 days of treatment with Ceftin. - CT of the chest ordered on admission showed right sided pneumonitis and a moderate-large pleural effusion. - Status post thoracentesis. Follow-up pleural fluid cytology, unfortunately cultures not sent. - Repeat CXR in 4-6 weeks to exclude any underlying pathology. - 2-D echocardiogram showed normal ventricular function.  Active Problems:  Chest pain - Troponins negative 3. - Continue aspirin and Crestor. Carvedilol started.   Hyperglycemia - Hemoglobin A1c 5.7%, corresponding to a mean plasma glucose of 117 consistent with prediabetes. - The patient was counseled about this finding     R thoracentesis Jul 24 2014 x 620cc x exudate eos 43% x neg cyt    08/31/2014 1st [facility] office visit/ Larren Copes   Chief Complaint  Patient presents with  . Pulmonary Consult    Referred  by Dr. Murray Hodgkins for eval of pleural effusion. Pt recently hospitalized for PNA and pleural effusion- had thora done 07/25/14. She c/o feeling SOB "pretty much all the time". She has occ cough- prod with clear sputum.    breathing better but not back to baseline as still sense she can't get a deep breath   Ok lying flat/ sleeps fine/ cough is daytime  Note never documented temp above 99.9 during admit and none since d/c reported   - Repeat on R 09/07/2014 >>>  520 cc x exudate /eos 11% x neg cyt      09/15/2014 f/u ov/Mackenzie Groom re: f/u effusion  Chief Complaint  Patient presents with  . Follow-up    Pt states her breathing is no better since her last visit. No new co's today.   able to lie back in bed at usual angle, Not limited by breathing from desired activities   sense of  pnds is day > noct with urge to clear throat disprorportionate to mucus production which is minimal.  Rec Prednisone 10 mg take  4 each am x 2 days,   2 each am x 2 days,  1 each am x 2 days and stop  Pantoprazole (protonix) 40 mg   Take  30-60 min before first meal of the day and Pepcid (famotidine)  20 mg one @  bedtime until return to office - this is the best way to tell whether stomach acid is contributing to your problem.   GERD diet    10/26/2014 f/u ov/Roosevelt Eimers re: uacs/ f/u effusion/ new hives  Chief Complaint  Patient presents with  . Follow-up  review cxr.  pt has been breaking out in hives since starting protonix and pepcid.       No obvious day to day or daytime variability or assoc   cp or chest tightness, subjective wheeze or overt sinus or hb symptoms. No unusual exp hx or h/o childhood pna/ asthma or knowledge of premature birth.  Sleeping ok without nocturnal  or early am exacerbation  of respiratory  c/o's or need for noct saba. Also denies any obvious fluctuation of symptoms with weather or environmental changes or other aggravating or alleviating factors except as outlined above   Current  Medications, Allergies, Complete Past Medical History, Past Surgical History, Family History, and Social History were reviewed in [employer] record.  ROS  The following are not active complaints unless bolded sore throat, dysphagia, dental problems, itching, sneezing,  nasal congestion or excess/ purulent secretions, ear ache,   fever, chills, sweats, unintended wt loss, classically pleuritic or exertional cp, hemoptysis,  orthopnea pnd or leg swelling, presyncope, palpitations, abdominal pain, anorexia, nausea, vomiting, diarrhea  or change in bowel or bladder habits, change in stools or urine, dysuria,hematuria,  rash, arthralgias, visual complaints, headache, numbness, weakness or ataxia or problems with walking or coordination,  change in mood/affect or memory.                Objective:   Physical Exam  Thin amb wf nad except  voice fatigue and pseudowheeze   09/15/14              104 > 10/26/2014  104 Wt Readings from Last 3 Encounters:  08/31/14 104 lb 6.4 oz (47.356 kg)  08/18/14 104 lb (47.174 kg)  08/04/14 106 lb (48.081 kg)    Vital signs reviewed  HEENT: nl dentition, turbinates, and orophanx. Nl external ear canals without cough reflex   NECK :  without JVD/Nodes/TM/ nl carotid upstrokes bilaterally   LUNGS: no acc muscle use, minimal dullness R Base     CV:  RRR  no s3 or murmur or increase in P2, no edema   ABD:  soft and nontender with nl excursion in the supine position. No bruits or organomegaly, bowel sounds nl  MS:  warm without deformities, calf tenderness, cyanosis or clubbing  SKIN: minimal whelps over neck and arms     NEURO:  alert, approp, no deficits       Labs   reviewed:    Lab 08/31/14 1545  NA 132*  K 4.5  CL 95*  CO2 29  BUN 22  CREATININE 0.98  GLUCOSE 74      Lab 08/31/14 1545  HGB 12.6  HCT 38.4  WBC 8.4  PLT 323.0  With no eosinophilia    Lab Results  Component Value Date   TSH 1.35 08/31/2014      Lab Results  Component Value Date   PROBNP 141.0* 08/31/2014     Lab Results  Component Value Date   ESRSEDRATE 27* 08/31/2014   Alb up to 4.2        CXR PA and Lateral:   10/26/2014 :     I personally reviewed images and agree with radiology impression as follows:      1. Persistent right pleural effusion with mild right basilar volume loss. 2. Slight hyper aeration remains. Assessment & Plan:

## 2014-10-26 NOTE — Patient Instructions (Signed)
Please see patient coordinator before you leave today  to schedule sinus CT   Pepcid 20 mg after bfast and supper  Zyrtec 10 mg one daily as needed for drainage or itching   Prednisone 10 mg take  4 each am x 2 days,   2 each am x 2 days,  1 each am x 2 days and stop  Please schedule a follow up office visit in 6 weeks, call sooner if needed with a final cxr

## 2014-10-26 NOTE — Telephone Encounter (Signed)
Called spoke with pt. Her zyrtec and prednisone was not called into the pharm. Advised zyrtec is OTC but reports she needs an RX sent in so they will deliver this to her. I have done so. Nothing further needed

## 2014-10-27 ENCOUNTER — Ambulatory Visit: Payer: Medicare Other | Admitting: Internal Medicine

## 2014-10-27 LAB — ALLERGY FULL PROFILE
Allergen, D pternoyssinus,d7: <0.1 kU/L
Allergen,Goose feathers, e70: <0.1 kU/L
Alternaria Alternata: <0.1 kU/L
Bahia Grass: <0.1 kU/L
Box Elder IgE: <0.1 kU/L
Cat Dander: <0.1 kU/L
Curvularia lunata: <0.1 kU/L
D. farinae: <0.1 kU/L
Dog Dander: <0.1 kU/L
Elm IgE: <0.1 kU/L
Fescue: <0.1 kU/L
G005 Rye, Perennial: <0.1 kU/L
G009 Red Top: <0.1 kU/L
Helminthosporium halodes: <0.1 kU/L
House Dust Hollister: <0.1 kU/L
IGE (IMMUNOGLOBULIN E), SERUM: 5 kU/L (ref <115)
Lamb's Quarters: <0.1 kU/L
Plantain: <0.1 kU/L
Sycamore Tree: <0.1 kU/L

## 2014-10-29 ENCOUNTER — Encounter: Payer: Self-pay | Admitting: Internal Medicine

## 2014-10-29 DIAGNOSIS — T50905A Adverse effect of unspecified drugs, medicaments and biological substances, initial encounter: Secondary | ICD-10-CM | POA: Insufficient documentation

## 2014-10-29 NOTE — Assessment & Plan Note (Signed)
-   R thoracentesis Jul 24 2014 x 620cc x exudate eos 43% x neg cyt  - Repeat on R 09/07/2014 >>>  520 cc x exudate /eos 11% x neg cyt   Most likely will be left with small R pleural scarring/ note drop in eos on subsequent tap so doubt we're dealing with any significant eosinophilic despite new rash that is likely a drug  reaction >  Discussed in detail all the  indications, usual  risks and alternatives  relative to the benefits with patient who agrees with conservative f/u in 6 weeks with cxr

## 2014-10-29 NOTE — Assessment & Plan Note (Addendum)
Allergy profile 10/26/14 >  Eos 0.1/ Ige 5 and neg RAST  No evidence of allergic mech so still favor  Classic Upper airway cough syndrome, so named because it's frequently impossible to sort out how much is  CR/sinusitis with freq throat clearing (which can be related to primary GERD)   vs  causing  secondary (" extra esophageal")  GERD from wide swings in gastric pressure that occur with throat clearing, often  promoting self use of mint and menthol lozenges that reduce the lower esophageal sphincter tone and exacerbate the problem further in a cyclical fashion.   These are the same pts (now being labeled as having "irritable larynx syndrome" by some cough centers) who not infrequently have a history of having failed to tolerate ace inhibitors,  dry powder inhalers or biphosphonates or report having atypical reflux symptoms that don't respond to standard doses of PPI , and are easily confused as having aecopd or asthma flares by even experienced allergists/ pulmonologists.   rx max rx with zyrtec (or 1st gen H1 if tolerates sedating effects)  and  Max h2 and leave off ppi as the most likely cause of the rash and rx with short course prednisone only   Also rec f/u sinus ct at this point   I had an extended discussion with the patient reviewing all relevant studies completed to date and  lasting 15 to 20 minutes of a 25 minute visit    Each maintenance medication was reviewed in detail including most importantly the difference between maintenance and prns and under what circumstances the prns are to be triggered using an action plan format that is not reflected in the computer generated alphabetically organized AVS.    Please see instructions for details which were reviewed in writing and the patient given a copy highlighting the part that I personally wrote and discussed at today's ov.

## 2014-10-29 NOTE — Assessment & Plan Note (Signed)
prob PPI hives > d/c

## 2014-10-29 NOTE — Assessment & Plan Note (Signed)
Only occurs with exertion/ likely related to small residual effusion and uacs/ will follow for now

## 2014-10-31 ENCOUNTER — Ambulatory Visit (INDEPENDENT_AMBULATORY_CARE_PROVIDER_SITE_OTHER)
Admission: RE | Admit: 2014-10-31 | Discharge: 2014-10-31 | Disposition: A | Discharge status: Home / Self Care | Payer: Medicare Other | Source: Ambulatory Visit | Attending: Internal Medicine | Admitting: Internal Medicine

## 2014-10-31 DIAGNOSIS — R05 Cough: Secondary | ICD-10-CM

## 2014-10-31 DIAGNOSIS — R058 Other specified cough: Secondary | ICD-10-CM

## 2014-10-31 DIAGNOSIS — J3489 Other specified disorders of nose and nasal sinuses: Secondary | ICD-10-CM | POA: Diagnosis not present

## 2014-11-01 ENCOUNTER — Telehealth: Payer: Self-pay | Admitting: Internal Medicine

## 2014-11-01 NOTE — Telephone Encounter (Signed)
Patient returned call for results.  She can be reached at [phone].

## 2014-11-01 NOTE — Telephone Encounter (Signed)
Pt aware of recs.  Nothing further needed. 

## 2014-11-01 NOTE — Telephone Encounter (Signed)
Patient given results. Patient verbalized understanding. -------------------------------------- Patient says that she has itching all over face and down neck. She said that she doesn't have a rash, but is red   Patient stopped taking the Pepcid because she thought that was the cause of the itching, however, she is still having the itching. Patient wants to know what is causing the itching and redness and what can be done to help  Dr. Sherene Sires, please advise.

## 2014-11-01 NOTE — Telephone Encounter (Signed)
Ok to stop pepcid but I strongly doubt it caused the itching so should see primary care and in meantime use benadryl 25 mg q4 h prn

## 2014-11-24 ENCOUNTER — Encounter: Payer: Self-pay | Admitting: Internal Medicine

## 2014-11-24 ENCOUNTER — Non-Acute Institutional Stay: Payer: Medicare Other | Admitting: Internal Medicine

## 2014-11-24 VITALS — BP 118/62 | HR 68 | Temp 97.2°F | Wt 106.0 lb

## 2014-11-24 DIAGNOSIS — R05 Cough: Secondary | ICD-10-CM

## 2014-11-24 DIAGNOSIS — J948 Other specified pleural conditions: Secondary | ICD-10-CM

## 2014-11-24 DIAGNOSIS — R058 Other specified cough: Secondary | ICD-10-CM

## 2014-11-24 DIAGNOSIS — T50905A Adverse effect of unspecified drugs, medicaments and biological substances, initial encounter: Secondary | ICD-10-CM

## 2014-11-24 DIAGNOSIS — I10 Essential (primary) hypertension: Secondary | ICD-10-CM | POA: Diagnosis not present

## 2014-11-24 DIAGNOSIS — R634 Abnormal weight loss: Secondary | ICD-10-CM

## 2014-11-24 DIAGNOSIS — T887XXA Unspecified adverse effect of drug or medicament, initial encounter: Secondary | ICD-10-CM | POA: Diagnosis not present

## 2014-11-24 DIAGNOSIS — M545 Low back pain, unspecified: Secondary | ICD-10-CM

## 2014-11-24 DIAGNOSIS — J90 Pleural effusion, not elsewhere classified: Secondary | ICD-10-CM

## 2014-11-24 NOTE — Progress Notes (Signed)
Patient ID: [name], female   DOB: 11/30/1924, 81 y.o.   MRN: [id number]    [facility] ([facility]     Allergies  Allergen Reactions  . Doxycycline Itching  . Lipitor [Atorvastatin] Other (See Comments)    PAIN IN LEGS    Chief Complaint  Patient presents with  . Medical Management of Chronic Issues    Pleural effusion, blood pressure  . dry skin    especially face, neck. Itching at night    HPI:  Pleural effusion, right - patient continues follow-up with Dr. Melvyn Novas. Last visit with him was 10/26/2014. He treated her for upper airways cough with penicillin as well as treated a allergic reaction to drug that was used to help her. She cannot recall the name of the drug she had a reaction to, but it did break her out in welts. Prednisone did not seem to help either the cough or the rash much. The close of that visit a chest x-ray was done which showed a continued presence of a right pleural effusion. She also had a later CT of the maxillofacial area on 10/31/2014 and did not show any problems in the sinus area. She has follow-up appointment 12/12/2014 with Dr. Melvyn Novas.  Essential hypertension - controlled  Loss of weight - he has gained back a couple of pounds.  Midline low back pain without sciatica - back pains have stabilized. The other every day. She is going to be seeing orthopedist in a few months. She continues to receive benefit by using hydrocodone.  Upper airway cough syndrome - cough has improved somewhat but for unclear reasons.  Adverse drug effect, initial encounter - on occurrence welts and rash after taking Pepcid. Patient has used topical steroid as well as 2 short term steroid courses prescribed by Dr. Melvyn Novas. His last recommendation is that she continue use Benadryl as needed for the itching. Patient says that she is also seen a dermatologist, but cannot recall explicitly what was told her.    Medications: Patient's  Medications  New Prescriptions   No medications on file  Previous Medications   AMLODIPINE (NORVASC) 5 MG TABLET    TAKE 1 TABLET DAILY FOR BLOOD PRESSURE.   ASPIRIN 81 MG TABLET    Take 81 mg by mouth daily.     CARVEDILOL (COREG) 3.125 MG TABLET    Take 1 tablet (3.125 mg total) by mouth 2 (two) times daily with a meal.   CHOLECALCIFEROL (VITAMIN D) 1000 UNITS TABLET    Take 1,000 Units by mouth daily. Take one tablet a daily   CRESTOR 10 MG TABLET    TAKE 1 TABLET DAILY TO LOWER CHOLESTEROL.   DOCUSATE SODIUM (COLACE) 100 MG CAPSULE    Take 100 mg by mouth. Take one tablet 3 times daily to prevent constipation   FAMOTIDINE (PEPCID) 20 MG TABLET    One  after bfast and supper   HYDROCODONE-ACETAMINOPHEN (NORCO/VICODIN) 5-325 MG PER TABLET    Take 1 tablet by mouth 3 (three) times daily. Three times a day as needed for pain   KETOROLAC (ACULAR) 0.4 % SOLN    Place 1 drop into the left eye 3 (three) times daily.    MAGNESIUM GLUCONATE 500 (27 MG) MG TABS    Take 1 tablet by mouth. Take one tablet once daily   MULTIPLE VITAMINS-MINERALS (PRESERVISION AREDS PO)    Take 1 tablet by mouth 2 (two) times daily. Take  one tablet twice daily for vision   MULTIVITAMIN (THERAGRAN) PER TABLET    Take 1 tablet by mouth daily.     OFLOXACIN (OCUFLOX) 0.3 % OPHTHALMIC SOLUTION    Place 1 drop into the left eye 3 (three) times daily.   Modified Medications   No medications on file  Discontinued Medications   CETIRIZINE (ZYRTEC ALLERGY) 10 MG TABLET    Take 1 tablet (10 mg total) by mouth daily as needed for allergies.   PREDNISOLONE ACETATE (PRED FORTE) 1 % OPHTHALMIC SUSPENSION    Place 1 drop into the left eye 3 (three) times daily.    PREDNISONE (DELTASONE) 10 MG TABLET    Take 4 tabs daily x 2 days, 2 tabs daily x 2 days, 1 tab daily x 2 days then stop     Review of Systems  Constitutional: Positive for fatigue. Negative for activity change and appetite change.  HENT: Positive for voice change  (chronic dysphonia).        History of goiter.  Eyes:       Cataracts  Respiratory: Positive for cough and shortness of breath.        Right pleural effusion. Pneumonia in May 2016 Produces clear mucus with cough. Chronic hoarseness.  Cardiovascular: Negative for chest pain, palpitations and leg swelling.  Gastrointestinal:       Mild dysphagia and anorexia.  Endocrine: Negative.   Genitourinary: Positive for flank pain (right, associated with pleural effusion).  Musculoskeletal: Positive for back pain.       Pains radiate from low back down the right leg.   Skin: Negative.   Neurological:       Remote hx CVA.  Psychiatric/Behavioral: The patient is nervous/anxious.     Filed Vitals:   11/24/14 1548  BP: 118/62  Pulse: 68  Temp: 97.2 F (36.2 C)  TempSrc: Oral  Weight: 106 lb (48.081 kg)  SpO2: 97%   Body mass index is 19.38 kg/(m^2).  Physical Exam  Constitutional: She is oriented to person, place, and time. She appears well-developed. No distress.  Thin  HENT:  Head: Normocephalic.  Loss of hearing  Eyes: Conjunctivae and EOM are normal. Pupils are equal, round, and reactive to light.  Neck: Neck supple. No JVD present. No tracheal deviation present. Thyromegaly present.  Cardiovascular: Normal rate, regular rhythm, normal heart sounds and intact distal pulses.  Exam reveals no gallop and no friction rub.   No murmur heard. Bilateral mild varicose veins.  Pulmonary/Chest: Breath sounds normal. No respiratory distress. She has no wheezes. She has no rales.  Chronic hoarseness. Dullness with percussion of RLL. Absent breath sounds in right lower lobe posteriorly.  Abdominal: Bowel sounds are normal. She exhibits no distension and no mass. There is no tenderness.  Musculoskeletal: Normal range of motion. She exhibits no edema or tenderness.  Bony sacrum and coccyx prominent  Lymphadenopathy:    She has no cervical adenopathy.  Neurological: She is alert and oriented  to person, place, and time. She has normal reflexes. No cranial nerve deficit. She exhibits normal muscle tone. Coordination normal.  Skin: No rash noted. No erythema. No pallor.  Psychiatric: She has a normal mood and affect. Her behavior is normal. Judgment and thought content normal.     Labs reviewed: Lab Summary Latest Ref Rng 10/26/2014 08/31/2014 08/23/2014 07/25/2014 07/25/2014  Hemoglobin 12.0 - 15.0 g/dL 12.9 12.6 12.1 (None) 11.0(L)  Hematocrit 36.0 - 46.0 % 38.7 38.4 37 (None) 33.4(L)  White count 4.0 -  10.5 K/uL 9.4 8.4 7.1 (None) 9.1  Platelet count 150.0 - 400.0 K/uL 293.0 323.0 303 (None) 267  Sodium 135 - 145 mEq/L (None) 132(L) 139 (None) 137  Potassium 3.5 - 5.1 mEq/L (None) 4.5 4.3 (None) 3.9  Calcium 8.4 - 10.5 mg/dL (None) 9.7 (None) (None) 8.6(L)  Phosphorus - (None) (None) (None) (None) (None)  Creatinine 0.40 - 1.20 mg/dL (None) 0.98 0.9 (None) 0.78  AST 0 - 37 U/L (None) 21 (None) (None) 19  Alk Phos 39 - 117 U/L (None) 64 (None) (None) 60  Bilirubin 0.2 - 1.2 mg/dL (None) 0.5 (None) (None) 0.6  Glucose 70 - 99 mg/dL (None) 74 97 (None) 101(H)  Cholesterol - (None) (None) (None) (None) (None)  HDL cholesterol - (None) (None) (None) (None) (None)  Triglycerides - (None) (None) (None) (None) (None)  LDL Direct - (None) (None) (None) (None) (None)  LDL Calc - (None) (None) (None) (None) (None)  Total protein 6.0 - 8.3 g/dL (None) 7.8 (None) 6.4(L) 6.2(L)  Albumin 3.5 - 5.2 g/dL (None) 4.2 (None) 3.2(L) 3.1(L)   Lab Results  Component Value Date   TSH 1.35 08/31/2014   Lab Results  Component Value Date   BUN 22 08/31/2014   Lab Results  Component Value Date   HGBA1C 5.7* 07/25/2014       Assessment/Plan  1. Pleural effusion, right Follow-up Dr. Melvyn Novas 12/12/14  2. Essential hypertension Controlled  3. Loss of weight Regained 2 pounds  4. Midline low back pain without sciatica See Dr. Nelva Bush as scheduled  5. Upper airway cough  syndrome Persistent problem. Continue see Dr. Melvyn Novas. Has tried Zyrtec plus prednisone.  6. Adverse drug effect, initial encounter Recommended moisturizers for the skin. use Benadryl as needed.

## 2014-12-09 ENCOUNTER — Ambulatory Visit: Payer: Medicare Other | Admitting: Internal Medicine

## 2014-12-12 ENCOUNTER — Encounter: Payer: Self-pay | Admitting: Internal Medicine

## 2014-12-12 ENCOUNTER — Ambulatory Visit (INDEPENDENT_AMBULATORY_CARE_PROVIDER_SITE_OTHER): Payer: Medicare Other | Admitting: Internal Medicine

## 2014-12-12 VITALS — BP 112/68 | HR 74 | Ht 62.0 in | Wt 107.0 lb

## 2014-12-12 DIAGNOSIS — R058 Other specified cough: Secondary | ICD-10-CM

## 2014-12-12 DIAGNOSIS — J948 Other specified pleural conditions: Secondary | ICD-10-CM

## 2014-12-12 DIAGNOSIS — J90 Pleural effusion, not elsewhere classified: Secondary | ICD-10-CM

## 2014-12-12 DIAGNOSIS — R05 Cough: Secondary | ICD-10-CM

## 2014-12-12 NOTE — Progress Notes (Signed)
Subjective:    Patient ID: [name], female    DOB: 1925-02-11,    MRN: [id number]    Brief patient profile:  45 yowf never smoker  S/p admit with ?cap/parapneumonic effusion     Admit date: 07/24/2014 Discharge date: 07/28/2014  Discharge Diagnosis:   Principal Problem:   CAP (community acquired pneumonia) Active Problems:   Hyperglycemia   Pleural effusion, right   Pneumonitis   Tricuspid valve regurgitation, mild-moderate   Mild-moderate pulmonary hypertension   Discharge disposition: Home.   Discharge Condition: Improved.  Diet recommendation: Low sodium, heart healthy.   History of Present Illness:   [name] is an 81 y.o. female with a PMH of hypertension, hyperlipidemia and cerebrovascular disease who was admitted 07/24/14 with sore throat/chills/fever/ R pleuritic cp with new R effusion   Hospital Course by Problem:   Principal Problem:  CAP (community acquired pneumonia) / right pleural effusion / pneumonitis - Treated with azithromycin and Rocephin with clinical improvement, will discharge on an additional 4 days of treatment with Ceftin. - CT of the chest ordered on admission showed right sided pneumonitis and a moderate-large pleural effusion. - Status post thoracentesis. Follow-up pleural fluid cytology, unfortunately cultures not sent. - Repeat CXR in 4-6 weeks to exclude any underlying pathology. - 2-D echocardiogram showed normal ventricular function.  Active Problems:  Chest pain - Troponins negative 3. - Continue aspirin and Crestor. Carvedilol started.   Hyperglycemia - Hemoglobin A1c 5.7%, corresponding to a mean plasma glucose of 117 consistent with prediabetes. - The patient was counseled about this finding     R thoracentesis Jul 24 2014 x 620cc x exudate eos 43% x neg cyt    08/31/2014 1st [facility] office visit/ Tylek Boney   Chief Complaint  Patient presents with  . Pulmonary Consult    Referred  by Dr. Murray Hodgkins for eval of pleural effusion. Pt recently hospitalized for PNA and pleural effusion- had thora done 07/25/14. She c/o feeling SOB "pretty much all the time". She has occ cough- prod with clear sputum.    breathing better but not back to baseline as still sense she can't get a deep breath   Ok lying flat/ sleeps fine/ cough is daytime  Note never documented temp above 99.9 during admit and none since d/c reported   - Repeat on R 09/07/2014 >>>  520 cc x exudate /eos 11% x neg cyt      09/15/2014 f/u ov/Corita Allinson re: f/u effusion  Chief Complaint  Patient presents with  . Follow-up    Pt states her breathing is no better since her last visit. No new co's today.   able to lie back in bed at usual angle, Not limited by breathing from desired activities   sense of  pnds is day > noct with urge to clear throat disprorportionate to mucus production which is minimal.  Rec Prednisone 10 mg take  4 each am x 2 days,   2 each am x 2 days,  1 each am x 2 days and stop  Pantoprazole (protonix) 40 mg   Take  30-60 min before first meal of the day and Pepcid (famotidine)  20 mg one @  bedtime until return to office - this is the best way to tell whether stomach acid is contributing to your problem.   GERD diet    10/26/2014 f/u ov/Darnetta Kesselman re: uacs/ f/u effusion/ new hives  Chief Complaint  Patient presents with  . Follow-up  review cxr.  pt has been breaking out in hives since starting protonix and pepcid.    rec Please see patient coordinator before you leave today  to schedule sinus CT  Pepcid 20 mg after bfast and supper Zyrtec 10 mg one daily as needed for drainage or itching  Prednisone 10 mg take  4 each am x 2 days,   2 each am x 2 days,  1 each am x 2 days and stop    12/12/2014  f/u ov/Forest Pruden re: ? Recurrent symptomatic R effusion  Chief Complaint  Patient presents with  . Follow-up    Pt states that her breathing is unchanged. She started having pain again "all over my  stomach" for the past 1-2 wks, esp after she eats.   no sob/ main problem is dry cough sporadic does not wake her up,  affects voice mostly - has decided she's allergic to ppi and h2 for unclear reasons  Lower abd/pelvic  pain most every tme she eats while still sitting at table s assoc change in urinary /bowel habits or vag d/c  No obvious day to day or daytime variability or assoc   cp or chest tightness, subjective wheeze or overt sinus or hb symptoms. No unusual exp hx or h/o childhood pna/ asthma or knowledge of premature birth.  Sleeping ok without nocturnal  or early am exacerbation  of respiratory  c/o's or need for noct saba. Also denies any obvious fluctuation of symptoms with weather or environmental changes or other aggravating or alleviating factors except as outlined above   Current Medications, Allergies, Complete Past Medical History, Past Surgical History, Family History, and Social History were reviewed in [employer] record.  ROS  The following are not active complaints unless bolded sore throat, dysphagia, dental problems, itching, sneezing,  nasal congestion or excess/ purulent secretions, ear ache,   fever, chills, sweats, unintended wt loss, classically pleuritic or exertional cp, hemoptysis,  orthopnea pnd or leg swelling, presyncope, palpitations, abdominal pain, anorexia, nausea, vomiting, diarrhea  or change in bowel or bladder habits, change in stools or urine, dysuria,hematuria,  rash, arthralgias, visual complaints, headache, numbness, weakness or ataxia or problems with walking or coordination,  change in mood/affect or memory.                Objective:   Physical Exam  Thin amb wf nad except  voice fatigue and pseudowheeze   09/15/14             104 > 10/26/2014  104 > 12/12/2014   107  Wt Readings from Last 3 Encounters:  08/31/14 104 lb 6.4 oz (47.356 kg)  08/18/14 104 lb (47.174 kg)  08/04/14 106 lb (48.081 kg)    Vital signs  reviewed  HEENT: nl dentition, turbinates, and orophanx. Nl external ear canals without cough reflex   NECK :  without JVD/Nodes/TM/ nl carotid upstrokes bilaterally   LUNGS: no acc muscle use,  Minimal dullness R base    CV:  RRR  no s3 or murmur or increase in P2, no edema   ABD:  soft and nontender with nl excursion in the supine position. No bruits or organomegaly, bowel sounds nl  MS:  warm without deformities, calf tenderness, cyanosis or clubbing  SKIN:  No rash    NEURO:  alert, approp, no deficits       Labs   reviewed:    Lab 08/31/14 1545  NA 132*  K 4.5  CL 95*  CO2 29  BUN 22  CREATININE 0.98  GLUCOSE 74      Lab 08/31/14 1545  HGB 12.6  HCT 38.4  WBC 8.4  PLT 323.0  With no eosinophilia    Lab Results  Component Value Date   TSH 1.35 08/31/2014     Lab Results  Component Value Date   PROBNP 141.0* 08/31/2014     Lab Results  Component Value Date   ESRSEDRATE 27* 08/31/2014   Alb up to 4.2             Assessment & Plan:

## 2014-12-12 NOTE — Patient Instructions (Addendum)
GERD (REFLUX)  is an extremely common cause of respiratory symptoms just like yours , many times with no obvious heartburn at all.    It can be treated with medication, but also with lifestyle changes including elevation of the head of your bed (ideally with 6 inch  bed blocks),  Smoking cessation, avoidance of late meals, excessive alcohol, and avoid fatty foods, chocolate, peppermint, colas, red wine, and acidic juices such as orange juice.  NO MINT OR MENTHOL PRODUCTS SO NO COUGH DROPS  USE SUGARLESS CANDY INSTEAD (Jolley ranchers or Stover's or Life Savers) or even ice chips will also do - the key is to swallow to prevent all throat clearing. NO OIL BASED VITAMINS - use powdered substitutes.  I will defer to Dr Chilton Si on whether to refer you to a GI doctor but we have you listed allergic  to the two main drugs that are used to treat this condition  Please see patient coordinator before you leave today  to schedule repeat R thoracentesis to evaluate the fluid and we will call you with the results and let Dr Chilton Si take back over on the follow up for this problem as well as we've done all we can to help you through this  Clinic

## 2014-12-16 ENCOUNTER — Ambulatory Visit (HOSPITAL_COMMUNITY)
Admission: RE | Admit: 2014-12-16 | Discharge: 2014-12-16 | Disposition: A | Discharge status: Home / Self Care | Payer: Medicare Other | Source: Ambulatory Visit | Attending: Internal Medicine | Admitting: Internal Medicine

## 2014-12-16 ENCOUNTER — Ambulatory Visit (HOSPITAL_COMMUNITY)
Admission: RE | Admit: 2014-12-16 | Discharge: 2014-12-16 | Disposition: A | Discharge status: Home / Self Care | Payer: Medicare Other | Source: Ambulatory Visit | Attending: Radiology | Admitting: Radiology

## 2014-12-16 DIAGNOSIS — J90 Pleural effusion, not elsewhere classified: Secondary | ICD-10-CM | POA: Diagnosis not present

## 2014-12-16 DIAGNOSIS — Z9889 Other specified postprocedural states: Secondary | ICD-10-CM

## 2014-12-16 DIAGNOSIS — J948 Other specified pleural conditions: Secondary | ICD-10-CM | POA: Diagnosis not present

## 2014-12-16 DIAGNOSIS — I700 Atherosclerosis of aorta: Secondary | ICD-10-CM | POA: Insufficient documentation

## 2014-12-16 DIAGNOSIS — I272 Other secondary pulmonary hypertension: Secondary | ICD-10-CM | POA: Insufficient documentation

## 2014-12-16 LAB — LACTATE DEHYDROGENASE, PLEURAL OR PERITONEAL FLUID: LD FL: 136 U/L — AB (ref 3–23)

## 2014-12-16 LAB — BODY FLUID CELL COUNT WITH DIFFERENTIAL
EOS FL: 12 %
Lymphs, Fluid: 62 %
MONOCYTE-MACROPHAGE-SEROUS FLUID: 23 % — AB (ref 50–90)
Neutrophil Count, Fluid: 3 % (ref 0–25)
WBC FLUID: 1290 uL — AB (ref 0–1000)

## 2014-12-16 LAB — PROTEIN, BODY FLUID: Total protein, fluid: 4.1 g/dL

## 2014-12-16 LAB — GLUCOSE, SEROUS FLUID: Glucose, Fluid: 87 mg/dL

## 2014-12-16 NOTE — Procedures (Signed)
US guided diagnostic/therapeutic right thoracentesis performed yielding 200 cc yellow fluid. The fluid was sent to the lab for preordered studies. F/u CXR pending. No immediate complications.

## 2014-12-18 NOTE — Assessment & Plan Note (Signed)
Allergy profile 10/26/14 >  Eos 0.1/ Ige 5 and neg RAST - Sinus CT 10/31/2014 > No evidence of acute sinusitis. Small area of mucosal thickening of the left ethmoid air cells.  Almost certainly this is gerd related but has convinced herself she can't take any meds to treat this > rec f/u gi/ent prn

## 2014-12-18 NOTE — Assessment & Plan Note (Signed)
-   R thoracentesis Jul 24 2014 x 620cc x exudate eos 43% x neg cyt  - Repeat on R 09/07/2014 :  520 cc x exudate /eos 11% x neg cyt  - Rec repeat on R  Clinically she has very small residual R Paraneumonic  effusion with prev evidence of pleural eos which are typically seen in benign effusions but she is certain her abd pain improved p prev thoracentesis and would like to have it repeated   Discussed in detail all the  indications, usual  risks and alternatives  relative to the benefits with patient who agrees to proceed with one last tap but unless there is a significant recurrence or something on the fluid analysis that would suggest a more aggressive approach,  I do not rec pleurex and certainly not vats in this setting and can just see her back prn  I had an extended discussion with the patient reviewing all relevant studies completed to date and  lasting 15 to 20 minutes of a 25 minute visit    Each maintenance medication was reviewed in detail including most importantly the difference between maintenance and prns and under what circumstances the prns are to be triggered using an action plan format that is not reflected in the computer generated alphabetically organized AVS.    Please see instructions for details which were reviewed in writing and the patient given a copy highlighting the part that I personally wrote and discussed at today's ov.

## 2014-12-21 NOTE — Progress Notes (Signed)
Quick Note:  Spoke with pt and notified of results per Dr. Wert. Pt verbalized understanding and denied any questions.  ______ 

## 2014-12-21 NOTE — Progress Notes (Signed)
Quick Note:  lmtcb ______ 

## 2014-12-29 ENCOUNTER — Encounter: Payer: Self-pay | Admitting: Nurse Practitioner

## 2014-12-29 ENCOUNTER — Non-Acute Institutional Stay: Payer: Medicare Other | Admitting: Nurse Practitioner

## 2014-12-29 VITALS — BP 120/64 | HR 72 | Temp 97.5°F | Wt 106.0 lb

## 2014-12-29 DIAGNOSIS — I10 Essential (primary) hypertension: Secondary | ICD-10-CM | POA: Diagnosis not present

## 2014-12-29 DIAGNOSIS — K219 Gastro-esophageal reflux disease without esophagitis: Secondary | ICD-10-CM | POA: Diagnosis not present

## 2014-12-29 DIAGNOSIS — J948 Other specified pleural conditions: Secondary | ICD-10-CM | POA: Diagnosis not present

## 2014-12-29 DIAGNOSIS — J90 Pleural effusion, not elsewhere classified: Secondary | ICD-10-CM

## 2014-12-29 NOTE — Assessment & Plan Note (Signed)
Controlled, continue Amlodipine and Carvedilol.

## 2014-12-29 NOTE — Assessment & Plan Note (Addendum)
Stable, continue be off PPI and Famotidine,  hx of itching-not sure which one caused her problem, continue diet control for now.

## 2014-12-29 NOTE — Progress Notes (Signed)
Patient ID: [name], female   DOB: 06-Sep-1924, 81 y.o.   MRN: [id number]  Location:  clinic [facility] Provider:  Chipper Oman NP  Code Status:  DNR Goals of care: Advanced Directive information Does patient have an advance directive?: Yes, Type of Advance Directive: Healthcare Power of [name];Living will  Chief Complaint  Patient presents with  . Medical Management of Chronic Issues    GERD.  12/16/14 thoracentesis     HPI: Patient is a 81 y.o. female seen in the clinic at [facility] today for evaluation of Hx of hypertension, controlled on her home meds Amlodipine and Carvedilol. Her GERD is asymptomatic while on Famotidine, didn't tolerate PPI in the past.   Review of Systems:  Review of Systems  HENT:       History of goiter.  Eyes:       Cataracts  Respiratory: Positive for cough and shortness of breath.        Right pleural effusion. Pneumonia in May 2016 Produces clear mucus with cough. Chronic hoarseness.  Cardiovascular: Negative for chest pain, palpitations and leg swelling.  Gastrointestinal:       Mild dysphagia and anorexia.  Genitourinary: Positive for flank pain (right, associated with pleural effusion).  Musculoskeletal: Positive for back pain.       Pains radiate from low back down the right leg.   Skin: Negative.   Neurological:       Remote hx CVA.  Psychiatric/Behavioral: The patient is nervous/anxious.     Past Medical History  Diagnosis Date  . Vitamin D deficiency 04/25/2011  . Acute upper respiratory infections of unspecified site 2013  . Loss of weight 04/25/2011  . Other drug allergy(995.27) 04/04/2011  . Cellulitis and abscess of hand, except fingers and thumb 10/04/2010  . Urinary tract infection, site not specified   . Pain in limb 2012  . Urticaria, unspecified 11/01/2008  . Myalgia and myositis, unspecified 01/07/2007  . Supraventricular premature beats 10/2006  . Hyperlipidemia 2007  . Hypertension 2007  . Unspecified late  effects of cerebrovascular disease 01/01/2006  . Spinal stenosis, unspecified region other than cervical 2002  . Osteoarthrosis, unspecified whether generalized or localized, unspecified site 07/22/2000  . Nontoxic uninodular goiter 08/15/1998  . Diaphragmatic hernia without mention of obstruction or gangrene 08/15/1998  . Diverticulosis of colon (without mention of hemorrhage) 2000  . Degeneration of intervertebral disc, site unspecified 2000  . Osteoporosis 2000  . Personal history of fall 2012  . Sprain of ankle, left   . Impacted cerumen   . Urine frequency 01/20/2014  . Tricuspid regurgitation 07/25/14    Mild-moderate  . Pulmonary hypertension (HCC) 07/25/14    Mild-moderate    Patient Active Problem List   Diagnosis Date Noted  . GERD (gastroesophageal reflux disease) 12/29/2014  . Adverse drug effect 10/29/2014  . Upper airway cough syndrome 09/17/2014  . Dyspnea 08/31/2014  . Chest pain 07/25/2014  . Hyperglycemia 07/25/2014  . Pleural effusion, right 07/25/2014  . Back pain 07/21/2014  . Urine frequency 01/20/2014  . Pain in hip 07/08/2013  . Personal history of fall   . Hypertension   . Spinal stenosis, unspecified region other than cervical   . Hyperlipidemia   . Impacted cerumen   . Loss of weight 04/25/2011  . Supraventricular premature beats 10/10/2006  . Nontoxic uninodular goiter 08/15/1998    Allergies  Allergen Reactions  . Doxycycline Itching  . Lipitor [Atorvastatin] Other (See Comments)    PAIN IN LEGS  .  Pantoprazole Itching  . Pepcid [Famotidine] Itching    Medications: Patient's Medications  New Prescriptions   No medications on file  Previous Medications   AMLODIPINE (NORVASC) 5 MG TABLET    TAKE 1 TABLET DAILY FOR BLOOD PRESSURE.   ASPIRIN 81 MG TABLET    Take 81 mg by mouth daily.     CARVEDILOL (COREG) 3.125 MG TABLET    Take 1 tablet (3.125 mg total) by mouth 2 (two) times daily with a meal.   CHOLECALCIFEROL (VITAMIN D) 1000 UNITS  TABLET    Take 1,000 Units by mouth daily. Take one tablet a daily   CRESTOR 10 MG TABLET    TAKE 1 TABLET DAILY TO LOWER CHOLESTEROL.   DOCUSATE SODIUM (COLACE) 100 MG CAPSULE    Take 100 mg by mouth. Take one tablet 3 times daily to prevent constipation   HYDROCODONE-ACETAMINOPHEN (NORCO/VICODIN) 5-325 MG PER TABLET    Take 1 tablet by mouth 3 (three) times daily. Three times a day as needed for pain   MAGNESIUM GLUCONATE 500 (27 MG) MG TABS    Take 1 tablet by mouth. Take one tablet once daily   MULTIPLE VITAMINS-MINERALS (PRESERVISION AREDS PO)    Take 1 tablet by mouth 2 (two) times daily. Take one tablet twice daily for vision   MULTIVITAMIN (THERAGRAN) PER TABLET    Take 1 tablet by mouth daily.    Modified Medications   No medications on file  Discontinued Medications   No medications on file    Physical Exam: Filed Vitals:   12/29/14 1444  BP: 120/64  Pulse: 72  Temp: 97.5 F (36.4 C)  TempSrc: Oral  Weight: 106 lb (48.081 kg)  SpO2: 91%   Body mass index is 19.38 kg/(m^2).  Physical Exam  Constitutional: She is oriented to person, place, and time. She appears well-developed. No distress.  Thin  HENT:  Head: Normocephalic.  Loss of hearing  Eyes: Conjunctivae and EOM are normal. Pupils are equal, round, and reactive to light.  Neck: Neck supple. No JVD present. No tracheal deviation present. Thyromegaly present.  Cardiovascular: Normal rate, regular rhythm, normal heart sounds and intact distal pulses.  Exam reveals no gallop and no friction rub.   No murmur heard. Bilateral mild varicose veins.  Pulmonary/Chest: No respiratory distress. She has wheezes. She has no rales.  Chronic hoarseness. Dullness with percussion of RLL. Absent breath sounds in right lower lobe posteriorly.  Abdominal: Bowel sounds are normal. She exhibits no distension and no mass. There is no tenderness.  Musculoskeletal: Normal range of motion. She exhibits no edema or tenderness.  Bony  sacrum and coccyx prominent  Lymphadenopathy:    She has no cervical adenopathy.  Neurological: She is alert and oriented to person, place, and time. She has normal reflexes. No cranial nerve deficit. She exhibits normal muscle tone. Coordination normal.  Skin: No rash noted. No erythema. No pallor.  Psychiatric: She has a normal mood and affect. Her behavior is normal. Judgment and thought content normal.    Labs reviewed: Basic Metabolic Panel:  Recent Labs  01/60/10 2145 07/25/14 0714 08/23/14 08/31/14 1545  NA 135 137 139 132*  K 3.9 3.9 4.3 4.5  CL 98* 102  --  95*  CO2 26 26  --  29  GLUCOSE 124* 101*  --  74  BUN 20 15 15 22   CREATININE 0.93 0.78 0.9 0.98  CALCIUM 9.4 8.6*  --  9.7    Liver Function Tests:  Recent Labs  01/11/14 07/25/14 0714 08/31/14 1545  AST 18 19 21   ALT 8 9* 11  ALKPHOS 65 60 64  BILITOT  --  0.6 0.5  PROT  --  6.4*  6.2* 7.8  ALBUMIN  --  3.2*  3.1* 4.2    CBC:  Recent Labs  07/24/14 2145 07/25/14 0714 08/23/14 08/31/14 1545 10/26/14 1128  WBC 10.6* 9.1 7.1 8.4 9.4  NEUTROABS 7.7  --   --  3.7 5.9  HGB 12.8 11.0* 12.1 12.6 12.9  HCT 38.5 33.4* 37 38.4 38.7  MCV 86.3 86.3  --  86.0 85.9  PLT 281 267 303 323.0 293.0    Lab Results  Component Value Date   TSH 1.35 08/31/2014   Lab Results  Component Value Date   HGBA1C 5.7* 07/25/2014   Lab Results  Component Value Date   CHOL 103 01/11/2014   HDL 46 01/11/2014   LDLCALC 42 01/11/2014   TRIG 75 01/11/2014   CHOLHDL 2.3 10/05/2006    Significant Diagnostic Results since last visit: none  Patient Care Team: 10/07/2006, MD as PCP - General (Internal Medicine) Kimber Relic, MD as PCP - Internal Medicine (Geriatric Medicine) Kimber Relic, MD as Consulting Physician (Neurosurgery) Myrtis Hopping, MD as Consulting Physician (Otolaryngology) Keturah Barre, MD as Consulting Physician (Gastroenterology) Meryl Dare, OD as Consulting Physician  (Optometry) Elliot Cousin, MD as Consulting Physician (Physical Medicine and Rehabilitation) Sheran Luz, MD as Consulting Physician (Orthopedic Surgery) [facility] Ranee Gosselin, NP as Nurse Practitioner (Nurse Practitioner)  Assessment/Plan Problem List Items Addressed This Visit    Hypertension (Chronic)    Controlled, continue Amlodipine and Carvedilol.       Pleural effusion, right - Primary (Chronic)    - R thoracentesis Jul 24 2014 x 620cc x exudate eos 43% x neg cyt  - Repeat on R 09/07/2014 :  520 cc x exudate /eos 11% x neg cyt   - last thoracentesis 12/16/14, CXR done 12/16/14, repeat CXR.        GERD (gastroesophageal reflux disease)    Stable, continue be off PPI and Famotidine,  hx of itching-not sure which one caused her problem, continue diet control for now.           Family/ staff Communication: continue IL  Labs/tests ordered:  CXR 2 weeks.   [facility] Camira Geidel NP Geriatrics [facility] [phone] [address], [place] On Call:  [phone] & follow prompts after 5pm & weekends Office Phone:  [phone] Office Fax:  [phone]

## 2014-12-29 NOTE — Assessment & Plan Note (Addendum)
-   R thoracentesis Jul 24 2014 x 620cc x exudate eos 43% x neg cyt  - Repeat on R 09/07/2014 :  520 cc x exudate /eos 11% x neg cyt   - last thoracentesis 12/16/14, CXR done 12/16/14, repeat CXR.

## 2015-01-12 ENCOUNTER — Ambulatory Visit
Admission: RE | Admit: 2015-01-12 | Discharge: 2015-01-12 | Disposition: A | Discharge status: Home / Self Care | Payer: Medicare Other | Source: Ambulatory Visit | Attending: Nurse Practitioner | Admitting: Nurse Practitioner

## 2015-01-12 DIAGNOSIS — J90 Pleural effusion, not elsewhere classified: Secondary | ICD-10-CM

## 2015-01-16 DIAGNOSIS — M47812 Spondylosis without myelopathy or radiculopathy, cervical region: Secondary | ICD-10-CM | POA: Diagnosis not present

## 2015-01-16 DIAGNOSIS — Z79891 Long term (current) use of opiate analgesic: Secondary | ICD-10-CM | POA: Diagnosis not present

## 2015-01-16 DIAGNOSIS — M47816 Spondylosis without myelopathy or radiculopathy, lumbar region: Secondary | ICD-10-CM | POA: Diagnosis not present

## 2015-01-16 DIAGNOSIS — G894 Chronic pain syndrome: Secondary | ICD-10-CM | POA: Diagnosis not present

## 2015-01-27 ENCOUNTER — Telehealth: Payer: Self-pay

## 2015-01-27 NOTE — Telephone Encounter (Signed)
Returned Mrs Morua's call after talking with [facility], NP about the chest x-ray done 01/12/15 - showed small (R) pleural effusion again noted. No pneumothorax. Told patient there was a small amount of fluid, we don't know if it will get bad to where she would needed it drawn off again. The only way we will know is if she gets SOB, pain, like before. If she gets like that she needs to call and let us know. Has appt with Dr. Chilton Si 03/02/15, if she needs to be seen sooner, let us know. She said she would.

## 2015-02-24 DIAGNOSIS — H353132 Nonexudative age-related macular degeneration, bilateral, intermediate dry stage: Secondary | ICD-10-CM | POA: Diagnosis not present

## 2015-02-24 DIAGNOSIS — H26491 Other secondary cataract, right eye: Secondary | ICD-10-CM | POA: Diagnosis not present

## 2015-02-24 DIAGNOSIS — H04123 Dry eye syndrome of bilateral lacrimal glands: Secondary | ICD-10-CM | POA: Diagnosis not present

## 2015-02-24 DIAGNOSIS — H43813 Vitreous degeneration, bilateral: Secondary | ICD-10-CM | POA: Diagnosis not present

## 2015-02-24 DIAGNOSIS — Z961 Presence of intraocular lens: Secondary | ICD-10-CM | POA: Diagnosis not present

## 2015-03-02 ENCOUNTER — Non-Acute Institutional Stay: Payer: Medicare Other | Admitting: Internal Medicine

## 2015-03-02 ENCOUNTER — Encounter: Payer: Self-pay | Admitting: Internal Medicine

## 2015-03-02 VITALS — BP 124/68 | HR 64 | Temp 98.1°F | Ht 62.0 in | Wt 106.0 lb

## 2015-03-02 DIAGNOSIS — J948 Other specified pleural conditions: Secondary | ICD-10-CM

## 2015-03-02 DIAGNOSIS — J90 Pleural effusion, not elsewhere classified: Secondary | ICD-10-CM

## 2015-03-02 DIAGNOSIS — R634 Abnormal weight loss: Secondary | ICD-10-CM | POA: Diagnosis not present

## 2015-03-02 DIAGNOSIS — I10 Essential (primary) hypertension: Secondary | ICD-10-CM

## 2015-03-02 NOTE — Progress Notes (Signed)
Patient ID: [name], female   DOB: 04-13-1924, 81 y.o.   MRN: [id number]    [facility]     [facility] Service[facility] (12[facility]     Allergies  Allergen Reactions  . Doxycycline Itching  . Lipitor [Atorvastatin] Other (See Comments)    PAIN IN LEGS  . Pantoprazole Itching  . Pepcid [Famotidine] Itching    Chief Complaint  Patient presents with  . Medical Management of Chronic Issues    Medical Management of Chronic Issues. 3 Month follow up    HPI:  Essential hypertension - controlled  Loss of weight - stable  Pleural effusion, right - resolving. Breathing easier. No heavy sensation in the chest. Denies cough. Dyspnea is improved.  Medications: Patient's Medications  New Prescriptions   No medications on file  Previous Medications   AMLODIPINE (NORVASC) 5 MG TABLET    TAKE 1 TABLET DAILY FOR BLOOD PRESSURE.   ASPIRIN 81 MG TABLET    Take 81 mg by mouth daily.     CHOLECALCIFEROL (VITAMIN D) 1000 UNITS TABLET    Take 1,000 Units by mouth daily. Take one tablet a daily   CRESTOR 10 MG TABLET    TAKE 1 TABLET DAILY TO LOWER CHOLESTEROL.   DOCUSATE SODIUM (COLACE) 100 MG CAPSULE    Take 100 mg by mouth. Take one tablet 3 times daily to prevent constipation   HYDROCODONE-ACETAMINOPHEN (NORCO/VICODIN) 5-325 MG PER TABLET    Take 1 tablet by mouth 3 (three) times daily. Three times a day as needed for pain   MAGNESIUM GLUCONATE 500 (27 MG) MG TABS    Take 1 tablet by mouth. Take one tablet once daily   MULTIPLE VITAMINS-MINERALS (PRESERVISION AREDS PO)    Take 1 tablet by mouth 2 (two) times daily. Take one tablet twice daily for vision   MULTIVITAMIN (THERAGRAN) PER TABLET    Take 1 tablet by mouth daily.    Modified Medications   No medications on file  Discontinued Medications   CARVEDILOL (COREG) 3.125 MG TABLET    Take 1 tablet (3.125 mg total) by mouth 2 (two) times daily with a meal.     Review of Systems  Constitutional: Positive for fatigue.  Negative for activity change and appetite change.  HENT: Positive for voice change (chronic dysphonia).        History of goiter.  Eyes:       Cataracts  Respiratory: Positive for cough and shortness of breath.        Hx of Right pleural effusion. Pneumonia in May 2016, Chronic hoarseness.  Cardiovascular: Negative for chest pain, palpitations and leg swelling.  Gastrointestinal:       Mild dysphagia and anorexia.  Endocrine: Negative.   Genitourinary: Positive for flank pain (right, associated with pleural effusion).  Musculoskeletal: Positive for back pain.       Pains radiate from low back down the right leg.   Skin: Negative.   Neurological:       Remote hx CVA.  Psychiatric/Behavioral: The patient is nervous/anxious.     Filed Vitals:   03/02/15 1413  BP: 124/68  Pulse: 64  Temp: 98.1 F (36.7 C)  TempSrc: Oral  Height: 5' 2"  (1.575 m)  Weight: 106 lb (48.081 kg)   Body mass index is 19.38 kg/(m^2).  Wt Readings from Last 3 Encounters:  03/02/15 106 lb (48.081 kg)  12/29/14 106 lb (48.081 kg)  12/12/14 107 lb (48.535 kg)      Physical  Exam  Constitutional: She is oriented to person, place, and time. She appears well-developed. No distress.  Thin  HENT:  Head: Normocephalic.  Loss of hearing  Eyes: Conjunctivae and EOM are normal. Pupils are equal, round, and reactive to light.  Neck: Neck supple. No JVD present. No tracheal deviation present. Thyromegaly present.  Cardiovascular: Normal rate, regular rhythm, normal heart sounds and intact distal pulses.  Exam reveals no gallop and no friction rub.   No murmur heard. Bilateral mild varicose veins.  Pulmonary/Chest: Breath sounds normal. No respiratory distress. She has no wheezes. She has no rales.  Chronic hoarseness.  Abdominal: Bowel sounds are normal. She exhibits no distension and no mass. There is no tenderness.  Musculoskeletal: Normal range of motion. She exhibits no edema or tenderness.  Bony sacrum  and coccyx prominent  Lymphadenopathy:    She has no cervical adenopathy.  Neurological: She is alert and oriented to person, place, and time. She has normal reflexes. No cranial nerve deficit. She exhibits normal muscle tone. Coordination normal.  Skin: No rash noted. No erythema. No pallor.  Psychiatric: She has a normal mood and affect. Her behavior is normal. Judgment and thought content normal.     Labs reviewed: Lab Summary Latest Ref Rng 10/26/2014 08/31/2014 08/23/2014 07/25/2014 07/25/2014  Hemoglobin 12.0 - 15.0 g/dL 12.9 12.6 12.1 (None) 11.0(L)  Hematocrit 36.0 - 46.0 % 38.7 38.4 37 (None) 33.4(L)  White count 4.0 - 10.5 K/uL 9.4 8.4 7.1 (None) 9.1  Platelet count 150.0 - 400.0 K/uL 293.0 323.0 303 (None) 267  Sodium 135 - 145 mEq/L (None) 132(L) 139 (None) 137  Potassium 3.5 - 5.1 mEq/L (None) 4.5 4.3 (None) 3.9  Calcium 8.4 - 10.5 mg/dL (None) 9.7 (None) (None) 8.6(L)  Phosphorus - (None) (None) (None) (None) (None)  Creatinine 0.40 - 1.20 mg/dL (None) 0.98 0.9 (None) 0.78  AST 0 - 37 U/L (None) 21 (None) (None) 19  Alk Phos 39 - 117 U/L (None) 64 (None) (None) 60  Bilirubin 0.2 - 1.2 mg/dL (None) 0.5 (None) (None) 0.6  Glucose 70 - 99 mg/dL (None) 74 97 (None) 101(H)  Cholesterol - (None) (None) (None) (None) (None)  HDL cholesterol - (None) (None) (None) (None) (None)  Triglycerides - (None) (None) (None) (None) (None)  LDL Direct - (None) (None) (None) (None) (None)  LDL Calc - (None) (None) (None) (None) (None)  Total protein 6.0 - 8.3 g/dL (None) 7.8 (None) 6.4(L) 6.2(L)  Albumin 3.5 - 5.2 g/dL (None) 4.2 (None) 3.2(L) 3.1(L)   Lab Results  Component Value Date   TSH 1.35 08/31/2014   Lab Results  Component Value Date   BUN 22 08/31/2014   Lab Results  Component Value Date   HGBA1C 5.7* 07/25/2014   Dg Chest 2 View  01/12/2015  CLINICAL DATA:  Thoracentesis. EXAM: CHEST  2 VIEW COMPARISON:  12/16/2014. FINDINGS: Mediastinum hilar structures are normal. Heart  size normal. Small right pleural effusion. No evidence of pneumothorax. No acute osseous abnormality. IMPRESSION: 1. Small right pleural effusion again noted. 2. No pneumothorax. Electronically Signed   By: Marcello Moores  Register   On: 01/12/2015 11:47    Assessment/Plan  1. Essential hypertension controlled  2. Loss of weight stable  3. Pleural effusion, right improved

## 2015-03-14 ENCOUNTER — Other Ambulatory Visit: Payer: Self-pay | Admitting: Internal Medicine

## 2015-03-23 ENCOUNTER — Encounter (INDEPENDENT_AMBULATORY_CARE_PROVIDER_SITE_OTHER): Payer: Medicare Other | Admitting: Ophthalmology

## 2015-03-23 DIAGNOSIS — H04123 Dry eye syndrome of bilateral lacrimal glands: Secondary | ICD-10-CM | POA: Diagnosis not present

## 2015-03-23 DIAGNOSIS — H353134 Nonexudative age-related macular degeneration, bilateral, advanced atrophic with subfoveal involvement: Secondary | ICD-10-CM

## 2015-03-23 DIAGNOSIS — H43813 Vitreous degeneration, bilateral: Secondary | ICD-10-CM

## 2015-03-23 DIAGNOSIS — H35033 Hypertensive retinopathy, bilateral: Secondary | ICD-10-CM | POA: Diagnosis not present

## 2015-03-23 DIAGNOSIS — H33301 Unspecified retinal break, right eye: Secondary | ICD-10-CM

## 2015-03-23 DIAGNOSIS — H353132 Nonexudative age-related macular degeneration, bilateral, intermediate dry stage: Secondary | ICD-10-CM | POA: Diagnosis not present

## 2015-03-23 DIAGNOSIS — H33311 Horseshoe tear of retina without detachment, right eye: Secondary | ICD-10-CM | POA: Diagnosis not present

## 2015-03-23 DIAGNOSIS — H26491 Other secondary cataract, right eye: Secondary | ICD-10-CM | POA: Diagnosis not present

## 2015-03-23 DIAGNOSIS — I10 Essential (primary) hypertension: Secondary | ICD-10-CM | POA: Diagnosis not present

## 2015-04-03 ENCOUNTER — Other Ambulatory Visit: Payer: Self-pay | Admitting: Internal Medicine

## 2015-04-11 ENCOUNTER — Ambulatory Visit (INDEPENDENT_AMBULATORY_CARE_PROVIDER_SITE_OTHER): Payer: Medicare Other | Admitting: Ophthalmology

## 2015-04-11 DIAGNOSIS — H33301 Unspecified retinal break, right eye: Secondary | ICD-10-CM

## 2015-04-11 DIAGNOSIS — H43813 Vitreous degeneration, bilateral: Secondary | ICD-10-CM

## 2015-04-11 DIAGNOSIS — H353134 Nonexudative age-related macular degeneration, bilateral, advanced atrophic with subfoveal involvement: Secondary | ICD-10-CM

## 2015-04-29 ENCOUNTER — Emergency Department (HOSPITAL_COMMUNITY): Payer: Medicare Other

## 2015-04-29 ENCOUNTER — Encounter (HOSPITAL_COMMUNITY): Payer: Self-pay | Admitting: Emergency Medicine

## 2015-04-29 ENCOUNTER — Observation Stay (HOSPITAL_COMMUNITY)
Admission: EM | Admit: 2015-04-29 | Discharge: 2015-05-01 | Disposition: A | Discharge status: Home / Self Care | Payer: Medicare Other | Attending: Internal Medicine | Admitting: Internal Medicine

## 2015-04-29 DIAGNOSIS — Z7982 Long term (current) use of aspirin: Secondary | ICD-10-CM | POA: Diagnosis not present

## 2015-04-29 DIAGNOSIS — Z79899 Other long term (current) drug therapy: Secondary | ICD-10-CM | POA: Diagnosis not present

## 2015-04-29 DIAGNOSIS — I081 Rheumatic disorders of both mitral and tricuspid valves: Secondary | ICD-10-CM | POA: Diagnosis not present

## 2015-04-29 DIAGNOSIS — R079 Chest pain, unspecified: Secondary | ICD-10-CM | POA: Diagnosis not present

## 2015-04-29 DIAGNOSIS — R55 Syncope and collapse: Principal | ICD-10-CM

## 2015-04-29 DIAGNOSIS — Z9181 History of falling: Secondary | ICD-10-CM | POA: Insufficient documentation

## 2015-04-29 DIAGNOSIS — E785 Hyperlipidemia, unspecified: Secondary | ICD-10-CM | POA: Diagnosis not present

## 2015-04-29 DIAGNOSIS — R002 Palpitations: Secondary | ICD-10-CM

## 2015-04-29 DIAGNOSIS — I10 Essential (primary) hypertension: Secondary | ICD-10-CM | POA: Insufficient documentation

## 2015-04-29 DIAGNOSIS — E041 Nontoxic single thyroid nodule: Secondary | ICD-10-CM | POA: Diagnosis not present

## 2015-04-29 DIAGNOSIS — K449 Diaphragmatic hernia without obstruction or gangrene: Secondary | ICD-10-CM | POA: Diagnosis not present

## 2015-04-29 DIAGNOSIS — R0789 Other chest pain: Secondary | ICD-10-CM | POA: Diagnosis not present

## 2015-04-29 DIAGNOSIS — M810 Age-related osteoporosis without current pathological fracture: Secondary | ICD-10-CM | POA: Diagnosis not present

## 2015-04-29 DIAGNOSIS — I272 Other secondary pulmonary hypertension: Secondary | ICD-10-CM | POA: Insufficient documentation

## 2015-04-29 DIAGNOSIS — J9811 Atelectasis: Secondary | ICD-10-CM | POA: Diagnosis not present

## 2015-04-29 LAB — URINALYSIS, ROUTINE W REFLEX MICROSCOPIC
Bilirubin Urine: NEGATIVE
GLUCOSE, UA: NEGATIVE mg/dL
HGB URINE DIPSTICK: NEGATIVE
Ketones, ur: NEGATIVE mg/dL
Leukocytes, UA: NEGATIVE
Nitrite: NEGATIVE
PH: 7 (ref 5.0–8.0)
Protein, ur: NEGATIVE mg/dL
SPECIFIC GRAVITY, URINE: 1.006 (ref 1.005–1.030)

## 2015-04-29 LAB — BASIC METABOLIC PANEL
Anion gap: 10 (ref 5–15)
BUN: 18 mg/dL (ref 6–20)
CHLORIDE: 100 mmol/L — AB (ref 101–111)
CO2: 27 mmol/L (ref 22–32)
CREATININE: 0.95 mg/dL (ref 0.44–1.00)
Calcium: 9 mg/dL (ref 8.9–10.3)
GFR calc Af Amer: >60 mL/min (ref >60)
GFR, EST NON AFRICAN AMERICAN: 52 mL/min — AB (ref >60)
Glucose, Bld: 113 mg/dL — ABNORMAL HIGH (ref 65–99)
Potassium: 4.1 mmol/L (ref 3.5–5.1)
SODIUM: 137 mmol/L (ref 135–145)

## 2015-04-29 LAB — CBC
HCT: 33.7 % — ABNORMAL LOW (ref 36.0–46.0)
Hemoglobin: 10.9 g/dL — ABNORMAL LOW (ref 12.0–15.0)
MCH: 27.5 pg (ref 26.0–34.0)
MCHC: 32.3 g/dL (ref 30.0–36.0)
MCV: 85.1 fL (ref 78.0–100.0)
Platelets: 237 K/uL (ref 150–400)
RBC: 3.96 MIL/uL (ref 3.87–5.11)
RDW: 14.4 % (ref 11.5–15.5)
WBC: 5.8 K/uL (ref 4.0–10.5)

## 2015-04-29 LAB — MAGNESIUM: Magnesium: 2.1 mg/dL (ref 1.7–2.4)

## 2015-04-29 LAB — PROTIME-INR
INR: 1.17 (ref 0.00–1.49)
PROTHROMBIN TIME: 15.1 s (ref 11.6–15.2)

## 2015-04-29 LAB — I-STAT TROPONIN, ED: Troponin i, poc: 0.01 ng/mL (ref 0.00–0.08)

## 2015-04-29 LAB — APTT: APTT: 35 s (ref 24–37)

## 2015-04-29 NOTE — ED Provider Notes (Signed)
CSN: [id number]     Arrival date & time 04/29/15  2010 History   First MD Initiated Contact with Patient 04/29/15 2026     Chief Complaint  Patient presents with  . Palpitations  . Chest Pain  . Dizziness   HPI Pt was walking after dinner and she started to feel lightheaded and her heart was racing.  She tried to continue to walk but the symptoms persisted.  She had to hold on to something to steady herself.  Her heart continues to race and 911 was called.  She was feeling short of breath and she felt a heaviness associated with the heart racing.  No history of heart disease. Past Medical History  Diagnosis Date  . Vitamin D deficiency 04/25/2011  . Acute upper respiratory infections of unspecified site 2013  . Loss of weight 04/25/2011  . Other drug allergy(995.27) 04/04/2011  . Cellulitis and abscess of hand, except fingers and thumb 10/04/2010  . Urinary tract infection, site not specified   . Pain in limb 2012  . Urticaria, unspecified 11/01/2008  . Myalgia and myositis, unspecified 01/07/2007  . Supraventricular premature beats 10/2006  . Hyperlipidemia 2007  . Hypertension 2007  . 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 Spinal stenosis, unspecified region other than cervical 2002  . Osteoarthrosis, unspecified whether generalized or localized, unspecified site 07/22/2000  . Nontoxic uninodular goiter 08/15/1998  . Diaphragmatic hernia without mention of obstruction or gangrene 08/15/1998  . Diverticulosis of colon (without mention of hemorrhage) 2000  . Degeneration of intervertebral disc, site unspecified 2000  . Osteoporosis 2000  . Personal history of fall 2012  . Sprain of ankle, left   . Impacted cerumen   . Urine frequency 01/20/2014  . Tricuspid regurgitation 07/25/14    Mild-moderate  . Pulmonary hypertension (HCC) 07/25/14    Mild-moderate   Past Surgical History  Procedure Laterality Date  . Laryngoscopy  1987    and biopsy  Dr. Haroldine Laws  .  Back surgery  1990    HNP L3-4 Dr. Lynnette Caffey  . Facial cosmetic surgery  1995    Dr. Charolotte Eke  . Colonoscopy  1992    acute segmental colitis Dr. Russella Dar  . Cataract extraction w/ intraocular lens implant Right 05/19/2014    Dr. Dione Booze   Family History  Problem Relation Age of Onset  . Stroke Mother   . Stroke Father   . Hypertension Brother   . Rheum arthritis Maternal Grandmother   . Pancreatic cancer Sister    Social History  Substance Use Topics  . Smoking status: Never Smoker   . Smokeless tobacco: Never Used  . Alcohol Use: No   OB History    No data available     Review of Systems  Constitutional: Negative for fever.  Respiratory: Negative for shortness of breath.   All other systems reviewed and are negative.     Allergies  Doxycycline; Lipitor; Pantoprazole; and Pepcid  Home Medications   Prior to Admission medications   Medication Sig Start Date End Date Taking? Authorizing Provider  amLODipine (NORVASC) 5 MG tablet TAKE 1 TABLET DAILY FOR BLOOD PRESSURE. 04/03/15  Yes Kimber Relic, MD  aspirin EC 81 MG tablet Take 81 mg by mouth at bedtime.   Yes Historical Provider, MD  cholecalciferol (VITAMIN D) 1000 UNITS tablet Take 1,000 Units by mouth daily.    Yes Historical Provider, MD  CRESTOR 10 MG tablet TAKE 1 TABLET DAILY TO LOWER CHOLESTEROL. Patient taking  differently: TAKE 1 TABLET DAILY AT BEDTIME TO LOWER CHOLESTEROL. 03/14/15  Yes Kimber Relic, MD  docusate sodium (COLACE) 100 MG capsule Take 100 mg by mouth 3 (three) times daily. 7:30am, 2pm, and bedtime -to prevent constipation   Yes Historical Provider, MD  HYDROcodone-acetaminophen (NORCO/VICODIN) 5-325 MG per tablet Take 1 tablet by mouth 3 (three) times daily. 7:30am, 2pm, and bedtime 06/17/12  Yes Historical Provider, MD  Magnesium 250 MG TABS Take 250 mg by mouth daily at 2 PM.    Yes Historical Provider, MD  Multiple Vitamin (MULTIVITAMIN WITH MINERALS) TABS tablet Take 1 tablet by mouth daily  at 2 PM.   Yes Historical Provider, MD  Multiple Vitamins-Minerals (PRESERVISION AREDS 2) CAPS Take 1 capsule by mouth 2 (two) times daily.   Yes Historical Provider, MD   BP 136/71 mmHg  Pulse 70  Temp(Src) 98.8 F (37.1 C) (Oral)  Resp 16  Ht 5\' 2"  (1.575 m)  Wt 47.174 kg  BMI 19.02 kg/m2  SpO2 99% Physical Exam  Constitutional: She appears well-developed and well-nourished. No distress.  HENT:  Head: Normocephalic and atraumatic.  Right Ear: External ear normal.  Left Ear: External ear normal.  Eyes: Conjunctivae are normal. Right eye exhibits no discharge. Left eye exhibits no discharge. No scleral icterus.  Neck: Neck supple. No tracheal deviation present.  Cardiovascular: Normal rate, regular rhythm and intact distal pulses.   Extrasystoles are present.  Pulmonary/Chest: Effort normal and breath sounds normal. No stridor. No respiratory distress. She has no wheezes. She has no rales.  Abdominal: Soft. Bowel sounds are normal. She exhibits no distension. There is no tenderness. There is no rebound and no guarding.  Musculoskeletal: She exhibits no edema or tenderness.  Neurological: She is alert. She has normal strength. No cranial nerve deficit (no facial droop, extraocular movements intact, no slurred speech) or sensory deficit. She exhibits normal muscle tone. She displays no seizure activity. Coordination normal.  Skin: Skin is warm and dry. No rash noted.  Psychiatric: She has a normal mood and affect.  Nursing note and vitals reviewed.   ED Course  Procedures (including critical care time) Labs Review Labs Reviewed  BASIC METABOLIC PANEL - Abnormal; Notable for the following:    Chloride 100 (*)    Glucose, Bld 113 (*)    GFR calc non Af Amer 52 (*)    All other components within normal limits  CBC - Abnormal; Notable for the following:    Hemoglobin 10.9 (*)    HCT 33.7 (*)    All other components within normal limits  APTT  PROTIME-INR  MAGNESIUM   URINALYSIS, ROUTINE W REFLEX MICROSCOPIC (NOT AT [facility])  I-STAT TROPOININ, ED  I-STAT TROPOININ, ED  [facility]TO [facility], ED    Imaging Review Dg Chest Portable 1 View  04/29/2015  CLINICAL DATA:  Heart palpitations, chest tightness, and shortness of breath starting today. EXAM: PORTABLE CHEST 1 VIEW COMPARISON:  01/12/2015 FINDINGS: Normal heart size and pulmonary vascularity. Mild linear atelectasis in the left lung base. No focal consolidation or airspace disease in the lungs. Calcified and tortuous aorta. No pneumothorax. Thoracolumbar scoliosis convex towards the right. IMPRESSION: Linear atelectasis in the lung bases. No evidence of active pulmonary disease. Electronically Signed   By: 13/05/2014 M.D.   On: 04/29/2015 21:24   I have personally reviewed and evaluated these images and lab results as part of my medical decision-making.   EKG Interpretation   Date/Time:  Saturday April 29 2015 [birth year]1992  EST Ventricular Rate:  96 PR Interval:  162 QRS Duration: 93 QT Interval:  388 QTC Calculation: 490 R Axis:   49 Text Interpretation:  Sinus rhythm Multiple premature complexes, vent &  supraven , new since last tracing Low voltage, precordial leads Minimal ST  depression, lateral leads Baseline wander in lead(s) I III aVL Confirmed  by Broadus Costilla  MD-J, Dajae Kizer ([id number]) on 04/29/2015 8:34:28 PM      MDM   Final diagnoses:  Palpitations  Chest pain, unspecified chest pain type  Near syncope    Pt has been monitored in the ED.  No further tachycardia or recurrent chest pain.  Reviewed EKG with Dr Sullivan Lone, cardiology.  Rhythm strips showed PACS but no def a fib or SVT.  Pt is concerned about going home this evening.  She felt chest discomfort and thought she was going to pass out. I would not think the PACs alone would make her near syncopal. Will consult with medical service for possible observation, cardiac monitoring, serial enzymes.   Linwood Dibbles, MD 04/29/15 [phone]

## 2015-04-29 NOTE — ED Notes (Signed)
[facility].  Reports walking back to apartment and started feeling dizzy and heart racing.  On arrival EMS reports Irregular heart rate bouncing between 80's-160's.  Given Asa 324mg  and NTG sl X 1.  Also reports pressure in left chest rated at 6/10 with no relief from ntg.  No cardiac history.

## 2015-04-30 DIAGNOSIS — E785 Hyperlipidemia, unspecified: Secondary | ICD-10-CM | POA: Diagnosis not present

## 2015-04-30 DIAGNOSIS — R55 Syncope and collapse: Secondary | ICD-10-CM | POA: Diagnosis present

## 2015-04-30 DIAGNOSIS — R079 Chest pain, unspecified: Secondary | ICD-10-CM

## 2015-04-30 DIAGNOSIS — R002 Palpitations: Secondary | ICD-10-CM

## 2015-04-30 DIAGNOSIS — I10 Essential (primary) hypertension: Secondary | ICD-10-CM | POA: Diagnosis not present

## 2015-04-30 DIAGNOSIS — E041 Nontoxic single thyroid nodule: Secondary | ICD-10-CM

## 2015-04-30 LAB — CBC
HEMATOCRIT: 33.6 % — AB (ref 36.0–46.0)
Hemoglobin: 11.1 g/dL — ABNORMAL LOW (ref 12.0–15.0)
MCH: 28 pg (ref 26.0–34.0)
MCHC: 33 g/dL (ref 30.0–36.0)
MCV: 84.8 fL (ref 78.0–100.0)
PLATELETS: 244 10*3/uL (ref 150–400)
RBC: 3.96 MIL/uL (ref 3.87–5.11)
RDW: 14.4 % (ref 11.5–15.5)
WBC: 6.9 10*3/uL (ref 4.0–10.5)

## 2015-04-30 LAB — I-STAT TROPONIN, ED: Troponin i, poc: 0.01 ng/mL (ref 0.00–0.08)

## 2015-04-30 LAB — BASIC METABOLIC PANEL
Anion gap: 7 (ref 5–15)
BUN: 17 mg/dL (ref 6–20)
CHLORIDE: 106 mmol/L (ref 101–111)
CO2: 26 mmol/L (ref 22–32)
CREATININE: 0.84 mg/dL (ref 0.44–1.00)
Calcium: 9 mg/dL (ref 8.9–10.3)
GFR calc Af Amer: >60 mL/min (ref >60)
GFR calc non Af Amer: >60 mL/min (ref >60)
GLUCOSE: 96 mg/dL (ref 65–99)
POTASSIUM: 4.2 mmol/L (ref 3.5–5.1)
SODIUM: 139 mmol/L (ref 135–145)

## 2015-04-30 LAB — TROPONIN I: Troponin I: <0.03 ng/mL (ref <0.031)

## 2015-04-30 LAB — TSH: TSH: 1.047 u[IU]/mL (ref 0.350–4.500)

## 2015-04-30 MED ORDER — ONDANSETRON HCL 4 MG PO TABS: 4.0000 mg | ORAL_TABLET | Freq: Four times a day (QID) | ORAL | Status: DC | PRN

## 2015-04-30 MED ORDER — SODIUM CHLORIDE 0.9 % IV SOLN
INTRAVENOUS | Status: DC
  Administered 2015-04-30 – 2015-05-01 (×3): via INTRAVENOUS

## 2015-04-30 MED ORDER — ADULT MULTIVITAMIN W/MINERALS CH
1.0000 | ORAL_TABLET | Freq: Every day | ORAL | Status: DC
  Administered 2015-04-30 – 2015-05-01 (×2): 1 via ORAL
  Filled 2015-04-30 (×2): qty 1

## 2015-04-30 MED ORDER — MAGNESIUM OXIDE 400 (241.3 MG) MG PO TABS
400.0000 mg | ORAL_TABLET | Freq: Every day | ORAL | Status: DC
  Administered 2015-04-30 – 2015-05-01 (×2): 400 mg via ORAL
  Filled 2015-04-30 (×3): qty 1

## 2015-04-30 MED ORDER — ALUM & MAG HYDROXIDE-SIMETH 200-200-20 MG/5ML PO SUSP: 30.0000 mL | Freq: Four times a day (QID) | ORAL | Status: DC | PRN

## 2015-04-30 MED ORDER — OXYCODONE HCL 5 MG PO TABS: 5.0000 mg | ORAL_TABLET | ORAL | Status: DC | PRN

## 2015-04-30 MED ORDER — DOCUSATE SODIUM 100 MG PO CAPS
100.0000 mg | ORAL_CAPSULE | Freq: Three times a day (TID) | ORAL | Status: DC
  Administered 2015-04-30 – 2015-05-01 (×5): 100 mg via ORAL
  Filled 2015-04-30 (×6): qty 1

## 2015-04-30 MED ORDER — SODIUM CHLORIDE 0.9% FLUSH: 3.0000 mL | Freq: Two times a day (BID) | INTRAVENOUS | Status: DC

## 2015-04-30 MED ORDER — VITAMIN D 1000 UNITS PO TABS
1000.0000 [IU] | ORAL_TABLET | Freq: Every day | ORAL | Status: DC
  Administered 2015-04-30 – 2015-05-01 (×2): 1000 [IU] via ORAL
  Filled 2015-04-30 (×2): qty 1

## 2015-04-30 MED ORDER — ENOXAPARIN SODIUM 30 MG/0.3ML ~~LOC~~ SOLN
30.0000 mg | Freq: Every day | SUBCUTANEOUS | Status: DC
  Administered 2015-04-30 – 2015-05-01 (×2): 30 mg via SUBCUTANEOUS
  Filled 2015-04-30 (×3): qty 0.3

## 2015-04-30 MED ORDER — OCUVITE-LUTEIN PO CAPS
1.0000 | ORAL_CAPSULE | Freq: Every day | ORAL | Status: DC
  Administered 2015-04-30 – 2015-05-01 (×2): 1 via ORAL
  Filled 2015-04-30 (×2): qty 1

## 2015-04-30 MED ORDER — HYDROMORPHONE HCL 1 MG/ML IJ SOLN: 0.5000 mg | INTRAMUSCULAR | Status: DC | PRN

## 2015-04-30 MED ORDER — AMLODIPINE BESYLATE 5 MG PO TABS
5.0000 mg | ORAL_TABLET | Freq: Every day | ORAL | Status: DC
  Administered 2015-04-30 – 2015-05-01 (×2): 5 mg via ORAL
  Filled 2015-04-30 (×2): qty 1

## 2015-04-30 MED ORDER — HYDROCODONE-ACETAMINOPHEN 5-325 MG PO TABS
1.0000 | ORAL_TABLET | Freq: Four times a day (QID) | ORAL | Status: DC | PRN
  Administered 2015-04-30: 1 via ORAL
  Filled 2015-04-30: qty 1

## 2015-04-30 MED ORDER — ACETAMINOPHEN 325 MG PO TABS
650.0000 mg | ORAL_TABLET | Freq: Four times a day (QID) | ORAL | Status: DC | PRN
  Administered 2015-05-01: 650 mg via ORAL
  Filled 2015-04-30: qty 2

## 2015-04-30 MED ORDER — ACETAMINOPHEN 650 MG RE SUPP
650.0000 mg | Freq: Four times a day (QID) | RECTAL | Status: DC | PRN
Start: 2015-04-30 — End: 2015-05-01

## 2015-04-30 MED ORDER — ONDANSETRON HCL 4 MG/2ML IJ SOLN: 4.0000 mg | Freq: Four times a day (QID) | INTRAMUSCULAR | Status: DC | PRN

## 2015-04-30 MED ORDER — ROSUVASTATIN CALCIUM 10 MG PO TABS
10.0000 mg | ORAL_TABLET | Freq: Every day | ORAL | Status: DC
  Administered 2015-04-30 – 2015-05-01 (×2): 10 mg via ORAL
  Filled 2015-04-30 (×3): qty 1

## 2015-04-30 NOTE — ED Notes (Signed)
Attempted to call report-no answer 

## 2015-04-30 NOTE — ED Notes (Signed)
No answer when called to give report.  ED charge nurse aware

## 2015-04-30 NOTE — Progress Notes (Signed)
[facility] PROGRESS NOTE  [name] VOJ:[id number] DOB: [birth year] DOA: 04/29/2015 PCP: Kimber Relic, MD HPI/Subjective: 82 y.o. WF PMHx HTN , Hyperlipidemia, Pulmonary HTN, Previous SVT, TV regurgitation, nontoxic uninodular goiter, diaphragmatic hernia, frequent falls,  Brought to the ED via EMS from [facility] after she had complaints of palpitations and felt as if she would faint. This occurred after her evening meal, and she said that she was walking down the hall and had to hold onto the rails because of feeling Faint. She denies any chest pain or pressure or SOB. Per EMS her heart rate was ranging form the 80's to 160 on route. She was evaluated in the ED, and while in the ED she had no further episodes. She was referred for observation  HPI/Subjective: 2/19  A/O 4, NAD. States has never had previous episode of arrhythmia. Describes pain as heart beating extremely fast, positive lightheadedness, positive fatigue. Was able to grab onto a bar and sit down. States HR~160 when checked by EMT.  Assessment/Plan: Near syncope -By patient's description most likely A. fib with RVR, however will check for  MI -Troponin 2 negative  -Check Orhostatic Vitals -Echocardiogram pending  A. fib with RVR -Cardiac Monitoring -TSH Level WNL -Magnesium and potassium WNL  Hypertension -Amlodipine 5 mg daily  Nontoxic uninodular goiter -TSH WNL  Hyperlipidemia -Continue Crestor 10 mg daily   Code Status: Full Family Communication: Son and daughter-in-law present Disposition Plan: [facility] back to [facility] upon completion of workup   Consultants: NA  Procedures: Echocardiogram pending  Cultures NA  Antibiotics: NA  DVT prophylaxis Lovenox    Objective: Filed Vitals:   04/30/15 0400 04/30/15 0500 04/30/15 0600 04/30/15 0710  BP: 113/57 132/67 128/64 170/75  Pulse: 67 74 69 76  Temp:    98.1 F  (36.7 C)  TempSrc:    Oral  Resp: 13 11 13 16   Height:    5\' 2"  (1.575 m)  Weight:    47.31 kg (104 lb 4.8 oz)  SpO2: 96% 96% 96% 99%    Intake/Output Summary (Last 24 hours) at 04/30/15 0727 Last data filed at 04/30/15 0149  Gross per 24 hour  Intake      0 ml  Output   1400 ml  Net  -1400 ml   Filed Weights   04/29/15 2020 04/30/15 0710  Weight: 47.174 kg (104 lb) 47.31 kg (104 lb 4.8 oz)     Exam: General:A/O 4, NAD. No acute respiratory distress Eyes: Negative headache, eye pain, double vision,negative scleral hemorrhage ENT: Negative Runny nose, negative ear pain, negative tinnitus, negative gingival bleeding, Neck:  Negative scars, masses, torticollis, lymphadenopathy, JVD Lungs: Clear to auscultation bilaterally without wheezes or crackles Cardiovascular: Regular rate and rhythm without murmur gallop or rub normal S1 and S2 Abdomen:negative abdominal pain, negative dysphagia, nondistended, positive soft, bowel sounds, no rebound, no ascites, no appreciable mass Extremities: No significant cyanosis, clubbing, or edema bilateral lower extremities Psychiatric:  Negative depression, negative anxiety, negative fatigue, negative mania  Neurologic:  Cranial nerves II through XII intact, tongue/uvula midline, all extremities muscle strength 5/5, sensation intact throughout, negative dysarthria, negative expressive aphasia, negative receptive aphasia.     Data Reviewed: Basic Metabolic Panel:  Recent Labs Lab 04/29/15 2130 04/30/15 0447  NA 137 139  K 4.1 4.2  CL 100* 106  CO2 27 26  GLUCOSE 113* 96  BUN 18 17  CREATININE 0.95 0.84  CALCIUM 9.0 9.0  MG 2.1  --    Liver Function Tests: No results for input(s): AST, ALT, ALKPHOS, BILITOT, PROT, ALBUMIN in the last 168 hours. No results for input(s): LIPASE, AMYLASE in the last 168 hours. No results for input(s): AMMONIA in the last 168 hours. CBC:  Recent Labs Lab 04/29/15 2130 04/30/15 0447  WBC 5.8 6.9   HGB 10.9* 11.1*  HCT 33.7* 33.6*  MCV 85.1 84.8  PLT 237 244   Cardiac Enzymes:  Recent Labs Lab 04/30/15 0447  TROPONINI <0.03   BNP (last 3 results) No results for input(s): BNP in the last 8760 hours.  ProBNP (last 3 results)  Recent Labs  08/31/14 1545  PROBNP 141.0*    CBG: No results for input(s): GLUCAP in the last 168 hours.  No results found for this or any previous visit (from the past 240 hour(s)).   Studies: Dg Chest Portable 1 View  04/29/2015  CLINICAL DATA:  Heart palpitations, chest tightness, and shortness of breath starting today. EXAM: PORTABLE CHEST 1 VIEW COMPARISON:  01/12/2015 FINDINGS: Normal heart size and pulmonary vascularity. Mild linear atelectasis in the left lung base. No focal consolidation or airspace disease in the lungs. Calcified and tortuous aorta. No pneumothorax. Thoracolumbar scoliosis convex towards the right. IMPRESSION: Linear atelectasis in the lung bases. No evidence of active pulmonary disease. Electronically Signed   By: Burman Nieves M.D.   On: 04/29/2015 21:24    Scheduled Meds: . amLODipine  5 mg Oral Daily  . cholecalciferol  1,000 Units Oral Daily  . docusate sodium  100 mg Oral TID  . enoxaparin (LOVENOX) injection  30 mg Subcutaneous Daily  . magnesium oxide  400 mg Oral Q1400  . multivitamin with minerals  1 tablet Oral Q1400  . multivitamin-lutein  1 capsule Oral Daily  . rosuvastatin  10 mg Oral Daily  . sodium chloride flush  3 mL Intravenous Q12H   Continuous Infusions: . sodium chloride 50 mL/hr at 04/30/15 0534    Principal Problem:   Near syncope Active Problems:   Hypertension   Nontoxic uninodular goiter   Hyperlipidemia   Palpitations    Time spent: 35 minutes   WOODS, CURTIS J  [facility] Pager [phone]. If 7PM-7AM, please contact night-coverage at [email or web address], password [facility] 04/30/2015, 7:27 AM     Care during the described time interval was provided by me .  I have  reviewed this patient's available data, including medical history, events of note, physical examination, and all test results as part of my evaluation. I have personally reviewed and interpreted all radiology studies.   Carolyne Littles, MD [phone] Pager

## 2015-04-30 NOTE — H&P (Addendum)
[facility] Admission History and Physical       [name] IWL:[id number] DOB: 11/30/1924 DOA: 04/29/2015  Referring physician:  EDP PCP: Kimber Relic, MD  Specialists:   Chief Complaint: Palpitations  HPI: [name] is a 82 y.o. female with a history of HTN , Hyperlipidemia, Pulmonary HTN, and Previous SVT who was brought to the ED via EMS from [facility] after she had complaints of palpitations and felt as if she would faint.  This occurred after her evening meal, and she said that she was walking down the hall and had to hold onto the rails because of feeling  Faint.   She denies any chest pain or pressure or SOB.   Per EMS her heart rate was ranging form the 80's to 160 on route.   She was evaluated in the ED, and while in the ED she had no further episodes.  She was referred for observation  Review of Systems:    Constitutional: No Weight Loss, No Weight Gain, Night Sweats, Fevers, Chills, Dizziness, +Light Headedness, Fatigue, or Generalized Weakness HEENT: No Headaches, Difficulty Swallowing,Tooth/Dental Problems,Sore Throat,  No Sneezing, Rhinitis, Ear Ache, Nasal Congestion, or Post Nasal Drip,  Cardio-vascular:  No Chest pain, Orthopnea, PND, Edema in Lower Extremities, Anasarca, Dizziness, +Palpitations  Resp: No Dyspnea, No DOE, No Productive Cough, No Non-Productive Cough, No Hemoptysis, No Wheezing.    GI: No Heartburn, Indigestion, Abdominal Pain, Nausea, Vomiting, Diarrhea, Constipation, Hematemesis, Hematochezia, Melena, Change in Bowel Habits,  Loss of Appetite  GU: No Dysuria, No Change in Color of Urine, No Urgency or Urinary Frequency, No Flank pain.  Musculoskeletal: No Joint Pain or Swelling, No Decreased Range of Motion, No Back Pain.  Neurologic: No Syncope, No Seizures, Muscle Weakness, Paresthesia, Vision Disturbance or Loss, No Diplopia, No Vertigo, No Difficulty Walking,  Skin: No Rash or Lesions. Psych: No  Change in Mood or Affect, No Depression or Anxiety, No Memory loss, No Confusion, or Hallucinations   Past Medical History  Diagnosis Date  . Vitamin D deficiency 04/25/2011  . Acute upper respiratory infections of unspecified site 2013  . Loss of weight 04/25/2011  . Other drug allergy(995.27) 04/04/2011  . Cellulitis and abscess of hand, except fingers and thumb 10/04/2010  . Urinary tract infection, site not specified   . Pain in limb 2012  . Urticaria, unspecified 11/01/2008  . Myalgia and myositis, unspecified 01/07/2007  . Supraventricular premature beats 10/2006  . Hyperlipidemia 2007  . Hypertension 2007  . 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 Spinal stenosis, unspecified region other than cervical 2002  . Osteoarthrosis, unspecified whether generalized or localized, unspecified site 07/22/2000  . Nontoxic uninodular goiter 08/15/1998  . Diaphragmatic hernia without mention of obstruction or gangrene 08/15/1998  . Diverticulosis of colon (without mention of hemorrhage) 2000  . Degeneration of intervertebral disc, site unspecified 2000  . Osteoporosis 2000  . Personal history of fall 2012  . Sprain of ankle, left   . Impacted cerumen   . Urine frequency 01/20/2014  . Tricuspid regurgitation 07/25/14    Mild-moderate  . Pulmonary hypertension (HCC) 07/25/14    Mild-moderate     Past Surgical History  Procedure Laterality Date  . Laryngoscopy  1987    and biopsy  Dr. Haroldine Laws  . Back surgery  1990    HNP L3-4 Dr. Lynnette Caffey  . Facial cosmetic surgery  1995    Dr. Charolotte Eke  . Colonoscopy  1992  acute segmental colitis Dr. Russella Dar  . Cataract extraction w/ intraocular lens implant Right 05/19/2014    Dr. Dione Booze      Prior to Admission medications   Medication Sig Start Date End Date Taking? Authorizing Provider  amLODipine (NORVASC) 5 MG tablet TAKE 1 TABLET DAILY FOR BLOOD PRESSURE. 04/03/15  Yes Kimber Relic, MD  aspirin EC 81 MG tablet  Take 81 mg by mouth at bedtime.   Yes Historical Provider, MD  cholecalciferol (VITAMIN D) 1000 UNITS tablet Take 1,000 Units by mouth daily.    Yes Historical Provider, MD  CRESTOR 10 MG tablet TAKE 1 TABLET DAILY TO LOWER CHOLESTEROL. Patient taking differently: TAKE 1 TABLET DAILY AT BEDTIME TO LOWER CHOLESTEROL. 03/14/15  Yes Kimber Relic, MD  docusate sodium (COLACE) 100 MG capsule Take 100 mg by mouth 3 (three) times daily. 7:30am, 2pm, and bedtime -to prevent constipation   Yes Historical Provider, MD  HYDROcodone-acetaminophen (NORCO/VICODIN) 5-325 MG per tablet Take 1 tablet by mouth 3 (three) times daily. 7:30am, 2pm, and bedtime 06/17/12  Yes Historical Provider, MD  Magnesium 250 MG TABS Take 250 mg by mouth daily at 2 PM.    Yes Historical Provider, MD  Multiple Vitamin (MULTIVITAMIN WITH MINERALS) TABS tablet Take 1 tablet by mouth daily at 2 PM.   Yes Historical Provider, MD  Multiple Vitamins-Minerals (PRESERVISION AREDS 2) CAPS Take 1 capsule by mouth 2 (two) times daily.   Yes Historical Provider, MD     Allergies  Allergen Reactions  . Doxycycline Itching  . Lipitor [Atorvastatin] Other (See Comments)    PAIN IN LEGS  . Pantoprazole Itching  . Pepcid [Famotidine] Itching    Social History:  Lives in Independent Living, Able to perform all of her ADLs.    reports that she has never smoked. She has never used smokeless tobacco. She reports that she does not drink alcohol or use illicit drugs.    Family History  Problem Relation Age of Onset  . Stroke Mother   . Stroke Father   . Hypertension Brother   . Rheum arthritis Maternal Grandmother   . Pancreatic cancer Sister        Physical Exam:  GEN:  Pleasant Thin Elderly 82 y.o. Caucasian female examined and in no acute distress; cooperative with exam Filed Vitals:   04/29/15 2230 04/29/15 2245 04/29/15 2300 04/29/15 2315  BP: 119/72 129/61 127/64 136/71  Pulse: 67 62 73 70  Temp:      TempSrc:      Resp: 14 13  14 16   Height:      Weight:      SpO2: 98% 97% 98% 99%   Blood pressure 136/71, pulse 70, temperature 98.8 F (37.1 C), temperature source Oral, resp. rate 16, height 5\' 2"  (1.575 m), weight 47.174 kg (104 lb), SpO2 99 %. PSYCH: She is alert and oriented x4; does not appear anxious does not appear depressed; affect is normal HEENT: Normocephalic and Atraumatic, Mucous membranes pink; PERRLA; EOM intact; Fundi:  Benign;  No scleral icterus, Nares: Patent, Oropharynx: Clear, Fair Dentition,    Neck:  FROM, No Cervical Lymphadenopathy nor Thyromegaly or Carotid Bruit; No JVD; Breasts:: Not examined CHEST WALL: No tenderness CHEST: Normal respiration, clear to auscultation bilaterally HEART: Regular rate and rhythm; no murmurs rubs or gallops BACK: No kyphosis or scoliosis; No CVA tenderness ABDOMEN: Positive Bowel Sounds, Scaphoid, Soft Non-Tender, No Rebound or Guarding; No Masses, No Organomegaly Rectal Exam: Not done EXTREMITIES: No Cyanosis, Clubbing,  or Edema; No Ulcerations. Genitalia: not examined PULSES: 2+ and symmetric SKIN: Normal hydration no rash or ulceration CNS:  Alert and Oriented x 4, No Focal Deficits Vascular: pulses palpable throughout    Labs on Admission:  Basic Metabolic Panel:  Recent Labs Lab 04/29/15 2130  NA 137  K 4.1  CL 100*  CO2 27  GLUCOSE 113*  BUN 18  CREATININE 0.95  CALCIUM 9.0  MG 2.1   Liver Function Tests: No results for input(s): AST, ALT, ALKPHOS, BILITOT, PROT, ALBUMIN in the last 168 hours. No results for input(s): LIPASE, AMYLASE in the last 168 hours. No results for input(s): AMMONIA in the last 168 hours. CBC:  Recent Labs Lab 04/29/15 2130  WBC 5.8  HGB 10.9*  HCT 33.7*  MCV 85.1  PLT 237   Cardiac Enzymes: No results for input(s): CKTOTAL, CKMB, CKMBINDEX, TROPONINI in the last 168 hours.  BNP (last 3 results) No results for input(s): BNP in the last 8760 hours.  ProBNP (last 3 results)  Recent Labs   08/31/14 1545  PROBNP 141.0*    CBG: No results for input(s): GLUCAP in the last 168 hours.  Radiological Exams on Admission: Dg Chest Portable 1 View  04/29/2015  CLINICAL DATA:  Heart palpitations, chest tightness, and shortness of breath starting today. EXAM: PORTABLE CHEST 1 VIEW COMPARISON:  01/12/2015 FINDINGS: Normal heart size and pulmonary vascularity. Mild linear atelectasis in the left lung base. No focal consolidation or airspace disease in the lungs. Calcified and tortuous aorta. No pneumothorax. Thoracolumbar scoliosis convex towards the right. IMPRESSION: Linear atelectasis in the lung bases. No evidence of active pulmonary disease. Electronically Signed   By: Burman Nieves M.D.   On: 04/29/2015 21:24     EKG: Independently reviewed. Normal Sinus Rhythm  Rate = 96 with PACs    ( EKG reviewed by Cards)   Assessment/Plan:   82 y.o. female with  Principal Problem:    1.    Near syncope    Cardiac Monitoring    Check Orhostatic Vitals   Active Problems:    2.    Palpitations    Cardiac Monitoring    Check TSH Level        3.    Hypertension    Continue Amlodipine    Monitor BPs      4.    Nontoxic uninodular goiter    Check TSH Level      5.    Hyperlipidemia    Continue Crestor Rx      6.    DVT Prophylaxis    Lovenox        Code Status:     FULL CODE     Family Communication:   No Family Present    Disposition Plan:    Observation Status        Time spent: 3 Minutes      Ron Parker [facility] Pager [phone]   If 7AM -7PM Please Contact the Day Rounding Team MD for [facility]  If 7PM-7AM, Please Contact Night-Floor Coverage  [email or web address] Password [email or web address] 04/30/2015, 1:05 AM     ADDENDUM:   Patient was seen and examined on 04/30/2015

## 2015-05-01 ENCOUNTER — Observation Stay (HOSPITAL_BASED_OUTPATIENT_CLINIC_OR_DEPARTMENT_OTHER): Payer: Medicare Other

## 2015-05-01 DIAGNOSIS — R002 Palpitations: Secondary | ICD-10-CM | POA: Diagnosis not present

## 2015-05-01 DIAGNOSIS — I10 Essential (primary) hypertension: Secondary | ICD-10-CM | POA: Diagnosis not present

## 2015-05-01 DIAGNOSIS — E785 Hyperlipidemia, unspecified: Secondary | ICD-10-CM | POA: Diagnosis not present

## 2015-05-01 DIAGNOSIS — R55 Syncope and collapse: Secondary | ICD-10-CM

## 2015-05-01 MED ORDER — METOPROLOL TARTRATE 12.5 MG HALF TABLET: 12.5000 mg | ORAL_TABLET | Freq: Two times a day (BID) | ORAL | Status: DC

## 2015-05-01 NOTE — Progress Notes (Signed)
  Echocardiogram 2D Echocardiogram has been performed.  Delcie Roch 05/01/2015, 11:07 AM

## 2015-05-01 NOTE — Discharge Summary (Signed)
Physician Discharge Summary  RICA HEATHER ZTI:[id number] DOB: [birth year] DOA: 04/29/2015  PCP: Kimber Relic, MD  Admit date: 04/29/2015 Discharge date: 05/01/2015  Recommendations for Outpatient Follow-up:  1. F/u with cardiology ASAP for possible outpatient monitoring for arrhythmia  Discharge Diagnoses:  Principal Problem:   Near syncope Active Problems:   Hypertension   Nontoxic uninodular goiter   Hyperlipidemia   Palpitations   Discharge Condition: stable, improved  Diet recommendation: healthy heart  Wt Readings from Last 3 Encounters:  05/01/15 47.356 kg (104 lb 6.4 oz)  03/02/15 48.081 kg (106 lb)  12/29/14 48.081 kg (106 lb)    History of present illness:  82 y.o. WF PMHx HTN , Hyperlipidemia, Pulmonary HTN, Previous SVT, TV regurgitation, nontoxic uninodular goiter, diaphragmatic hernia, frequent falls,  Brought to the ED via EMS from [facility] after she had complaint of palpitations and felt as if she would faint. This occurred after her evening meal, and she said that she was walking down the hall and had to hold onto the rails because of feelingFaint. She denied chest pain or pressure or SOB. Per EMS her heart rate was ranging form the 80's to 160 on route. She was evaluated in the ED, and while in the ED she had no further episodes. She was referred for observation  Hospital Course:   Near syncope, orthostatics negative.  Telemetry demonstrated NSR with occasional sinus tachycardia to the 120s, but otherwise normal rate.  Troponins were negative.  ECHO demonstrated normal systolic function with ejection fraction of 55-60%, no regional wall motion abnormalities, grade 1 diastolic dysfunction. She had mild mitral valve regurgitation and mild tricuspid valve regurgitation.  Her story suggests a-fib with RVR or other tachyarrhythmia which we have not captured, however, her telemetry strips from EMS demonstrated only premature  atrial contractions.  Recommend outpatient cardiology follow up.  Her TSH, magnesium, and potassium were wnl.  No evidence of underlying infection.    Hypertension - consider changing norvasc 5 mg daily to metoprolol 12.5mg  BID if palpitations become recurrent  Nontoxic uninodular goiter -TSH WNL  Hyperlipidemia -Continued Crestor 10 mg daily  Procedures:  CXR  ECHO  Consultations:  none  Discharge Exam: Filed Vitals:   05/01/15 1349 05/01/15 1434  BP: 113/47 121/51  Pulse: 72 77  Temp: 97.2 F (36.2 C)   Resp:     Filed Vitals:   05/01/15 0901 05/01/15 1203 05/01/15 1349 05/01/15 1434  BP: 120/59 126/62 113/47 121/51  Pulse: 84 71 72 77  Temp: 98.1 F (36.7 C) 98 F (36.7 C) 97.2 F (36.2 C)   TempSrc: Oral Oral Oral   Resp: 16 16    Height:      Weight:      SpO2: 97% 100%      General: adult female, NAD Cardiovascular: RRR, no obvious murmurs Respiratory: CTAB, no increased WOB ABD:  NABS, soft, ND/NT Neuro:  Grossly intact, no facial droop  Discharge Instructions      Discharge Instructions    Call MD for:  difficulty breathing, headache or visual disturbances    Complete by:  As directed      Call MD for:  extreme fatigue    Complete by:  As directed      Call MD for:  hives    Complete by:  As directed      Call MD for:  persistant dizziness or light-headedness    Complete by:  As directed  Call MD for:  persistant nausea and vomiting    Complete by:  As directed      Call MD for:  severe uncontrolled pain    Complete by:  As directed      Call MD for:  temperature >100.4    Complete by:  As directed      Diet - low sodium heart healthy    Complete by:  As directed      Driving Restrictions    Complete by:  As directed   No driving or operating heavy machinery     Increase activity slowly    Complete by:  As directed             Medication List    TAKE these medications        amLODipine 5 MG tablet  Commonly known as:   NORVASC  TAKE 1 TABLET DAILY FOR BLOOD PRESSURE.     aspirin EC 81 MG tablet  Take 81 mg by mouth at bedtime.     cholecalciferol 1000 units tablet  Commonly known as:  VITAMIN D  Take 1,000 Units by mouth daily.     CRESTOR 10 MG tablet  Generic drug:  rosuvastatin  TAKE 1 TABLET DAILY TO LOWER CHOLESTEROL.     docusate sodium 100 MG capsule  Commonly known as:  COLACE  Take 100 mg by mouth 3 (three) times daily. 7:30am, 2pm, and bedtime -to prevent constipation     HYDROcodone-acetaminophen 5-325 MG tablet  Commonly known as:  NORCO/VICODIN  Take 1 tablet by mouth 3 (three) times daily. 7:30am, 2pm, and bedtime     Magnesium 250 MG Tabs  Take 250 mg by mouth daily at 2 PM.     multivitamin with minerals Tabs tablet  Take 1 tablet by mouth daily at 2 PM.     PRESERVISION AREDS 2 Caps  Take 1 capsule by mouth 2 (two) times daily.       Follow-up Information    Schedule an appointment as soon as possible for a visit with GREEN, Lenon Curt, MD.   Specialty:  Internal Medicine   Why:  As needed   Contact information:   [address] Jeanella [place] [address] [phone]       Follow up with [facility]. Schedule an appointment as soon as possible for a visit in 2 weeks.   Contact information:   [address] [place] [address] [phone]       The results of significant diagnostics from this hospitalization (including imaging, microbiology, ancillary and laboratory) are listed below for reference.    Significant Diagnostic Studies: Dg Chest Portable 1 View  04/29/2015  CLINICAL DATA:  Heart palpitations, chest tightness, and shortness of breath starting today. EXAM: PORTABLE CHEST 1 VIEW COMPARISON:  01/12/2015 FINDINGS: Normal heart size and pulmonary vascularity. Mild linear atelectasis in the left lung base. No focal consolidation or airspace disease in the lungs. Calcified and  tortuous aorta. No pneumothorax. Thoracolumbar scoliosis convex towards the right. IMPRESSION: Linear atelectasis in the lung bases. No evidence of active pulmonary disease. Electronically Signed   By: Burman Nieves M.D.   On: 04/29/2015 21:24    Microbiology: No results found for this or any previous visit (from the past 240 hour(s)).   Labs: Basic Metabolic Panel:  Recent Labs Lab 04/29/15 2130 04/30/15 0447  NA 137 139  K 4.1 4.2  CL 100* 106  CO2 27  26  GLUCOSE 113* 96  BUN 18 17  CREATININE 0.95 0.84  CALCIUM 9.0 9.0  MG 2.1  --    Liver Function Tests: No results for input(s): AST, ALT, ALKPHOS, BILITOT, PROT, ALBUMIN in the last 168 hours. No results for input(s): LIPASE, AMYLASE in the last 168 hours. No results for input(s): AMMONIA in the last 168 hours. CBC:  Recent Labs Lab 04/29/15 2130 04/30/15 0447  WBC 5.8 6.9  HGB 10.9* 11.1*  HCT 33.7* 33.6*  MCV 85.1 84.8  PLT 237 244   Cardiac Enzymes:  Recent Labs Lab 04/30/15 0447 04/30/15 0858 04/30/15 1446  TROPONINI <0.03 <0.03 <0.03   BNP: BNP (last 3 results) No results for input(s): BNP in the last 8760 hours.  ProBNP (last 3 results)  Recent Labs  08/31/14 1545  PROBNP 141.0*    CBG: No results for input(s): GLUCAP in the last 168 hours.  Time coordinating discharge: 35 minutes  Signed:  Lexine Jaspers  [facility] 05/01/2015, 2:45 PM

## 2015-05-01 NOTE — Care Management Note (Signed)
Case Management Note  Patient Details  Name: FRIDA WAHLSTROM MRN: [id number] Date of Birth: [birth year]  Subjective/Objective:   Admitted with Near syncope                 Action/Plan: Patient lives at [facility] at [place]; CM talked to patient about HHC choices, patient requested the physical therapy located at [facility]; CM left a voice mail for them to return call to arrange [facility] as requested;  Expected Discharge Date:    05/01/2015              Expected Discharge Plan:  Home w Home Health Services  Discharge planning Services  CM Consult Choice offered to:  Patient  HH Arranged:  PT [facility] Agency:   [facility]  Status of Service:  Completed, signed off   Reola Mosher [phone] 05/01/2015, 3:03 PM

## 2015-05-01 NOTE — Evaluation (Signed)
Physical Therapy Evaluation Patient Details Name: MARIADELOSANG WYNNS MRN: [id number] DOB: 1924-06-29 Today's Date: 05/01/2015   History of Present Illness  82 y.o. WF PMHx HTN , Hyperlipidemia, Pulmonary HTN, Previous SVT, TV regurgitation, nontoxic uninodular goiter, diaphragmatic hernia, frequent falls presents to [facility] for complaints of heart palpitations and near syncope.  Clinical Impression  Patient demonstrates deficits in functional mobility as indicated below. Will need continued skilled PT to address deficits and maximize function. Will see as indicated and progress as tolerated. Patient with some instability during ambulation, with several LOB but was able to self correct. Highly recommend continued mobility with staff and use of RW (rollator) upon acute discharge with HHPT to evaluate mobility at home (indepenedent living facility).     Follow Up Recommendations Home health PT;Supervision - Intermittent    Equipment Recommendations  None recommended by PT    Recommendations for Other Services       Precautions / Restrictions Precautions Precautions: Fall Restrictions Weight Bearing Restrictions: No      Mobility  Bed Mobility Overal bed mobility: Independent             General bed mobility comments: no physical assist required  Transfers Overall transfer level: Independent Equipment used: None             General transfer comment: no physical assist required to come to standing, modest instability noted but able to self correct.  Ambulation/Gait Ambulation/Gait assistance: Min guard Ambulation Distance (Feet): 210 Feet Assistive device: Rolling walker (2 wheeled);None (60 with RW, 150 without device) Gait Pattern/deviations: Step-through pattern;Decreased stride length;Drifts right/left;Narrow base of support Gait velocity: decreased Gait velocity interpretation: Below normal speed for age/gender General Gait Details: some instability noted with  ambulation when not using device. Multiple LOB but patient was able to correct. one LOB where patient had to reach for support on wall. Walked with RW and patient demonstrated improved stability. Highly encouraged to use Rollator upon discharge.  Stairs            Wheelchair Mobility    Modified Rankin (Stroke Patients Only)       Balance Overall balance assessment: History of Falls                                           Pertinent Vitals/Pain Pain Assessment: No/denies pain    Home Living Family/patient expects to be discharged to:: Assisted living ([facility]) Living Arrangements: Other (Comment);Children             Home Equipment: Walker - 4 wheels      Prior Function Level of Independence: Independent               Hand Dominance   Dominant Hand: Right    Extremity/Trunk Assessment   Upper Extremity Assessment: Overall WFL for tasks assessed           Lower Extremity Assessment: Overall WFL for tasks assessed         Communication   Communication: No difficulties  Cognition Arousal/Alertness: Awake/alert Behavior During Therapy: WFL for tasks assessed/performed Overall Cognitive Status: Within Functional Limits for tasks assessed                      General Comments      Exercises        Assessment/Plan    PT Assessment Patient needs continued  PT services  PT Diagnosis Difficulty walking;Abnormality of gait   PT Problem List Decreased balance;Decreased mobility  PT Treatment Interventions DME instruction;Gait training;Functional mobility training;Therapeutic activities;Therapeutic exercise;Balance training;Patient/family education   PT Goals (Current goals can be found in the Care Plan section) Acute Rehab PT Goals Patient Stated Goal: to go back home PT Goal Formulation: With patient Time For Goal Achievement: 05/15/15 Potential to Achieve Goals: Good    Frequency Min 3X/week   Barriers  to discharge Decreased caregiver support      Co-evaluation               End of Session Equipment Utilized During Treatment: Gait belt Activity Tolerance: Patient tolerated treatment well Patient left: in chair;with call bell/phone within reach Nurse Communication: Mobility status    Functional Assessment Tool Used: clinical judgement Functional Limitation: Mobility: Walking and moving around Mobility: Walking and Moving Around Current Status (Q2595): At least 20 percent but less than 40 percent impaired, limited or restricted Mobility: Walking and Moving Around Goal Status [phone]): At least 1 percent but less than 20 percent impaired, limited or restricted    Time: 1216-1234 PT Time Calculation (min) (ACUTE ONLY): 18 min   Charges:   PT Evaluation $PT Eval Moderate Complexity: 1 Procedure     PT G Codes:   PT G-Codes **NOT FOR INPATIENT CLASS** Functional Assessment Tool Used: clinical judgement Functional Limitation: Mobility: Walking and moving around Mobility: Walking and Moving Around Current Status (I4332): At least 20 percent but less than 40 percent impaired, limited or restricted Mobility: Walking and Moving Around Goal Status [phone]): At least 1 percent but less than 20 percent impaired, limited or restricted    Fabio Asa 05/01/2015, 1:22 PM Charlotte Crumb, PT DPT  [phone]

## 2015-05-02 DIAGNOSIS — R079 Chest pain, unspecified: Secondary | ICD-10-CM | POA: Insufficient documentation

## 2015-05-15 DIAGNOSIS — G894 Chronic pain syndrome: Secondary | ICD-10-CM | POA: Diagnosis not present

## 2015-05-15 DIAGNOSIS — M47816 Spondylosis without myelopathy or radiculopathy, lumbar region: Secondary | ICD-10-CM | POA: Diagnosis not present

## 2015-05-15 DIAGNOSIS — M47812 Spondylosis without myelopathy or radiculopathy, cervical region: Secondary | ICD-10-CM | POA: Diagnosis not present

## 2015-05-15 DIAGNOSIS — Z79891 Long term (current) use of opiate analgesic: Secondary | ICD-10-CM | POA: Diagnosis not present

## 2015-05-19 ENCOUNTER — Encounter: Payer: Self-pay | Admitting: Cardiovascular Disease

## 2015-05-19 ENCOUNTER — Ambulatory Visit (INDEPENDENT_AMBULATORY_CARE_PROVIDER_SITE_OTHER): Payer: Medicare Other | Admitting: Cardiovascular Disease

## 2015-05-19 VITALS — BP 124/62 | HR 69 | Ht 62.0 in | Wt 107.1 lb

## 2015-05-19 DIAGNOSIS — R000 Tachycardia, unspecified: Secondary | ICD-10-CM | POA: Diagnosis not present

## 2015-05-19 DIAGNOSIS — R002 Palpitations: Secondary | ICD-10-CM

## 2015-05-19 DIAGNOSIS — I10 Essential (primary) hypertension: Secondary | ICD-10-CM

## 2015-05-19 MED ORDER — METOPROLOL SUCCINATE ER 25 MG PO TB24: 25.0000 mg | ORAL_TABLET | Freq: Every day | ORAL | Status: DC

## 2015-05-19 NOTE — Patient Instructions (Addendum)
Medication Instructions:  STOP AMLODIPINE   START TOPROL XL (METOPROLOL) 25 MG DAILY  Labwork: NONE  Testing/Procedures: NONE  Follow-Up: Your physician recommends that you schedule a follow-up appointment in: 3 MONTH OV  Any Other Special Instructions Will Be Listed Below (If Applicable). MAKE SURE YOU FOLLOW UP WITH DR GREEN IN 2 WEEKS FOR BLOOD PRESSURE CHECK   If you need a refill on your cardiac medications before your next appointment, please call your pharmacy.

## 2015-05-19 NOTE — Progress Notes (Signed)
Cardiology Office Note   Date:  05/19/2015   ID:  [name], DOB 01-26-1925, MRN [id number]  PCP:  Kimber Relic, MD  Cardiologist:   Madilyn Hook, MD   Chief Complaint  Patient presents with  . New Patient (Initial Visit)    pt states she has not had any problems since leaving the hospital      History of Present Illness: [name] is a 82 y.o. female with hypertension, hyperlipidemia, recurrent pleural effusions, PACs and PVCs who presents for an evaluation of arrhythmia.  [name] was seen in the ED on 2/20 with a complaint of palpitations and pre-syncope.  She got up to walk to the dining hall and felt like her heart was racing.  It happened suddenly and she was unable to walk so she called for assistance. EMS arrived on the site and her heart rate reportedly ranged from the 80s -160s.  She denied chest pain but felt very weak at the time.  The episode lasted for approximately 2 hours and was associated with lightheadedness and dizziness.  [name] was seen in the ED where telemetry showed sinus rhythm and sinus tachycardia up to the 120s.  Cardiac enzymes were negative and echo revealed LVEF 55-60% and grade 1 diastolic dysfunction.  TSH was normal.  She was instructed to follow up with cardiology as an outpatient.   Since that episode she denies any recurrent palpitations. She has not noticed any lightheadedness, dizziness,breath or chest pain.  [name] lives at [facility]  Past Medical History  Diagnosis Date  . Vitamin D deficiency 04/25/2011  . Acute upper respiratory infections of unspecified site 2013  . Loss of weight 04/25/2011  . Other drug allergy(995.27) 04/04/2011  . Cellulitis and abscess of hand, except fingers and thumb 10/04/2010  . Urinary tract infection, site not specified   . Pain in limb 2012  . Urticaria, unspecified 11/01/2008  . Myalgia and myositis, unspecified 01/07/2007  . Supraventricular premature beats 10/2006  .  Hyperlipidemia 2007  . Hypertension 2007  . 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 Spinal stenosis, unspecified region other than cervical 2002  . Osteoarthrosis, unspecified whether generalized or localized, unspecified site 07/22/2000  . Nontoxic uninodular goiter 08/15/1998  . Diaphragmatic hernia without mention of obstruction or gangrene 08/15/1998  . Diverticulosis of colon (without mention of hemorrhage) 2000  . Degeneration of intervertebral disc, site unspecified 2000  . Osteoporosis 2000  . Personal history of fall 2012  . Sprain of ankle, left   . Impacted cerumen   . Urine frequency 01/20/2014  . Tricuspid regurgitation 07/25/14    Mild-moderate  . Pulmonary hypertension (HCC) 07/25/14    Mild-moderate    Past Surgical History  Procedure Laterality Date  . Laryngoscopy  1987    and biopsy  Dr. Haroldine Laws  . Back surgery  1990    HNP L3-4 Dr. Lynnette Caffey  . Facial cosmetic surgery  1995    Dr. Charolotte Eke  . Colonoscopy  1992    acute segmental colitis Dr. Russella Dar  . Cataract extraction w/ intraocular lens implant Right 05/19/2014    Dr. Dione Booze     Current Outpatient Prescriptions  Medication Sig Dispense Refill  . aspirin EC 81 MG tablet Take 81 mg by mouth at bedtime.    . cholecalciferol (VITAMIN D) 1000 UNITS tablet Take 1,000 Units by mouth daily.     Marland Kitchen docusate sodium (COLACE) 100 MG capsule Take  100 mg by mouth 3 (three) times daily. 7:30am, 2pm, and bedtime -to prevent constipation    . HYDROcodone-acetaminophen (NORCO/VICODIN) 5-325 MG per tablet Take 1 tablet by mouth 3 (three) times daily. 7:30am, 2pm, and bedtime    . Magnesium 250 MG TABS Take 250 mg by mouth daily at 2 PM.     . Multiple Vitamin (MULTIVITAMIN WITH MINERALS) TABS tablet Take 1 tablet by mouth daily at 2 PM.    . Multiple Vitamins-Minerals (PRESERVISION AREDS 2) CAPS Take 1 capsule by mouth 2 (two) times daily.    . rosuvastatin (CRESTOR) 10 MG tablet Take 10 mg by mouth  at bedtime.    . metoprolol succinate (TOPROL XL) 25 MG 24 hr tablet Take 1 tablet (25 mg total) by mouth daily. 30 tablet 5   No current facility-administered medications for this visit.    Allergies:   Doxycycline; Lipitor; Pantoprazole; and Pepcid    Social History:  The patient  reports that she has never smoked. She has never used smokeless tobacco. She reports that she does not drink alcohol or use illicit drugs.   Family History:  The patient's family history includes Hypertension in her brother; Pancreatic cancer in her sister; Rheum arthritis in her maternal grandmother; Stroke in her father and mother.    ROS:  Please see the history of present illness.   Otherwise, review of systems are positive for none.   All other systems are reviewed and negative.    PHYSICAL EXAM: VS:  BP 124/62 mmHg  Pulse 69  Ht 5\' 2"  (1.575 m)  Wt 48.58 kg (107 lb 1.6 oz)  BMI 19.58 kg/m2 , BMI Body mass index is 19.58 kg/(m^2). GENERAL:  Well appearing HEENT:  Pupils equal round and reactive, fundi not visualized, oral mucosa unremarkable NECK:  No jugular venous distention, waveform within normal limits, carotid upstroke brisk and symmetric, no bruits, no thyromegaly LYMPHATICS:  No cervical adenopathy LUNGS:  Clear to auscultation bilaterally HEART:  RRR.  PMI not displaced or sustained,S1 and S2 within normal limits, no S3, no S4, no clicks, no rubs, no murmurs ABD:  Flat, positive bowel sounds normal in frequency in pitch, no bruits, no rebound, no guarding, no midline pulsatile mass, no hepatomegaly, no splenomegaly EXT:  2 plus pulses throughout, no edema, no cyanosis no clubbing SKIN:  No rashes no nodules NEURO:  Cranial nerves II through XII grossly intact, motor grossly intact throughout PSYCH:  Cognitively intact, oriented to person place and time   EKG:  EKG is ordered today. The ekg ordered today demonstrates sinus rhythm. Rate 69 bpm.  Echo 05/01/15: Study Conclusions  -  Left ventricle: The cavity size was normal. Systolic function was  normal. The estimated ejection fraction was in the range of 55%  to 60%. Wall motion was normal; there were no regional wall  motion abnormalities. There was an increased relative  contribution of atrial contraction to ventricular filling.  Doppler parameters are consistent with abnormal left ventricular  relaxation (grade 1 diastolic dysfunction). - Aortic valve: Trileaflet; mildly thickened, mildly calcified  leaflets. - Mitral valve: Calcified annulus. There was mild regurgitation. - Tricuspid valve: There was mild regurgitation.   Recent Labs: 08/31/2014: ALT 11; Pro B Natriuretic peptide (BNP) 141.0* 04/29/2015: Magnesium 2.1 04/30/2015: BUN 17; Creatinine, Ser 0.84; Hemoglobin 11.1*; Platelets 244; Potassium 4.2; Sodium 139; TSH 1.047    Lipid Panel    Component Value Date/Time   CHOL 103 01/11/2014   TRIG 75 01/11/2014  HDL 46 01/11/2014   CHOLHDL 2.3 10/05/2006 1400   VLDL 13 10/05/2006 1400   LDLCALC 42 01/11/2014      Wt Readings from Last 3 Encounters:  05/19/15 48.58 kg (107 lb 1.6 oz)  05/01/15 47.356 kg (104 lb 6.4 oz)  03/02/15 48.081 kg (106 lb)      ASSESSMENT AND PLAN:  # Tachycardia, near syncope: It is unclear what rhythm [name] had the night of her episode.  She was noted to have PACs and PVCs, though none are seen today and she denies recurrent symptoms.  Given that it has not recurred, we will not perform ambulatory monitoring at this time.  It is possible that she may have expeienced atrial fibrillation.  Her labs were unremarkable including thyroid function, CBC and electrolytes.  We will stop amlodipine and start metoprolol XL 25 mg daily.  We will not start anticoagulation because it is not clear that she had atrial fibrillation and the risk outweighs the benefit.  Continue aspirin 81 mg daily.    # Hypertension: Blood pressure is well-controlled.  However, we will switch  amlodipine to metoprolol as above for better heart rate control.    Current medicines are reviewed at length with the patient today.  The patient does not have concerns regarding medicines.  The following changes have been made:  Stop amlodipine.  Start metoprolol 25 mg daily.   Labs/ tests ordered today include:   Orders Placed This Encounter  Procedures  . EKG 12-Lead     Disposition:   FU with Genee Rann C. Duke Salvia, MD, [facility] in 3 months   This note was written with the assistance of speech recognition software.  Please excuse any transcriptional errors.  Signed, [place] C. Duke Salvia, MD, [facility]  05/19/2015 1:51 PM    [facility]

## 2015-05-23 ENCOUNTER — Encounter (INDEPENDENT_AMBULATORY_CARE_PROVIDER_SITE_OTHER): Payer: Medicare Other | Admitting: Ophthalmology

## 2015-05-23 DIAGNOSIS — I10 Essential (primary) hypertension: Secondary | ICD-10-CM | POA: Diagnosis not present

## 2015-05-23 DIAGNOSIS — H353134 Nonexudative age-related macular degeneration, bilateral, advanced atrophic with subfoveal involvement: Secondary | ICD-10-CM

## 2015-05-23 DIAGNOSIS — H43813 Vitreous degeneration, bilateral: Secondary | ICD-10-CM | POA: Diagnosis not present

## 2015-05-23 DIAGNOSIS — H33301 Unspecified retinal break, right eye: Secondary | ICD-10-CM | POA: Diagnosis not present

## 2015-05-23 DIAGNOSIS — H35033 Hypertensive retinopathy, bilateral: Secondary | ICD-10-CM | POA: Diagnosis not present

## 2015-06-01 DIAGNOSIS — I10 Essential (primary) hypertension: Secondary | ICD-10-CM | POA: Diagnosis not present

## 2015-06-01 DIAGNOSIS — J948 Other specified pleural conditions: Secondary | ICD-10-CM | POA: Diagnosis not present

## 2015-06-01 LAB — BASIC METABOLIC PANEL
CREATININE: 1.1 mg/dL (ref 0.5–1.1)
GLUCOSE: 83 mg/dL
Potassium: 4.5 mmol/L (ref 3.4–5.3)
Sodium: 138 mmol/L (ref 137–147)

## 2015-06-08 ENCOUNTER — Encounter: Payer: Self-pay | Admitting: Internal Medicine

## 2015-06-08 ENCOUNTER — Non-Acute Institutional Stay: Payer: Medicare Other | Admitting: Internal Medicine

## 2015-06-08 VITALS — BP 118/62 | HR 64 | Temp 98.3°F | Ht 62.0 in | Wt 108.0 lb

## 2015-06-08 DIAGNOSIS — J948 Other specified pleural conditions: Secondary | ICD-10-CM | POA: Diagnosis not present

## 2015-06-08 DIAGNOSIS — R634 Abnormal weight loss: Secondary | ICD-10-CM | POA: Diagnosis not present

## 2015-06-08 DIAGNOSIS — J90 Pleural effusion, not elsewhere classified: Secondary | ICD-10-CM

## 2015-06-08 DIAGNOSIS — R079 Chest pain, unspecified: Secondary | ICD-10-CM | POA: Diagnosis not present

## 2015-06-08 DIAGNOSIS — I10 Essential (primary) hypertension: Secondary | ICD-10-CM | POA: Diagnosis not present

## 2015-06-08 DIAGNOSIS — R002 Palpitations: Secondary | ICD-10-CM

## 2015-06-08 NOTE — Progress Notes (Signed)
Patient ID: [name], female   DOB: Jul 28, 1924, 82 y.o.   MRN: [id number]   Location:  [facility]   Place of Service:  Clinic [phone])  Provider: Murray Hodgkins, MD  Code Status: full Goals of Care:  Advanced Directives 06/08/2015  Does patient have an advance directive? Yes  Type of Estate agent of [place];Living will  Does patient want to make changes to advanced directive? -  Copy of advanced directive(s) in chart? Yes     Chief Complaint  Patient presents with  . Medical Management of Chronic Issues    medication management, blood pressure  . Hospitalization Follow-up    was in hospital 04/29/15 to 05/01/15 near syncope    HPI: Patient is a 82 y.o. female seen today for medical management of chronic diseases.    Hospitalized for near syncope 04/29/15 to 05/01/15. Telemetry demonstrated NSR with occasional sinus tachycardia to the 120s, but otherwise normal rate. Troponins were negative. ECHO demonstrated normal systolic function with ejection fraction of 55-60%, no regional wall motion abnormalities, grade 1 diastolic dysfunction. She had mild mitral valve regurgitation and mild tricuspid valve regurgitation. Hospitalist opinion was that her story suggests a-fib with RVR or other tachyarrhythmia which we have not captured, however, her telemetry strips from EMS demonstrated only premature atrial contractions. Recommend outpatient cardiology follow up. Her TSH, magnesium, and potassium were wnl. No evidence of underlying infection.  Had tachycardia prior to being seen in the ER with rate up to 160 bpm.  Has seen Dr. Chilton Si, cardiologist. Pt. Was started on metoprolol to control heart rhythm.  Has had some chest pain. Unable to identify trigger factors or alleviating factors. Pain is present most days. Deep breathing may aggravate the pain. Denies heartburn or indigestion. Has some bloating.  Fatigued much of the time.   Gait  disturbance - using walker   Past Medical History  Diagnosis Date  . Vitamin D deficiency 04/25/2011  . Acute upper respiratory infections of unspecified site 2013  . Loss of weight 04/25/2011  . Other drug allergy(995.27) 04/04/2011  . Cellulitis and abscess of hand, except fingers and thumb 10/04/2010  . Urinary tract infection, site not specified   . Pain in limb 2012  . Urticaria, unspecified 11/01/2008  . Myalgia and myositis, unspecified 01/07/2007  . Supraventricular premature beats 10/2006  . Hyperlipidemia 2007  . Hypertension 2007  . 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 Spinal stenosis, unspecified region other than cervical 2002  . Osteoarthrosis, unspecified whether generalized or localized, unspecified site 07/22/2000  . Nontoxic uninodular goiter 08/15/1998  . Diaphragmatic hernia without mention of obstruction or gangrene 08/15/1998  . Diverticulosis of colon (without mention of hemorrhage) 2000  . Degeneration of intervertebral disc, site unspecified 2000  . Osteoporosis 2000  . Personal history of fall 2012  . Sprain of ankle, left   . Impacted cerumen   . Urine frequency 01/20/2014  . Tricuspid regurgitation 07/25/14    Mild-moderate  . Pulmonary hypertension (HCC) 07/25/14    Mild-moderate    Past Surgical History  Procedure Laterality Date  . Laryngoscopy  1987    and biopsy  Dr. Haroldine Laws  . Back surgery  1990    HNP L3-4 Dr. Lynnette Caffey  . Facial cosmetic surgery  1995    Dr. Charolotte Eke  . Colonoscopy  1992    acute segmental colitis Dr. Russella Dar  . Cataract extraction w/ intraocular lens implant Right 05/19/2014    Dr. Dione Booze  Allergies  Allergen Reactions  . Doxycycline Itching  . Lipitor [Atorvastatin] Other (See Comments)    PAIN IN LEGS  . Pantoprazole Itching  . Pepcid [Famotidine] Itching      Medication List       This list is accurate as of: 06/08/15  2:35 PM.  Always use your most recent med list.                aspirin EC 81 MG tablet  Take 81 mg by mouth at bedtime.     cholecalciferol 1000 units tablet  Commonly known as:  VITAMIN D  Take 1,000 Units by mouth daily.     docusate sodium 100 MG capsule  Commonly known as:  COLACE  Take 100 mg by mouth 3 (three) times daily. 7:30am, 2pm, and bedtime -to prevent constipation     HYDROcodone-acetaminophen 5-325 MG tablet  Commonly known as:  NORCO/VICODIN  Take 1 tablet by mouth 3 (three) times daily. 7:30am, 2pm, and bedtime     Magnesium 250 MG Tabs  Take 250 mg by mouth daily at 2 PM.     metoprolol succinate 25 MG 24 hr tablet  Commonly known as:  TOPROL XL  Take 1 tablet (25 mg total) by mouth daily.     multivitamin with minerals Tabs tablet  Take 1 tablet by mouth daily at 2 PM.     PRESERVISION AREDS 2 Caps  Take 1 capsule by mouth 2 (two) times daily.     rosuvastatin 10 MG tablet  Commonly known as:  CRESTOR  Take 10 mg by mouth at bedtime.        Review of Systems:  Review of Systems  Constitutional: Positive for fatigue. Negative for activity change and appetite change.  HENT: Positive for voice change (chronic dysphonia).        History of goiter.  Eyes:       Cataracts  Respiratory: Positive for cough and shortness of breath.        Hx of Right pleural effusion. Pneumonia in May 2016, Chronic hoarseness.  Cardiovascular: Negative for chest pain, palpitations and leg swelling.  Gastrointestinal:       Mild dysphagia and anorexia.  Endocrine: Negative.   Genitourinary: Positive for flank pain (right, associated with pleural effusion).  Musculoskeletal: Positive for back pain.       Pains radiate from low back down the right leg.   Skin: Negative.   Neurological:       Remote hx CVA.  Psychiatric/Behavioral: The patient is nervous/anxious.     Health Maintenance  Topic Date Due  . DEXA SCAN  09/02/1991  . PNA vac Low Risk Adult (1 of 2 - PCV13) 03/11/1992  . INFLUENZA VACCINE  10/10/2015  .  TETANUS/TDAP  04/24/2021  . ZOSTAVAX  Completed    Physical Exam: Filed Vitals:   06/08/15 1357  BP: 118/62  Pulse: 64  Temp: 98.3 F (36.8 C)  TempSrc: Oral  Height: 5\' 2"  (1.575 m)  Weight: 108 lb (48.988 kg)  SpO2: 99%   Body mass index is 19.75 kg/(m^2). Physical Exam  Constitutional: She is oriented to person, place, and time. She appears well-developed. No distress.  Thin  HENT:  Head: Normocephalic.  Loss of hearing  Eyes: Conjunctivae and EOM are normal. Pupils are equal, round, and reactive to light.  Neck: Neck supple. No JVD present. No tracheal deviation present. Thyromegaly present.  Cardiovascular: Normal rate, regular rhythm, normal heart sounds and intact distal pulses.  Exam reveals no gallop and no friction rub.   No murmur heard. Bilateral mild varicose veins.  Pulmonary/Chest: Breath sounds normal. No respiratory distress. She has no wheezes. She has no rales.  Chronic hoarseness.  Abdominal: Bowel sounds are normal. She exhibits no distension and no mass. There is no tenderness.  Musculoskeletal: Normal range of motion. She exhibits no edema or tenderness.  Bony sacrum and coccyx prominent  Lymphadenopathy:    She has no cervical adenopathy.  Neurological: She is alert and oriented to person, place, and time. She has normal reflexes. No cranial nerve deficit. She exhibits normal muscle tone. Coordination normal.  Skin: No rash noted. No erythema. No pallor.  Psychiatric: She has a normal mood and affect. Her behavior is normal. Judgment and thought content normal.    Labs reviewed: Basic Metabolic Panel:  Recent Labs  40/97/35 1545 04/29/15 2130 04/30/15 0447 06/01/15  NA 132* 137 139 138  K 4.5 4.1 4.2 4.5  CL 95* 100* 106  --   CO2 29 27 26   --   GLUCOSE 74 113* 96  --   BUN 22 18 17   --   CREATININE 0.98 0.95 0.84 1.1  CALCIUM 9.7 9.0 9.0  --   MG  --  2.1  --   --   TSH 1.35  --  1.047  --    Liver Function Tests:  Recent Labs   07/25/14 0714 08/31/14 1545  AST 19 21  ALT 9* 11  ALKPHOS 60 64  BILITOT 0.6 0.5  PROT 6.4*  6.2* 7.8  ALBUMIN 3.2*  3.1* 4.2   No results for input(s): LIPASE, AMYLASE in the last 8760 hours. No results for input(s): AMMONIA in the last 8760 hours. CBC:  Recent Labs  07/24/14 2145  08/31/14 1545 10/26/14 1128 04/29/15 2130 04/30/15 0447  WBC 10.6*  < > 8.4 9.4 5.8 6.9  NEUTROABS 7.7  --  3.7 5.9  --   --   HGB 12.8  < > 12.6 12.9 10.9* 11.1*  HCT 38.5  < > 38.4 38.7 33.7* 33.6*  MCV 86.3  < > 86.0 85.9 85.1 84.8  PLT 281  < > 323.0 293.0 237 244  < > = values in this interval not displayed. Lipid Panel: No results for input(s): CHOL, HDL, LDLCALC, TRIG, CHOLHDL, LDLDIRECT in the last 8760 hours. Lab Results  Component Value Date   HGBA1C 5.7* 07/25/2014    Assessment/Plan  1. Essential hypertension Controlled  2. Pleural effusion, right Improved  3. Loss of weight Now slowly gaining weight  4. Palpitations Patient is aware. May represent PACs or tachycardia arrhythmias.  5. Chest pain, unspecified chest pain type Uncertain etiology, but I believe related to the previous pleural effusion.

## 2015-08-19 NOTE — Progress Notes (Signed)
Cardiology Office Note   Date:  08/21/2015   ID:  [name], DOB [birth year], MRN [id number]  PCP:  Jasmine Relic, MD  Cardiologist:   Jasmine Si, MD   Chief Complaint  Patient presents with  . Follow-up    3 months--med changes--started metoprolol 25  pt states no Sx.     History of Present Illness: [name] is a 82 y.o. female with hypertension, hyperlipidemia, recurrent pleural effusions, PACs and PVCs who presents for follow up on arrhythmia.  [name] was initially evaluated on 3/10 after being seen in the ED on 2/20 with a complaint of palpitations and pre-syncope.  In the ED telemetry showed sinus rhythm and sinus tachycardia up to the 120s.  Cardiac enzymes were negative and echo revealed LVEF 55-60% and grade 1 diastolic dysfunction.  TSH was normal. By the time she followed up in our office she denied recurrent symptoms.  Amlodipine was discontinued and she was started on metoprolol.    [name] has been feeling well.  She notes that this is the best she has been feeling in a long time. She denies any chest pain shortness of breath. She also has not noted any recurrent arrhythmias. She denies any lower extremity edema, orthopnea or PND.  She reports a history of recurrent pleural effusion that was last drained one year ago.  She  She last had her pleural effusion drained one year ago.  She reports that her breathing has been stable.   Ms. Overall lives at [facility] and has many friends there.    Past Medical History  Diagnosis Date  . Vitamin D deficiency 04/25/2011  . Acute upper respiratory infections of unspecified site 2013  . Loss of weight 04/25/2011  . Other drug allergy(995.27) 04/04/2011  . Cellulitis and abscess of hand, except fingers and thumb 10/04/2010  . Urinary tract infection, site not specified   . Pain in limb 2012  . Urticaria, unspecified 11/01/2008  . Myalgia and myositis, unspecified 01/07/2007  . Supraventricular  premature beats 10/2006  . Hyperlipidemia 2007  . Hypertension 2007  . 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 Spinal stenosis, unspecified region other than cervical 2002  . Osteoarthrosis, unspecified whether generalized or localized, unspecified site 07/22/2000  . Nontoxic uninodular goiter 08/15/1998  . Diaphragmatic hernia without mention of obstruction or gangrene 08/15/1998  . Diverticulosis of colon (without mention of hemorrhage) 2000  . Degeneration of intervertebral disc, site unspecified 2000  . Osteoporosis 2000  . Personal history of fall 2012  . Sprain of ankle, left   . Impacted cerumen   . Urine frequency 01/20/2014  . Tricuspid regurgitation 07/25/14    Mild-moderate  . Pulmonary hypertension (HCC) 07/25/14    Mild-moderate    Past Surgical History  Procedure Laterality Date  . Laryngoscopy  1987    and biopsy  Dr. Haroldine Laws  . Back surgery  1990    HNP L3-4 Dr. Lynnette Caffey  . Facial cosmetic surgery  1995    Dr. Charolotte Eke  . Colonoscopy  1992    acute segmental colitis Dr. Russella Dar  . Cataract extraction w/ intraocular lens implant Right 05/19/2014    Dr. Dione Booze     Current Outpatient Prescriptions  Medication Sig Dispense Refill  . aspirin EC 81 MG tablet Take 81 mg by mouth at bedtime.    . cholecalciferol (VITAMIN D) 1000 UNITS tablet Take 1,000 Units by mouth daily.     [name]  docusate sodium (COLACE) 100 MG capsule Take 100 mg by mouth 3 (three) times daily. 7:30am, 2pm, and bedtime -to prevent constipation    . HYDROcodone-acetaminophen (NORCO/VICODIN) 5-325 MG per tablet Take 1 tablet by mouth 3 (three) times daily. 7:30am, 2pm, and bedtime    . Magnesium 250 MG TABS Take 250 mg by mouth daily at 2 PM.     . metoprolol succinate (TOPROL XL) 25 MG 24 hr tablet Take 1 tablet (25 mg total) by mouth daily. 90 tablet 3  . Multiple Vitamin (MULTIVITAMIN WITH MINERALS) TABS tablet Take 1 tablet by mouth daily at 2 PM.    . Multiple  Vitamins-Minerals (PRESERVISION AREDS 2) CAPS Take 1 capsule by mouth 2 (two) times daily.    . rosuvastatin (CRESTOR) 10 MG tablet Take 10 mg by mouth at bedtime.     No current facility-administered medications for this visit.    Allergies:   Doxycycline; Lipitor; Pantoprazole; and Pepcid    Social History:  The patient  reports that she has never smoked. She has never used smokeless tobacco. She reports that she does not drink alcohol or use illicit drugs.   Family History:  The patient's family history includes Hypertension in her brother; Pancreatic cancer in her sister; Rheum arthritis in her maternal grandmother; Stroke in her father and mother.    ROS:  Please see the history of present illness.   Otherwise, review of systems are positive for none.   All other systems are reviewed and negative.    PHYSICAL EXAM: VS:  BP 118/60 mmHg  Pulse 61  Ht 5\' 2"  (1.575 m)  Wt 109 lb 6.4 oz (49.624 kg)  BMI 20.00 kg/m2 , BMI Body mass index is 20 kg/(m^2). GENERAL:  Well appearing HEENT:  Pupils equal round and reactive, fundi not visualized, oral mucosa unremarkable NECK:  No jugular venous distention, waveform within normal limits, carotid upstroke brisk and symmetric, no bruits, no thyromegaly LYMPHATICS:  No cervical adenopathy LUNGS:  Clear to auscultation bilaterally HEART:  RRR.  PMI not displaced or sustained,S1 and S2 within normal limits, no S3, no S4, no clicks, no rubs, no murmurs ABD:  Flat, positive bowel sounds normal in frequency in pitch, no bruits, no rebound, no guarding, no midline pulsatile mass, no hepatomegaly, no splenomegaly EXT:  2 plus pulses throughout, no edema, no cyanosis no clubbing SKIN:  No rashes no nodules NEURO:  Cranial nerves II through XII grossly intact, motor grossly intact throughout PSYCH:  Cognitively intact, oriented to person place and time   EKG:  EKG is ordered today. The ekg ordered today demonstrates sinus rhythm. Rate 61  bpm.  Echo 05/01/15: Study Conclusions  - Left ventricle: The cavity size was normal. Systolic function was  normal. The estimated ejection fraction was in the range of 55%  to 60%. Wall motion was normal; there were no regional wall  motion abnormalities. There was an increased relative  contribution of atrial contraction to ventricular filling.  Doppler parameters are consistent with abnormal left ventricular  relaxation (grade 1 diastolic dysfunction). - Aortic valve: Trileaflet; mildly thickened, mildly calcified  leaflets. - Mitral valve: Calcified annulus. There was mild regurgitation. - Tricuspid valve: There was mild regurgitation.   Recent Labs: 08/31/2014: ALT 11; Pro B Natriuretic peptide (BNP) 141.0* 04/29/2015: Magnesium 2.1 04/30/2015: BUN 17; Hemoglobin 11.1*; Platelets 244; TSH 1.047 06/01/2015: Creatinine 1.1; Potassium 4.5; Sodium 138    Lipid Panel    Component Value Date/Time   CHOL 103 01/11/2014  TRIG 75 01/11/2014   HDL 46 01/11/2014   CHOLHDL 2.3 10/05/2006 1400   VLDL 13 10/05/2006 1400   LDLCALC 42 01/11/2014      Wt Readings from Last 3 Encounters:  08/21/15 109 lb 6.4 oz (49.624 kg)  06/08/15 108 lb (48.988 kg)  05/19/15 107 lb 1.6 oz (48.58 kg)      ASSESSMENT AND PLAN:  # Tachycardia, near syncope: It is unclear what rhythm Ms. Bouffard had the night of her episode.  She has not noted any recurrent symptoms and is doing well on metoprolol.  I offered her the option of wearing a heart monitor.  However, she is quite certain that she would know if she developed recurrent arrhythmia and prefers not to wear one at this time.  Continue metoprolol.  There has not been any documented atrial fibrillation so we will continue with aspirin only at this time.  # Hypertension: Blood pressure is well-controlled.  Continue metoprolol.  # Hyperlipidemia: Lipids are managed by her PCP. Continue rosuvastatin.:    Current medicines are reviewed at  length with the patient today.  The patient does not have concerns regarding medicines.  The following changes have been made:  none  Labs/ tests ordered today include:   Orders Placed This Encounter  Procedures  . EKG 12-Lead     Disposition:   FU with Tion Tse C. Duke Salvia, MD, [facility] in 3 months   This note was written with the assistance of speech recognition software.  Please excuse any transcriptional errors.  Signed, Emmary Culbreath C. Duke Salvia, MD, [facility]  08/21/2015 1:09 PM    [facility]

## 2015-08-21 ENCOUNTER — Ambulatory Visit (INDEPENDENT_AMBULATORY_CARE_PROVIDER_SITE_OTHER): Payer: Medicare Other | Admitting: Cardiovascular Disease

## 2015-08-21 ENCOUNTER — Encounter: Payer: Self-pay | Admitting: Cardiovascular Disease

## 2015-08-21 VITALS — BP 118/60 | HR 61 | Ht 62.0 in | Wt 109.4 lb

## 2015-08-21 DIAGNOSIS — R000 Tachycardia, unspecified: Secondary | ICD-10-CM | POA: Diagnosis not present

## 2015-08-21 DIAGNOSIS — I10 Essential (primary) hypertension: Secondary | ICD-10-CM

## 2015-08-21 DIAGNOSIS — E785 Hyperlipidemia, unspecified: Secondary | ICD-10-CM | POA: Diagnosis not present

## 2015-08-21 MED ORDER — METOPROLOL SUCCINATE ER 25 MG PO TB24: 25.0000 mg | ORAL_TABLET | Freq: Every day | ORAL | Status: DC

## 2015-08-21 NOTE — Patient Instructions (Signed)
Your physician wants you to follow-up in: ONE YEAR WITH DR  You will receive a reminder letter in the mail two months in advance. If you don't receive a letter, please call our office to schedule the follow-up appointment.   If you need a refill on your cardiac medications before your next appointment, please call your pharmacy.  

## 2015-09-13 ENCOUNTER — Other Ambulatory Visit: Payer: Self-pay | Admitting: Internal Medicine

## 2015-09-19 DIAGNOSIS — E785 Hyperlipidemia, unspecified: Secondary | ICD-10-CM | POA: Diagnosis not present

## 2015-09-19 DIAGNOSIS — I10 Essential (primary) hypertension: Secondary | ICD-10-CM | POA: Diagnosis not present

## 2015-09-19 LAB — BASIC METABOLIC PANEL
BUN: 19 mg/dL (ref 4–21)
Creatinine: 1.1 mg/dL (ref 0.5–1.1)
Glucose: 74 mg/dL
Potassium: 4.9 mmol/L (ref 3.4–5.3)
SODIUM: 138 mmol/L (ref 137–147)

## 2015-09-19 LAB — LIPID PANEL
Cholesterol: 99 mg/dL (ref 0–200)
HDL: 55 mg/dL (ref 35–70)
LDL CALC: 30 mg/dL
TRIGLYCERIDES: 71 mg/dL (ref 40–160)

## 2015-09-20 ENCOUNTER — Encounter: Payer: Self-pay | Admitting: *Deleted

## 2015-09-22 DIAGNOSIS — M47812 Spondylosis without myelopathy or radiculopathy, cervical region: Secondary | ICD-10-CM | POA: Diagnosis not present

## 2015-09-22 DIAGNOSIS — M47816 Spondylosis without myelopathy or radiculopathy, lumbar region: Secondary | ICD-10-CM | POA: Diagnosis not present

## 2015-09-22 DIAGNOSIS — G894 Chronic pain syndrome: Secondary | ICD-10-CM | POA: Diagnosis not present

## 2015-09-22 DIAGNOSIS — Z79891 Long term (current) use of opiate analgesic: Secondary | ICD-10-CM | POA: Diagnosis not present

## 2015-09-27 ENCOUNTER — Ambulatory Visit (INDEPENDENT_AMBULATORY_CARE_PROVIDER_SITE_OTHER): Payer: Medicare Other | Admitting: Ophthalmology

## 2015-09-27 DIAGNOSIS — H35033 Hypertensive retinopathy, bilateral: Secondary | ICD-10-CM | POA: Diagnosis not present

## 2015-09-27 DIAGNOSIS — I10 Essential (primary) hypertension: Secondary | ICD-10-CM

## 2015-09-27 DIAGNOSIS — H353124 Nonexudative age-related macular degeneration, left eye, advanced atrophic with subfoveal involvement: Secondary | ICD-10-CM | POA: Diagnosis not present

## 2015-09-27 DIAGNOSIS — H353112 Nonexudative age-related macular degeneration, right eye, intermediate dry stage: Secondary | ICD-10-CM

## 2015-09-27 DIAGNOSIS — H33301 Unspecified retinal break, right eye: Secondary | ICD-10-CM | POA: Diagnosis not present

## 2015-09-27 DIAGNOSIS — H43813 Vitreous degeneration, bilateral: Secondary | ICD-10-CM

## 2015-09-28 ENCOUNTER — Encounter: Payer: Self-pay | Admitting: Internal Medicine

## 2015-09-28 ENCOUNTER — Non-Acute Institutional Stay: Payer: Medicare Other | Admitting: Internal Medicine

## 2015-09-28 VITALS — BP 112/78 | HR 62 | Temp 97.9°F | Ht 62.0 in | Wt 109.0 lb

## 2015-09-28 DIAGNOSIS — H9193 Unspecified hearing loss, bilateral: Secondary | ICD-10-CM

## 2015-09-28 DIAGNOSIS — E041 Nontoxic single thyroid nodule: Secondary | ICD-10-CM

## 2015-09-28 DIAGNOSIS — J948 Other specified pleural conditions: Secondary | ICD-10-CM

## 2015-09-28 DIAGNOSIS — E785 Hyperlipidemia, unspecified: Secondary | ICD-10-CM

## 2015-09-28 DIAGNOSIS — H9190 Unspecified hearing loss, unspecified ear: Secondary | ICD-10-CM

## 2015-09-28 DIAGNOSIS — J90 Pleural effusion, not elsewhere classified: Secondary | ICD-10-CM

## 2015-09-28 DIAGNOSIS — I10 Essential (primary) hypertension: Secondary | ICD-10-CM

## 2015-09-28 DIAGNOSIS — R634 Abnormal weight loss: Secondary | ICD-10-CM

## 2015-09-28 HISTORY — DX: Unspecified hearing loss, unspecified ear: H91.90

## 2015-09-28 NOTE — Progress Notes (Signed)
Patient ID: [name], female   DOB: 1925-02-11, 82 y.o.   MRN: [id number]    Facility  [facility]    Place of Service: Clinic (12)     Allergies  Allergen Reactions  . Doxycycline Itching  . Lipitor [Atorvastatin] Other (See Comments)    PAIN IN LEGS  . Pantoprazole Itching  . Pepcid [Famotidine] Itching    Chief Complaint  Patient presents with  . Medical Management of Chronic Issues    HPI:   Essential hypertension - controlled  Hyperlipidemia - controlled on Crestor  Loss of weight - stable  Pleural effusion, right - improved  Nontoxic uninodular goiter - unchanged    Medications: Patient's Medications  New Prescriptions   No medications on file  Previous Medications   ASPIRIN EC 81 MG TABLET    Take 81 mg by mouth at bedtime.   CHOLECALCIFEROL (VITAMIN D) 1000 UNITS TABLET    Take 1,000 Units by mouth daily.    CRESTOR 10 MG TABLET    TAKE 1 TABLET DAILY TO LOWER CHOLESTEROL.   DOCUSATE SODIUM (COLACE) 100 MG CAPSULE    Take 100 mg by mouth 3 (three) times daily. 7:30am, 2pm, and bedtime -to prevent constipation   HYDROCODONE-ACETAMINOPHEN (NORCO/VICODIN) 5-325 MG PER TABLET    Take 1 tablet by mouth 3 (three) times daily. 7:30am, 2pm, and bedtime   MAGNESIUM 250 MG TABS    Take 250 mg by mouth daily at 2 PM.    METOPROLOL SUCCINATE (TOPROL XL) 25 MG 24 HR TABLET    Take 1 tablet (25 mg total) by mouth daily.   MULTIPLE VITAMIN (MULTIVITAMIN WITH MINERALS) TABS TABLET    Take 1 tablet by mouth daily at 2 PM.   MULTIPLE VITAMINS-MINERALS (PRESERVISION AREDS 2) CAPS    Take 1 capsule by mouth 2 (two) times daily.  Modified Medications   No medications on file  Discontinued Medications   No medications on file     Review of Systems  Constitutional: Positive for fatigue. Negative for activity change and appetite change.  HENT: Positive for voice change (chronic dysphonia).        History of goiter.  Eyes:       Cataracts  Respiratory: Positive for cough  and shortness of breath.        Hx of Right pleural effusion. Pneumonia in May 2016, Chronic hoarseness.  Cardiovascular: Negative for chest pain, palpitations and leg swelling.  Gastrointestinal:       Mild dysphagia and anorexia.  Endocrine: Negative.   Genitourinary: Positive for flank pain (right, associated with pleural effusion).  Musculoskeletal: Positive for back pain.       Pains radiate from low back down the right leg.   Skin: Negative.   Neurological:       Remote hx CVA.  Psychiatric/Behavioral: The patient is nervous/anxious.     Filed Vitals:   09/28/15 1357  BP: 112/78  Pulse: 62  Temp: 97.9 F (36.6 C)  TempSrc: Oral  Height: _0  (1.575 m)  Weight: 109 lb (49.442 kg)  SpO2: 97%   Wt Readings from Last 3 Encounters:  09/28/15 109 lb (49.442 kg)  08/21/15 109 lb 6.4 oz (49.624 kg)  06/08/15 108 lb (48.988 kg)    Body mass index is 19.93 kg/(m^2).  Physical Exam  Constitutional: She is oriented to person, place, and time. She appears well-developed. No distress.  Thin  HENT:  Head: Normocephalic.  Loss of hearing  Eyes: Conjunctivae and EOM  are normal. Pupils are equal, round, and reactive to light.  Neck: Neck supple. No JVD present. No tracheal deviation present. Thyromegaly present.  Cardiovascular: Normal rate, regular rhythm, normal heart sounds and intact distal pulses.  Exam reveals no gallop and no friction rub.   No murmur heard. Bilateral mild varicose veins.  Pulmonary/Chest: No respiratory distress. She has no wheezes. She has rales (dry and bilateral).  Chronic hoarseness.  Abdominal: Bowel sounds are normal. She exhibits no distension and no mass. There is no tenderness.  Musculoskeletal: Normal range of motion. She exhibits no edema or tenderness.  Bony sacrum and coccyx prominent  Lymphadenopathy:    She has no cervical adenopathy.  Neurological: She is alert and oriented to person, place, and time. She has normal reflexes. No  cranial nerve deficit. She exhibits normal muscle tone. Coordination normal.  Skin: No rash noted. No erythema. No pallor.  Psychiatric: She has a normal mood and affect. Her behavior is normal. Judgment and thought content normal.     Labs reviewed: Lab Summary Latest Ref Rng 09/19/2015 06/01/2015 04/30/2015 04/29/2015  Hemoglobin 12.0 - 15.0 g/dL (None) (None) 11.1(L) 10.9(L)  Hematocrit 36.0 - 46.0 % (None) (None) 33.6(L) 33.7(L)  White count 4.0 - 10.5 K/uL (None) (None) 6.9 5.8  Platelet count 150 - 400 K/uL (None) (None) 244 237  Sodium 137 - 147 mmol/L 138 138 139 137  Potassium 3.4 - 5.3 mmol/L 4.9 4.5 4.2 4.1  Calcium 8.9 - 10.3 mg/dL (None) (None) 9.0 9.0  Phosphorus - (None) (None) (None) (None)  Creatinine 0.5 - 1.1 mg/dL 1.1 1.1 0.84 0.95  AST - (None) (None) (None) (None)  Alk Phos - (None) (None) (None) (None)  Bilirubin - (None) (None) (None) (None)  Glucose - 74 83 96 113(H)  Cholesterol 0 - 200 mg/dL 99 (None) (None) (None)  HDL cholesterol 35 - 70 mg/dL 55 (None) (None) (None)  Triglycerides 40 - 160 mg/dL 71 (None) (None) (None)  LDL Direct - (None) (None) (None) (None)  LDL Calc - 30 (None) (None) (None)  Total protein - (None) (None) (None) (None)  Albumin - (None) (None) (None) (None)   Lab Results  Component Value Date   TSH 1.047 04/30/2015   Lab Results  Component Value Date   BUN 19 09/19/2015   BUN 17 04/30/2015   BUN 18 04/29/2015   Lab Results  Component Value Date   CREATININE 1.1 09/19/2015   CREATININE 1.1 06/01/2015   CREATININE 0.84 04/30/2015   Lab Results  Component Value Date   HGBA1C 5.7* 07/25/2014       Assessment/Plan  1. Essential hypertension controlled  2. Hyperlipidemia controlled  3. Loss of weight stable  4. Pleural effusion, right Improved and possibly resolved  5. Nontoxic uninodular goiter unchanged

## 2015-10-04 DIAGNOSIS — H43813 Vitreous degeneration, bilateral: Secondary | ICD-10-CM | POA: Diagnosis not present

## 2015-10-04 DIAGNOSIS — H353132 Nonexudative age-related macular degeneration, bilateral, intermediate dry stage: Secondary | ICD-10-CM | POA: Diagnosis not present

## 2015-10-04 DIAGNOSIS — Z961 Presence of intraocular lens: Secondary | ICD-10-CM | POA: Diagnosis not present

## 2015-10-04 DIAGNOSIS — H04123 Dry eye syndrome of bilateral lacrimal glands: Secondary | ICD-10-CM | POA: Diagnosis not present

## 2015-10-04 DIAGNOSIS — H26491 Other secondary cataract, right eye: Secondary | ICD-10-CM | POA: Diagnosis not present

## 2015-10-12 ENCOUNTER — Non-Acute Institutional Stay: Payer: Medicare Other | Admitting: Internal Medicine

## 2015-10-12 ENCOUNTER — Ambulatory Visit
Admission: RE | Admit: 2015-10-12 | Discharge: 2015-10-12 | Disposition: A | Discharge status: Home / Self Care | Payer: Medicare Other | Source: Ambulatory Visit | Attending: Internal Medicine | Admitting: Internal Medicine

## 2015-10-12 ENCOUNTER — Encounter: Payer: Self-pay | Admitting: Internal Medicine

## 2015-10-12 DIAGNOSIS — R0789 Other chest pain: Secondary | ICD-10-CM | POA: Diagnosis not present

## 2015-10-12 DIAGNOSIS — R079 Chest pain, unspecified: Secondary | ICD-10-CM | POA: Diagnosis not present

## 2015-10-12 NOTE — Patient Instructions (Signed)
Get chest X ray today.

## 2015-10-12 NOTE — Progress Notes (Signed)
Facility  [facility]    Place of Service: Clinic (12)     Allergies  Allergen Reactions  . Doxycycline Itching  . Lipitor [Atorvastatin] Other (See Comments)    PAIN IN LEGS  . Pantoprazole Itching  . Pepcid [Famotidine] Itching    Chief Complaint  Patient presents with  . Acute Visit    10/02/15 out eating was choking a man did Heimlich maneuver. She's bruised and trouble breathing now    HPI:  Episode of choking on cheese that was on onion soup. Heimlich done 3 times. Anterior ribs and sternum are very sore. No further episo  Medications: Patient's Medications  New Prescriptions   No medications on file  Previous Medications   ASPIRIN EC 81 MG TABLET    Take 81 mg by mouth at bedtime.   CHOLECALCIFEROL (VITAMIN D) 1000 UNITS TABLET    Take 1,000 Units by mouth daily.    CRESTOR 10 MG TABLET    TAKE 1 TABLET DAILY TO LOWER CHOLESTEROL.   DOCUSATE SODIUM (COLACE) 100 MG CAPSULE    Take 100 mg by mouth 3 (three) times daily. 7:30am, 2pm, and bedtime -to prevent constipation   HYDROCODONE-ACETAMINOPHEN (NORCO/VICODIN) 5-325 MG PER TABLET    Take 1 tablet by mouth 3 (three) times daily. 7:30am, 2pm, and bedtime   MAGNESIUM 250 MG TABS    Take 250 mg by mouth daily at 2 PM.    METOPROLOL SUCCINATE (TOPROL XL) 25 MG 24 HR TABLET    Take 1 tablet (25 mg total) by mouth daily.   MULTIPLE VITAMIN (MULTIVITAMIN WITH MINERALS) TABS TABLET    Take 1 tablet by mouth daily at 2 PM.   MULTIPLE VITAMINS-MINERALS (PRESERVISION AREDS 2) CAPS    Take 1 capsule by mouth 2 (two) times daily.  Modified Medications   No medications on file  Discontinued Medications   No medications on file     Review of Systems  Constitutional: Positive for fatigue. Negative for activity change and appetite change.  HENT: Positive for voice change (chronic dysphonia).        History of goiter.  Eyes:       Cataracts  Respiratory: Positive for cough and shortness of breath.        Hx of Right pleural  effusion. Pneumonia in May 2016, Chronic hoarseness. Tender anterior chest wall. Splints resp with deep breath.  Cardiovascular: Negative for chest pain, palpitations and leg swelling.  Gastrointestinal:       Mild dysphagia and anorexia.  Endocrine: Negative.   Genitourinary: Positive for flank pain (right, associated with pleural effusion).  Musculoskeletal: Positive for back pain.       Pains radiate from low back down the right leg.   Skin: Negative.   Neurological:       Remote hx CVA.  Psychiatric/Behavioral: The patient is nervous/anxious.     Vitals:   10/12/15 1340  BP: (!) 160/84  Pulse: 67  Temp: 98.3 F (36.8 C)  TempSrc: Oral  SpO2: 97%  Weight: 110 lb (49.9 kg)  Height: 5' 2"  (1.575 m)   Wt Readings from Last 3 Encounters:  10/12/15 110 lb (49.9 kg)  09/28/15 109 lb (49.4 kg)  08/21/15 109 lb 6.4 oz (49.6 kg)    Body mass index is 20.12 kg/m.  Physical Exam  Constitutional: She is oriented to person, place, and time. She appears well-developed. No distress.  Thin  HENT:  Head: Normocephalic.  Loss of hearing  Eyes: Conjunctivae and  EOM are normal. Pupils are equal, round, and reactive to light.  Neck: Neck supple. No JVD present. No tracheal deviation present. Thyromegaly present.  Cardiovascular: Normal rate, regular rhythm, normal heart sounds and intact distal pulses.  Exam reveals no gallop and no friction rub.   No murmur heard. Bilateral mild varicose veins.  Pulmonary/Chest: No respiratory distress. She has no wheezes. She has rales (dry and bilateral).  Chronic hoarseness. Tender anterior chest and sternum.  Abdominal: Bowel sounds are normal. She exhibits no distension and no mass. There is no tenderness.  Musculoskeletal: Normal range of motion. She exhibits no edema or tenderness.  Bony sacrum and coccyx prominent  Lymphadenopathy:    She has no cervical adenopathy.  Neurological: She is alert and oriented to person, place, and time. She  has normal reflexes. No cranial nerve deficit. She exhibits normal muscle tone. Coordination normal.  Skin: No rash noted. No erythema. No pallor.  Psychiatric: She has a normal mood and affect. Her behavior is normal. Judgment and thought content normal.     Labs reviewed: Lab Summary Latest Ref Rng & Units 09/19/2015 06/01/2015 04/30/2015 04/29/2015  Hemoglobin 12.0 - 15.0 g/dL (None) (None) 11.1(L) 10.9(L)  Hematocrit 36.0 - 46.0 % (None) (None) 33.6(L) 33.7(L)  White count 4.0 - 10.5 K/uL (None) (None) 6.9 5.8  Platelet count 150 - 400 K/uL (None) (None) 244 237  Sodium 137 - 147 mmol/L 138 138 139 137  Potassium 3.4 - 5.3 mmol/L 4.9 4.5 4.2 4.1  Calcium 8.9 - 10.3 mg/dL (None) (None) 9.0 9.0  Phosphorus - (None) (None) (None) (None)  Creatinine 0.5 - 1.1 mg/dL 1.1 1.1 0.84 0.95  AST - (None) (None) (None) (None)  Alk Phos - (None) (None) (None) (None)  Bilirubin - (None) (None) (None) (None)  Glucose mg/dL 74 83 96 113(H)  Cholesterol 0 - 200 mg/dL 99 (None) (None) (None)  HDL cholesterol 35 - 70 mg/dL 55 (None) (None) (None)  Triglycerides 40 - 160 mg/dL 71 (None) (None) (None)  LDL Direct - (None) (None) (None) (None)  LDL Calc mg/dL 30 (None) (None) (None)  Total protein - (None) (None) (None) (None)  Albumin - (None) (None) (None) (None)  Some recent data might be hidden   Lab Results  Component Value Date   TSH 1.047 04/30/2015   Lab Results  Component Value Date   BUN 19 09/19/2015   BUN 17 04/30/2015   BUN 18 04/29/2015   Lab Results  Component Value Date   CREATININE 1.1 09/19/2015   CREATININE 1.1 06/01/2015   CREATININE 0.84 04/30/2015   Lab Results  Component Value Date   HGBA1C 5.7 (H) 07/25/2014       Assessment/Plan  1. Chest wall pain - DG Chest 2 View; Future

## 2015-10-20 ENCOUNTER — Encounter: Payer: Self-pay | Admitting: Internal Medicine

## 2015-11-09 ENCOUNTER — Encounter: Payer: Self-pay | Admitting: Internal Medicine

## 2015-11-09 ENCOUNTER — Non-Acute Institutional Stay: Payer: Medicare Other | Admitting: Internal Medicine

## 2015-11-09 VITALS — BP 122/64 | HR 60 | Temp 98.3°F | Ht 62.0 in | Wt 111.0 lb

## 2015-11-09 DIAGNOSIS — R0789 Other chest pain: Secondary | ICD-10-CM | POA: Diagnosis not present

## 2015-11-09 DIAGNOSIS — I10 Essential (primary) hypertension: Secondary | ICD-10-CM | POA: Diagnosis not present

## 2015-11-09 NOTE — Progress Notes (Signed)
Facility [facility]     Place of Service: Clinic (12)     Allergies  Allergen Reactions  . Doxycycline Itching  . Lipitor [Atorvastatin] Other (See Comments)    PAIN IN LEGS  . Pantoprazole Itching  . Pepcid [Famotidine] Itching    Chief Complaint  Patient presents with  . Medical Management of Chronic Issues    4-6 week follow-up on chest wall pain. Feels better    HPI:  Seen 10/12/15 for intense chest pain that followed heimlich maneuver for choking. CXR following that visit was OK and there was no evidence of rib fracture.  Feeling much better. No further choking episodes.  Medications: Patient's Medications  New Prescriptions   No medications on file  Previous Medications   ASPIRIN EC 81 MG TABLET    Take 81 mg by mouth at bedtime.   CHOLECALCIFEROL (VITAMIN D) 1000 UNITS TABLET    Take 1,000 Units by mouth daily.    CRESTOR 10 MG TABLET    TAKE 1 TABLET DAILY TO LOWER CHOLESTEROL.   DOCUSATE SODIUM (COLACE) 100 MG CAPSULE    Take 100 mg by mouth 3 (three) times daily. 7:30am, 2pm, and bedtime -to prevent constipation   HYDROCODONE-ACETAMINOPHEN (NORCO/VICODIN) 5-325 MG PER TABLET    Take 1 tablet by mouth 3 (three) times daily. 7:30am, 2pm, and bedtime   MAGNESIUM 250 MG TABS    Take 250 mg by mouth daily at 2 PM.    METOPROLOL SUCCINATE (TOPROL XL) 25 MG 24 HR TABLET    Take 1 tablet (25 mg total) by mouth daily.   MULTIPLE VITAMIN (MULTIVITAMIN WITH MINERALS) TABS TABLET    Take 1 tablet by mouth daily at 2 PM.   MULTIPLE VITAMINS-MINERALS (PRESERVISION AREDS 2) CAPS    Take 1 capsule by mouth 2 (two) times daily.  Modified Medications   No medications on file  Discontinued Medications   No medications on file     Review of Systems  Constitutional: Positive for fatigue. Negative for activity change and appetite change.  HENT: Positive for voice change (chronic dysphonia).        History of goiter.  Eyes:       Cataracts  Respiratory: Positive for cough and  shortness of breath.        Hx of Right pleural effusion. Pneumonia in May 2016, Chronic hoarseness. Tender anterior chest wall. Splints resp with deep breath.  Cardiovascular: Negative for chest pain, palpitations and leg swelling.  Gastrointestinal:       Mild dysphagia and anorexia.  Endocrine: Negative.   Genitourinary: Positive for flank pain (right, associated with pleural effusion).  Musculoskeletal: Positive for back pain.       Pains radiate from low back down the right leg.   Skin: Negative.   Neurological:       Remote hx CVA.  Psychiatric/Behavioral: The patient is nervous/anxious.     Vitals:   11/09/15 1339  BP: 122/64  Pulse: 60  Temp: 98.3 F (36.8 C)  TempSrc: Oral  SpO2: 96%  Weight: 111 lb (50.3 kg)  Height: 5' 2"  (1.575 m)   Wt Readings from Last 3 Encounters:  11/09/15 111 lb (50.3 kg)  10/12/15 110 lb (49.9 kg)  09/28/15 109 lb (49.4 kg)    Body mass index is 20.3 kg/m.  Physical Exam  Constitutional: She is oriented to person, place, and time. She appears well-developed. No distress.  Thin  HENT:  Head: Normocephalic.  Loss of hearing  Eyes: Conjunctivae and EOM are normal. Pupils are equal, round, and reactive to light.  Neck: Neck supple. No JVD present. No tracheal deviation present. Thyromegaly present.  Cardiovascular: Normal rate, regular rhythm, normal heart sounds and intact distal pulses.  Exam reveals no gallop and no friction rub.   No murmur heard. Bilateral mild varicose veins.  Pulmonary/Chest: No respiratory distress. She has no wheezes. She has rales (dry and bilateral).  Chronic hoarseness. Tender anterior chest and sternum.  Abdominal: Bowel sounds are normal. She exhibits no distension and no mass. There is no tenderness.  Musculoskeletal: Normal range of motion. She exhibits no edema or tenderness.  Bony sacrum and coccyx prominent  Lymphadenopathy:    She has no cervical adenopathy.  Neurological: She is alert and  oriented to person, place, and time. She has normal reflexes. No cranial nerve deficit. She exhibits normal muscle tone. Coordination normal.  Skin: No rash noted. No erythema. No pallor.  Psychiatric: She has a normal mood and affect. Her behavior is normal. Judgment and thought content normal.     Labs reviewed: Lab Summary Latest Ref Rng & Units 09/19/2015 06/01/2015 04/30/2015 04/29/2015  Hemoglobin 12.0 - 15.0 g/dL (None) (None) 11.1(L) 10.9(L)  Hematocrit 36.0 - 46.0 % (None) (None) 33.6(L) 33.7(L)  White count 4.0 - 10.5 K/uL (None) (None) 6.9 5.8  Platelet count 150 - 400 K/uL (None) (None) 244 237  Sodium 137 - 147 mmol/L 138 138 139 137  Potassium 3.4 - 5.3 mmol/L 4.9 4.5 4.2 4.1  Calcium 8.9 - 10.3 mg/dL (None) (None) 9.0 9.0  Phosphorus - (None) (None) (None) (None)  Creatinine 0.5 - 1.1 mg/dL 1.1 1.1 0.84 0.95  AST - (None) (None) (None) (None)  Alk Phos - (None) (None) (None) (None)  Bilirubin - (None) (None) (None) (None)  Glucose mg/dL 74 83 96 113(H)  Cholesterol 0 - 200 mg/dL 99 (None) (None) (None)  HDL cholesterol 35 - 70 mg/dL 55 (None) (None) (None)  Triglycerides 40 - 160 mg/dL 71 (None) (None) (None)  LDL Direct - (None) (None) (None) (None)  LDL Calc mg/dL 30 (None) (None) (None)  Total protein - (None) (None) (None) (None)  Albumin - (None) (None) (None) (None)  Some recent data might be hidden   Lab Results  Component Value Date   TSH 1.047 04/30/2015   Lab Results  Component Value Date   BUN 19 09/19/2015   BUN 17 04/30/2015   BUN 18 04/29/2015   Lab Results  Component Value Date   CREATININE 1.1 09/19/2015   CREATININE 1.1 06/01/2015   CREATININE 0.84 04/30/2015   Lab Results  Component Value Date   HGBA1C 5.7 (H) 07/25/2014   Dg Chest 2 View  Result Date: 10/12/2015 CLINICAL DATA:  Sternal chest pain, lower rib pain after Heimlich maneuver 10 days ago EXAM: CHEST  2 VIEW COMPARISON:  Chest x-ray of 04/29/2015 FINDINGS: No active infiltrate  or effusion is seen. No pneumothorax is noted. Mediastinal and hilar contours are unremarkable. The heart is within normal limits in size. Thoracolumbar scoliosis is present. No definite acute rib fracture is seen by chest x-ray. If further assessment is warranted consider rib detail films. IMPRESSION: 1. No active lung disease. 2. No rib fracture is seen by chest x-ray. Consider rib detail images if warranted. 3. Thoracolumbar scoliosis. Electronically Signed   By: Ivar Drape M.D.   On: 10/12/2015 14:56      Assessment/Plan  1. Chest wall pain resolved  2. Essential hypertension controlled

## 2015-11-22 DIAGNOSIS — H5211 Myopia, right eye: Secondary | ICD-10-CM | POA: Diagnosis not present

## 2015-11-22 DIAGNOSIS — Z9849 Cataract extraction status, unspecified eye: Secondary | ICD-10-CM | POA: Diagnosis not present

## 2016-01-30 DIAGNOSIS — Z79891 Long term (current) use of opiate analgesic: Secondary | ICD-10-CM | POA: Diagnosis not present

## 2016-01-30 DIAGNOSIS — M47816 Spondylosis without myelopathy or radiculopathy, lumbar region: Secondary | ICD-10-CM | POA: Diagnosis not present

## 2016-01-30 DIAGNOSIS — M47812 Spondylosis without myelopathy or radiculopathy, cervical region: Secondary | ICD-10-CM | POA: Diagnosis not present

## 2016-01-30 DIAGNOSIS — G894 Chronic pain syndrome: Secondary | ICD-10-CM | POA: Diagnosis not present

## 2016-03-18 ENCOUNTER — Other Ambulatory Visit: Payer: Self-pay

## 2016-03-18 DIAGNOSIS — I10 Essential (primary) hypertension: Secondary | ICD-10-CM | POA: Diagnosis not present

## 2016-03-18 DIAGNOSIS — E041 Nontoxic single thyroid nodule: Secondary | ICD-10-CM | POA: Diagnosis not present

## 2016-03-18 DIAGNOSIS — E785 Hyperlipidemia, unspecified: Secondary | ICD-10-CM | POA: Diagnosis not present

## 2016-03-18 NOTE — Addendum Note (Signed)
Addended by: Charna Elizabeth on: 03/18/2016 01:18 PM   Modules accepted: Orders

## 2016-03-19 LAB — COMPLETE METABOLIC PANEL WITH GFR
ALT: 10 U/L (ref 6–29)
AST: 18 U/L (ref 10–35)
Albumin: 3.8 g/dL (ref 3.6–5.1)
Alkaline Phosphatase: 53 U/L (ref 33–130)
BILIRUBIN TOTAL: 0.5 mg/dL (ref 0.2–1.2)
BUN: 15 mg/dL (ref 7–25)
CO2: 29 mmol/L (ref 20–31)
Calcium: 9.4 mg/dL (ref 8.6–10.4)
Chloride: 102 mmol/L (ref 98–110)
Creat: 1.18 mg/dL — ABNORMAL HIGH (ref 0.60–0.88)
GFR, EST NON AFRICAN AMERICAN: 41 mL/min — AB (ref >=60)
GFR, Est African American: 47 mL/min — ABNORMAL LOW (ref >=60)
GLUCOSE: 86 mg/dL (ref 65–99)
Potassium: 4.6 mmol/L (ref 3.5–5.3)
SODIUM: 140 mmol/L (ref 135–146)
TOTAL PROTEIN: 6.7 g/dL (ref 6.1–8.1)

## 2016-03-19 LAB — LIPID PANEL
CHOL/HDL RATIO: 2.1 ratio (ref <5.0)
Cholesterol: 90 mg/dL (ref <200)
HDL: 42 mg/dL — AB (ref >50)
LDL CALC: 29 mg/dL (ref <100)
Triglycerides: 97 mg/dL (ref <150)
VLDL: 19 mg/dL (ref <30)

## 2016-03-19 LAB — TSH: TSH: 1.21 m[IU]/L

## 2016-03-28 ENCOUNTER — Encounter: Payer: Self-pay | Admitting: Internal Medicine

## 2016-05-09 ENCOUNTER — Encounter: Payer: Self-pay | Admitting: Internal Medicine

## 2016-05-09 ENCOUNTER — Non-Acute Institutional Stay: Payer: Medicare Other | Admitting: Internal Medicine

## 2016-05-09 VITALS — BP 120/72 | HR 71 | Temp 98.5°F | Ht 62.0 in | Wt 114.0 lb

## 2016-05-09 DIAGNOSIS — Z9181 History of falling: Secondary | ICD-10-CM

## 2016-05-09 DIAGNOSIS — R739 Hyperglycemia, unspecified: Secondary | ICD-10-CM

## 2016-05-09 DIAGNOSIS — R634 Abnormal weight loss: Secondary | ICD-10-CM | POA: Diagnosis not present

## 2016-05-09 DIAGNOSIS — I10 Essential (primary) hypertension: Secondary | ICD-10-CM | POA: Diagnosis not present

## 2016-05-09 DIAGNOSIS — E785 Hyperlipidemia, unspecified: Secondary | ICD-10-CM | POA: Diagnosis not present

## 2016-05-09 DIAGNOSIS — E041 Nontoxic single thyroid nodule: Secondary | ICD-10-CM

## 2016-05-09 NOTE — Progress Notes (Signed)
Facility  [facility]    Place of Service: Clinic (12)     Allergies  Allergen Reactions  . Doxycycline Itching  . Lipitor [Atorvastatin] Other (See Comments)    PAIN IN LEGS  . Pantoprazole Itching  . Pepcid [Famotidine] Itching    Chief Complaint  Patient presents with  . Annual Exam    physical  . Medical Management of Chronic Issues    blood pressure, hyperglycemia, cholesterol. Labs  done in January   . MMSE    29/30 failed clock drawing    HPI:  Essential hypertension - controlled  Loss of weight - regaining   Hyperlipidemia, unspecified hyperlipidemia type - controlled  Hyperglycemia - controlled  Nontoxic uninodular goiter - unchanged  Personal history of fall - now using a rolling walkeer   Medications: Patient's Medications  New Prescriptions   No medications on file  Previous Medications   ASPIRIN EC 81 MG TABLET    Take 81 mg by mouth at bedtime.   CHOLECALCIFEROL (VITAMIN D) 1000 UNITS TABLET    Take 1,000 Units by mouth daily.    CRESTOR 10 MG TABLET    TAKE 1 TABLET DAILY TO LOWER CHOLESTEROL.   DOCUSATE SODIUM (COLACE) 100 MG CAPSULE    Take 100 mg by mouth 3 (three) times daily. 7:30am, 2pm, and bedtime -to prevent constipation   HYDROCODONE-ACETAMINOPHEN (NORCO/VICODIN) 5-325 MG PER TABLET    Take 1 tablet by mouth 3 (three) times daily. 7:30am, 2pm, and bedtime   MAGNESIUM 250 MG TABS    Take 250 mg by mouth daily at 2 PM.    METOPROLOL SUCCINATE (TOPROL XL) 25 MG 24 HR TABLET    Take 1 tablet (25 mg total) by mouth daily.   MULTIPLE VITAMIN (MULTIVITAMIN WITH MINERALS) TABS TABLET    Take 1 tablet by mouth daily at 2 PM.   MULTIPLE VITAMINS-MINERALS (PRESERVISION AREDS 2) CAPS    Take 1 capsule by mouth 2 (two) times daily.  Modified Medications   No medications on file  Discontinued Medications   No medications on file     Review of Systems  Constitutional: Positive for fatigue. Negative for activity change and appetite change.  HENT:  Positive for voice change (chronic dysphonia).        History of goiter.  Eyes:       Cataracts  Respiratory: Positive for cough and shortness of breath.        Hx of Right pleural effusion. Pneumonia in May 2016, Chronic hoarseness. Tender anterior chest wall. Splints resp with deep breath.  Cardiovascular: Negative for chest pain, palpitations and leg swelling.  Gastrointestinal:       Mild dysphagia and anorexia.  Endocrine: Negative.   Genitourinary: Negative for flank pain.  Musculoskeletal: Positive for back pain and gait problem (unstable, hx fall, using walker).       Pains radiate from low back down the right leg.   Skin: Negative.   Neurological:       Remote hx CVA.  Psychiatric/Behavioral: The patient is nervous/anxious.     Vitals:   05/09/16 1432  BP: 120/72  Pulse: 71  Temp: 98.5 F (36.9 C)  TempSrc: Oral  SpO2: 97%  Weight: 114 lb (51.7 kg)  Height: 5' 2" (1.575 m)   Wt Readings from Last 3 Encounters:  05/09/16 114 lb (51.7 kg)  11/09/15 111 lb (50.3 kg)  10/12/15 110 lb (49.9 kg)    Body mass index is 20.85 kg/m.  Physical  Exam  Constitutional: She is oriented to person, place, and time. She appears well-developed. No distress.  Thin  HENT:  Head: Normocephalic.  Loss of hearing  Eyes: Conjunctivae and EOM are normal. Pupils are equal, round, and reactive to light.  Neck: Neck supple. No JVD present. No tracheal deviation present. Thyromegaly present.  Cardiovascular: Normal rate, regular rhythm, normal heart sounds and intact distal pulses.  Exam reveals no gallop and no friction rub.   No murmur heard. Bilateral mild varicose veins.  Pulmonary/Chest: No respiratory distress. She has no wheezes. She has rales (dry and bilateral).  Chronic hoarseness. Tender anterior chest and sternum.  Abdominal: Bowel sounds are normal. She exhibits no distension and no mass. There is no tenderness.  Musculoskeletal: Normal range of motion. She exhibits no  edema or tenderness.  Bony sacrum and coccyx prominent  Lymphadenopathy:    She has no cervical adenopathy.  Neurological: She is alert and oriented to person, place, and time. She has normal reflexes. No cranial nerve deficit. She exhibits normal muscle tone. Coordination normal.  05/09/16 MMSE 29/30. Failed clock drawing. She put the numbers on the clock in reverse.  Skin: No rash noted. No erythema. No pallor.  Psychiatric: She has a normal mood and affect. Her behavior is normal. Judgment and thought content normal.     Labs reviewed: Lab Summary Latest Ref Rng & Units 03/18/2016 09/19/2015 06/01/2015 04/30/2015 04/29/2015  Hemoglobin 12.0 - 15.0 g/dL (None) (None) (None) 11.1(L) 10.9(L)  Hematocrit 36.0 - 46.0 % (None) (None) (None) 33.6(L) 33.7(L)  White count 4.0 - 10.5 K/uL (None) (None) (None) 6.9 5.8  Platelet count 150 - 400 K/uL (None) (None) (None) 244 237  Sodium 135 - 146 mmol/L 140 138 138 139 137  Potassium 3.5 - 5.3 mmol/L 4.6 4.9 4.5 4.2 4.1  Calcium 8.6 - 10.4 mg/dL 9.4 (None) (None) 9.0 9.0  Phosphorus - (None) (None) (None) (None) (None)  Creatinine 0.60 - 0.88 mg/dL 1.18(H) 1.1 1.1 0.84 0.95  AST 10 - 35 U/L 18 (None) (None) (None) (None)  Alk Phos 33 - 130 U/L 53 (None) (None) (None) (None)  Bilirubin 0.2 - 1.2 mg/dL 0.5 (None) (None) (None) (None)  Glucose 65 - 99 mg/dL 86 74 83 96 113(H)  Cholesterol <200 mg/dL 90 99 (None) (None) (None)  HDL cholesterol >50 mg/dL 42(L) 55 (None) (None) (None)  Triglycerides <150 mg/dL 97 71 (None) (None) (None)  LDL Direct - (None) (None) (None) (None) (None)  LDL Calc <100 mg/dL 29 30 (None) (None) (None)  Total protein 6.1 - 8.1 g/dL 6.7 (None) (None) (None) (None)  Albumin 3.6 - 5.1 g/dL 3.8 (None) (None) (None) (None)  Some recent data might be hidden   Lab Results  Component Value Date   TSH 1.21 03/18/2016   Lab Results  Component Value Date   BUN 15 03/18/2016   BUN 19 09/19/2015   BUN 17 04/30/2015   Lab Results   Component Value Date   CREATININE 1.18 (H) 03/18/2016   CREATININE 1.1 09/19/2015   CREATININE 1.1 06/01/2015   Lab Results  Component Value Date   HGBA1C 5.7 (H) 07/25/2014    Assessment/Plan  1. Essential hypertension controlled  2. Loss of weight regaining weight  3. Hyperlipidemia, unspecified hyperlipidemia type controlled  4. Hyperglycemia controlled  5. Nontoxic uninodular goiter unchanged  6. Personal history of fall continue to use walker.

## 2016-05-27 DIAGNOSIS — G894 Chronic pain syndrome: Secondary | ICD-10-CM | POA: Diagnosis not present

## 2016-05-27 DIAGNOSIS — M47812 Spondylosis without myelopathy or radiculopathy, cervical region: Secondary | ICD-10-CM | POA: Diagnosis not present

## 2016-05-27 DIAGNOSIS — M47816 Spondylosis without myelopathy or radiculopathy, lumbar region: Secondary | ICD-10-CM | POA: Diagnosis not present

## 2016-05-27 DIAGNOSIS — Z79891 Long term (current) use of opiate analgesic: Secondary | ICD-10-CM | POA: Diagnosis not present

## 2016-08-13 DIAGNOSIS — H353132 Nonexudative age-related macular degeneration, bilateral, intermediate dry stage: Secondary | ICD-10-CM | POA: Diagnosis not present

## 2016-08-13 DIAGNOSIS — Z961 Presence of intraocular lens: Secondary | ICD-10-CM | POA: Diagnosis not present

## 2016-08-13 DIAGNOSIS — H43813 Vitreous degeneration, bilateral: Secondary | ICD-10-CM | POA: Diagnosis not present

## 2016-08-13 DIAGNOSIS — H04123 Dry eye syndrome of bilateral lacrimal glands: Secondary | ICD-10-CM | POA: Diagnosis not present

## 2016-08-13 DIAGNOSIS — H26491 Other secondary cataract, right eye: Secondary | ICD-10-CM | POA: Diagnosis not present

## 2016-08-15 ENCOUNTER — Encounter: Payer: Self-pay | Admitting: Nurse Practitioner

## 2016-08-20 ENCOUNTER — Encounter: Payer: Self-pay | Admitting: Internal Medicine

## 2016-08-21 ENCOUNTER — Encounter (INDEPENDENT_AMBULATORY_CARE_PROVIDER_SITE_OTHER): Payer: Medicare Other | Admitting: Ophthalmology

## 2016-08-21 DIAGNOSIS — H35033 Hypertensive retinopathy, bilateral: Secondary | ICD-10-CM | POA: Diagnosis not present

## 2016-08-21 DIAGNOSIS — H43813 Vitreous degeneration, bilateral: Secondary | ICD-10-CM

## 2016-08-21 DIAGNOSIS — H353132 Nonexudative age-related macular degeneration, bilateral, intermediate dry stage: Secondary | ICD-10-CM | POA: Diagnosis not present

## 2016-08-21 DIAGNOSIS — H33303 Unspecified retinal break, bilateral: Secondary | ICD-10-CM

## 2016-08-21 DIAGNOSIS — I10 Essential (primary) hypertension: Secondary | ICD-10-CM | POA: Diagnosis not present

## 2016-08-25 NOTE — Progress Notes (Signed)
Cardiology Office Note   Date:  08/26/2016   ID:  [name], DOB April 18, 1924, MRN [id number]  PCP:  Kimber Relic, MD  Cardiologist:   Chilton Si, MD   No chief complaint on file.    History of Present Illness: [name] is an 83 y.o. female with hypertension, hyperlipidemia, recurrent pleural effusions, PACs and PVCs who presents for follow up on arrhythmia.  [name] was initially evaluated on 05/2015 after being seen in the ED with a complaint of palpitations and pre-syncope.  In the ED telemetry showed sinus rhythm and sinus tachycardia up to the 120s.  Cardiac enzymes were negative and echo revealed LVEF 55-60% and grade 1 diastolic dysfunction.  TSH was normal. By the time she followed up in our office she denied recurrent symptoms.  Amlodipine was discontinued and she was started on metoprolol.  She followed up 08/2015 and was doing well.    [name] Has been doing well. She denies any recurrent palpitations. She also has not had any chest pain or shortness of breath. She walks daily for exercise. She has no exertional symptoms. She denies lower extremity edema, orthopnea, or PND. In the past she's required thoracentesis for recurrent pleural effusions. She's changed her diet and has not had any thoracenteses in the last 2 years.  [name] lives at [facility] and has many friends there.     Past Medical History:  Diagnosis Date  . Acute upper respiratory infections of unspecified site 2013  . Cellulitis and abscess of hand, except fingers and thumb 10/04/2010  . Degeneration of intervertebral disc, site unspecified 2000  . Diaphragmatic hernia without mention of obstruction or gangrene 08/15/1998  . Diverticulosis of colon (without mention of hemorrhage) 2000  . Hearing loss 09/28/2015  . Hyperlipidemia 2007  . Hypertension 2007  . Impacted cerumen   . Loss of weight 04/25/2011  . Myalgia and myositis, unspecified 01/07/2007  . Nontoxic uninodular  goiter 08/15/1998  . Osteoarthrosis, unspecified whether generalized or localized, unspecified site 07/22/2000  . Osteoporosis 2000  . Other drug allergy(995.27) 04/04/2011  . Pain in limb 2012  . Personal history of fall 2012  . Pulmonary hypertension (HCC) 07/25/14   Mild-moderate  . Spinal stenosis, unspecified region other than cervical 2002  . Sprain of ankle, left   . Supraventricular premature beats 10/2006  . Tricuspid regurgitation 07/25/14   Mild-moderate  . 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 Urinary tract infection, site not specified   . Urine frequency 01/20/2014  . Urticaria, unspecified 11/01/2008  . Vitamin D deficiency 04/25/2011    Past Surgical History:  Procedure Laterality Date  . BACK SURGERY  1990   HNP L3-4 Dr. Lynnette Caffey  . CATARACT EXTRACTION W/ INTRAOCULAR LENS IMPLANT Right 05/19/2014   Dr. Dione Booze  . COLONOSCOPY  1992   acute segmental colitis Dr. Russella Dar  . FACIAL COSMETIC SURGERY  1995   Dr. Charolotte Eke  . LARYNGOSCOPY  1987   and biopsy  Dr. Haroldine Laws     Current Outpatient Prescriptions  Medication Sig Dispense Refill  . aspirin EC 81 MG tablet Take 81 mg by mouth at bedtime.    . cholecalciferol (VITAMIN D) 1000 UNITS tablet Take 1,000 Units by mouth daily.     . CRESTOR 10 MG tablet TAKE 1 TABLET DAILY TO LOWER CHOLESTEROL. 90 tablet 3  . docusate sodium (COLACE) 100 MG capsule Take 100 mg by mouth 3 (three) times daily. 7:30am,  2pm, and bedtime -to prevent constipation    . HYDROcodone-acetaminophen (NORCO/VICODIN) 5-325 MG per tablet Take 1 tablet by mouth 3 (three) times daily. 7:30am, 2pm, and bedtime    . Magnesium 250 MG TABS Take 250 mg by mouth daily at 2 PM.     . metoprolol succinate (TOPROL XL) 25 MG 24 hr tablet Take 1 tablet (25 mg total) by mouth daily. 90 tablet 3  . Multiple Vitamin (MULTIVITAMIN WITH MINERALS) TABS tablet Take 1 tablet by mouth daily at 2 PM.    . Multiple Vitamins-Minerals (PRESERVISION  AREDS 2) CAPS Take 1 capsule by mouth 2 (two) times daily.     No current facility-administered medications for this visit.     Allergies:   Doxycycline; Lipitor [atorvastatin]; Pantoprazole; and Pepcid [famotidine]    Social History:  The patient  reports that she has never smoked. She has never used smokeless tobacco. She reports that she does not drink alcohol or use drugs.   Family History:  The patient's family history includes Hypertension in her brother; Pancreatic cancer in her sister; Rheum arthritis in her maternal grandmother; Stroke in her father and mother.    ROS:  Please see the history of present illness.   Otherwise, review of systems are positive for none.   All other systems are reviewed and negative.    PHYSICAL EXAM: VS:  BP 138/72   Pulse 62   Ht 5\' 2"  (1.575 m)   Wt 52.4 kg (115 lb 9.6 oz)   BMI 21.14 kg/m  , BMI Body mass index is 21.14 kg/m. GENERAL:  Well appearing.  No acute distress. HEENT:  Pupils equal round and reactive, fundi not visualized, oral mucosa unremarkable NECK:  No jugular venous distention, waveform within normal limits, carotid upstroke brisk and symmetric, no bruits LUNGS:  Clear to auscultation bilaterally.  No crackles, wheezes, or rhonchi. HEART:  RRR.  PMI not displaced or sustained,S1 and S2 within normal limits, no S3, no S4, no clicks, no rubs, no murmurs ABD:  Flat, positive bowel sounds normal in frequency in pitch, no bruits, no rebound, no guarding, no midline pulsatile mass, no hepatomegaly, no splenomegaly EXT:  2 plus pulses throughout, no edema, no cyanosis no clubbing SKIN:  No rashes no nodules NEURO:  Cranial nerves II through XII grossly intact, motor grossly intact throughout PSYCH:  Cognitively intact, oriented to person place and time   EKG:  EKG is ordered today. The ekg ordered 08/21/15 demonstrates sinus rhythm. Rate 61 bpm. 08/26/16: Sinus rhythm. Rate 62 bpm. Low voltage limb leads and precordial  leads.  Echo 05/01/15: Study Conclusions  - Left ventricle: The cavity size was normal. Systolic function was  normal. The estimated ejection fraction was in the range of 55%  to 60%. Wall motion was normal; there were no regional wall  motion abnormalities. There was an increased relative  contribution of atrial contraction to ventricular filling.  Doppler parameters are consistent with abnormal left ventricular  relaxation (grade 1 diastolic dysfunction). - Aortic valve: Trileaflet; mildly thickened, mildly calcified  leaflets. - Mitral valve: Calcified annulus. There was mild regurgitation. - Tricuspid valve: There was mild regurgitation.   Recent Labs: 03/18/2016: ALT 10; BUN 15; Creat 1.18; Potassium 4.6; Sodium 140; TSH 1.21    Lipid Panel    Component Value Date/Time   CHOL 90 03/18/2016 0001   TRIG 97 03/18/2016 0001   HDL 42 (L) 03/18/2016 0001   CHOLHDL 2.1 03/18/2016 0001   VLDL 19 03/18/2016  0001   LDLCALC 29 03/18/2016 0001      Wt Readings from Last 3 Encounters:  08/26/16 52.4 kg (115 lb 9.6 oz)  05/09/16 51.7 kg (114 lb)  11/09/15 50.3 kg (111 lb)      ASSESSMENT AND PLAN:  # Tachycardia, near syncope: No recurrent symptoms. Continue metoprolol.  # Hypertension: Blood pressure is slightly above her goal of 130/80. However given her age we will not make any changes at this time.  Continue metoprolol.  # Hyperlipidemia: LDL was 29 03/2016. Continue Rosuvastatin.   Current medicines are reviewed at length with the patient today.  The patient does not have concerns regarding medicines.  The following changes have been made:  none  Labs/ tests ordered today include:   No orders of the defined types were placed in this encounter.    Disposition:   FU with Shekela Goodridge C. Duke Salvia, MD, [facility] as needed.   This note was written with the assistance of speech recognition software.  Please excuse any transcriptional errors.  Signed, Candelario Steppe C.  Duke Salvia, MD, [facility]  08/26/2016 9:20 AM    [facility]

## 2016-08-26 ENCOUNTER — Encounter: Payer: Self-pay | Admitting: Cardiovascular Disease

## 2016-08-26 ENCOUNTER — Ambulatory Visit (INDEPENDENT_AMBULATORY_CARE_PROVIDER_SITE_OTHER): Payer: Medicare Other | Admitting: Cardiovascular Disease

## 2016-08-26 VITALS — BP 138/72 | HR 62 | Ht 62.0 in | Wt 115.6 lb

## 2016-08-26 DIAGNOSIS — E7800 Pure hypercholesterolemia, unspecified: Secondary | ICD-10-CM | POA: Diagnosis not present

## 2016-08-26 DIAGNOSIS — R000 Tachycardia, unspecified: Secondary | ICD-10-CM

## 2016-08-26 DIAGNOSIS — R002 Palpitations: Secondary | ICD-10-CM

## 2016-08-26 DIAGNOSIS — I10 Essential (primary) hypertension: Secondary | ICD-10-CM | POA: Diagnosis not present

## 2016-08-26 NOTE — Patient Instructions (Signed)
Medication Instructions:  ?Your physician recommends that you continue on your current medications as directed. Please refer to the Current Medication list given to you today.  ? ?Labwork: ?NONE ? ?Testing/Procedures: ?NONE ? ?Follow-Up: ?AS NEEDED  ? ?  ?

## 2016-09-04 NOTE — Addendum Note (Signed)
Addended by: Josph Macho A on: 09/04/2016 04:05 PM   Modules accepted: Orders

## 2016-09-05 ENCOUNTER — Other Ambulatory Visit: Payer: Self-pay | Admitting: Cardiovascular Disease

## 2016-09-05 ENCOUNTER — Telehealth: Payer: Self-pay | Admitting: Cardiovascular Disease

## 2016-09-05 ENCOUNTER — Other Ambulatory Visit: Payer: Self-pay | Admitting: Internal Medicine

## 2016-09-05 DIAGNOSIS — I10 Essential (primary) hypertension: Secondary | ICD-10-CM

## 2016-09-05 MED ORDER — METOPROLOL SUCCINATE ER 25 MG PO TB24: 25.0000 mg | ORAL_TABLET | Freq: Every day | ORAL | 1 refills | Status: DC

## 2016-09-05 NOTE — Telephone Encounter (Signed)
Please review for refill, Thanks !  

## 2016-09-05 NOTE — Telephone Encounter (Signed)
Pt wants to know if Dr Duke Salvia wants her to continue taking the Metoprolol? If so,she will need a new prescription.Please let her know asap

## 2016-09-05 NOTE — Telephone Encounter (Signed)
Returned call. Pt seen on 6/18 by Dr. Duke Salvia, w instruction to f/u as needed. Informed pt that continuation of metoprolol at same dose is advised,  we could send metoprolol rx & refill in as she was seen recently, advised to get further refills through PCP and follow up w [place] if new concerns. Pt expressed understanding and thanks.

## 2016-09-09 DIAGNOSIS — H26492 Other secondary cataract, left eye: Secondary | ICD-10-CM | POA: Diagnosis not present

## 2016-10-01 ENCOUNTER — Ambulatory Visit (INDEPENDENT_AMBULATORY_CARE_PROVIDER_SITE_OTHER): Payer: Self-pay | Admitting: Ophthalmology

## 2016-10-18 NOTE — Addendum Note (Signed)
Addended by: Josph Macho A on: 10/18/2016 03:37 PM   Modules accepted: Orders

## 2016-11-13 DIAGNOSIS — G894 Chronic pain syndrome: Secondary | ICD-10-CM | POA: Diagnosis not present

## 2016-11-13 DIAGNOSIS — M47812 Spondylosis without myelopathy or radiculopathy, cervical region: Secondary | ICD-10-CM | POA: Diagnosis not present

## 2016-11-13 DIAGNOSIS — M47816 Spondylosis without myelopathy or radiculopathy, lumbar region: Secondary | ICD-10-CM | POA: Diagnosis not present

## 2016-11-18 IMAGING — US US THORACENTESIS ASP PLEURAL SPACE W/IMG GUIDE
1 series · 4 of 4 positions shown · non-contrast
Comparison: Previous exams.

CLINICAL DATA: Pleural effusion

EXAM:
ULTRASOUND GUIDED RIGHT THORACENTESIS

[Series 1: us thoracentesis asp pleural space w/img guide · 0.24mm/px · 4 of 4 slices shown]
[im 1/4]
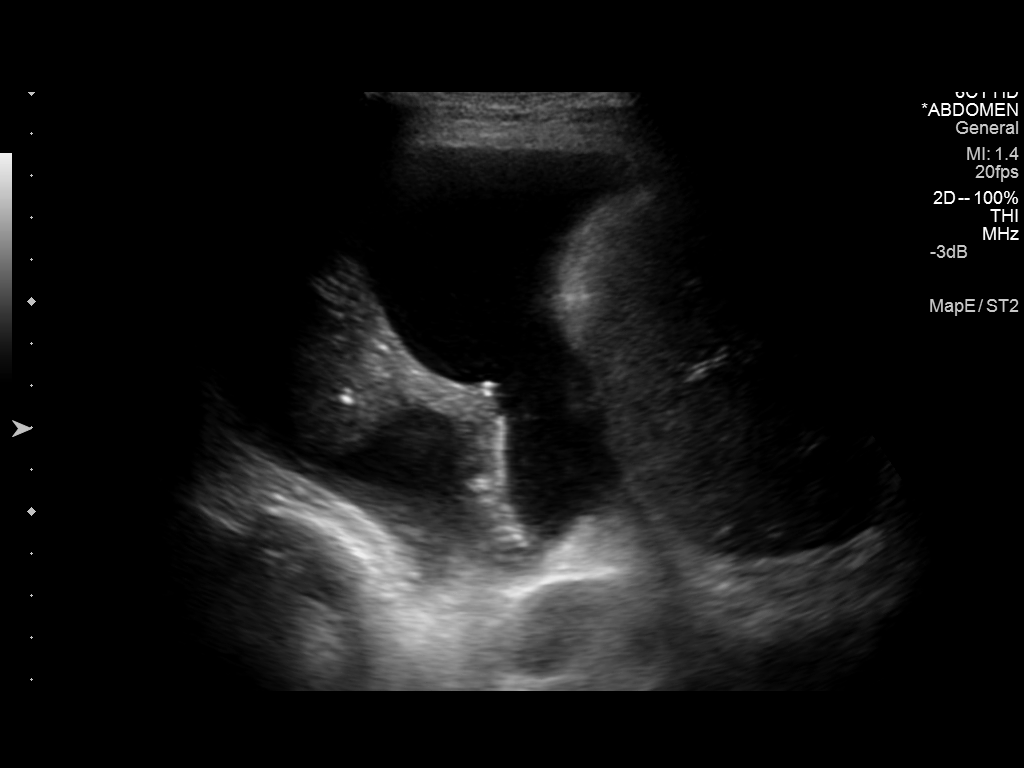
[im 2/4]
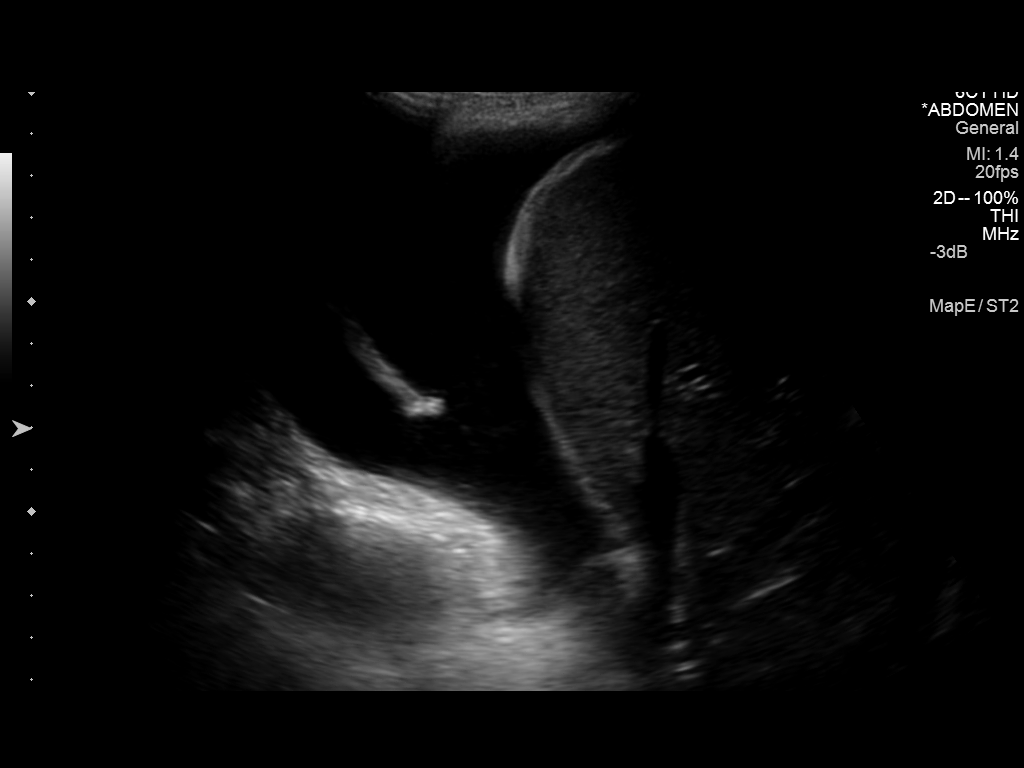
[im 3/4]
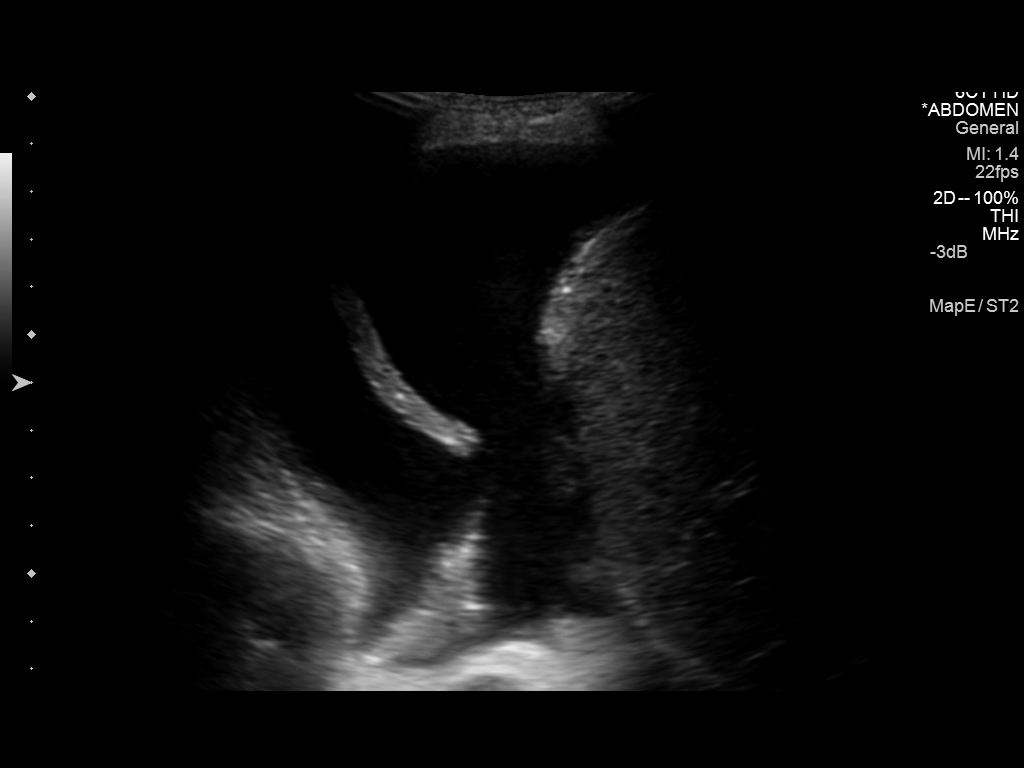
[im 4/4]
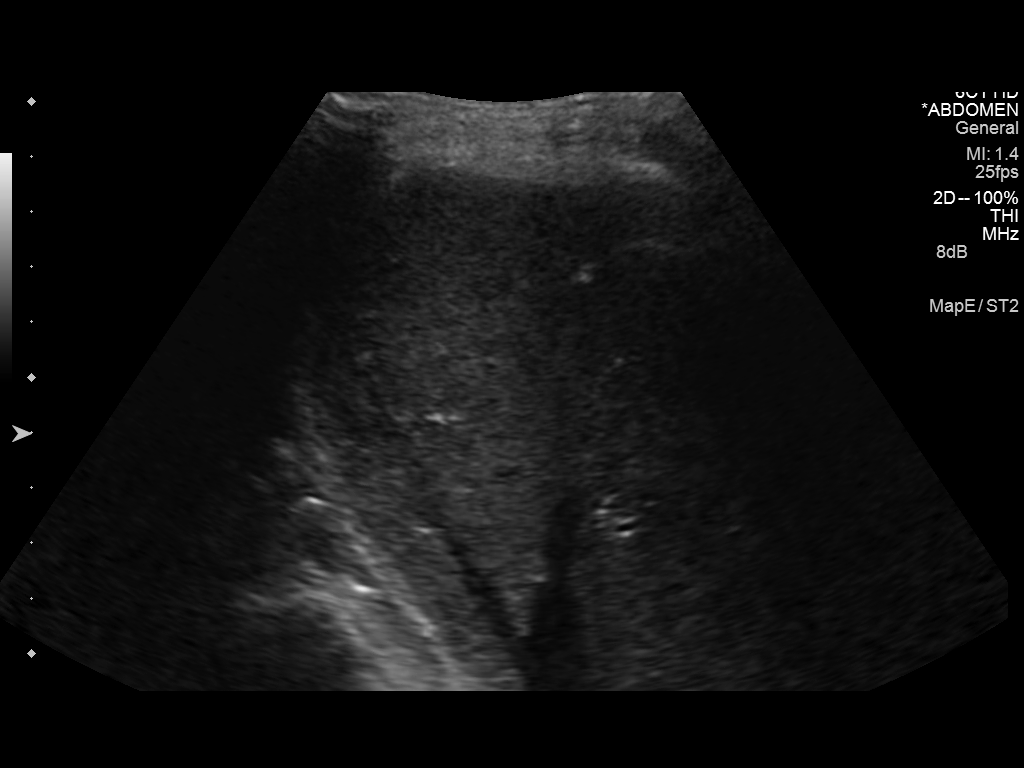

[4 of 4 positions shown; findings below may reference images not displayed]

PROCEDURE:
Using sterile technique, 8 mls 1% lidocaine was used to anesthetize
the area, under direct ultrasound visualization, A Yeuh catheter was
used to aspirate 620 mls of clear yellow fluid from the right
pleural space. Fluid was sent for labs.

Patient tolerated procedure well.
IMPRESSION: Ultrasound-guided Right Thoracentesis.  No apparent complications.

RECOMMENDATIONS:
Will check Chest Xray.

## 2016-11-18 IMAGING — DX DG CHEST 1V
1 series · 1 of 1 positions shown · non-contrast
Comparison: 07/24/2014

CLINICAL DATA: Status post right thoracentesis

EXAM:
CHEST  1 VIEW

[chest ap]
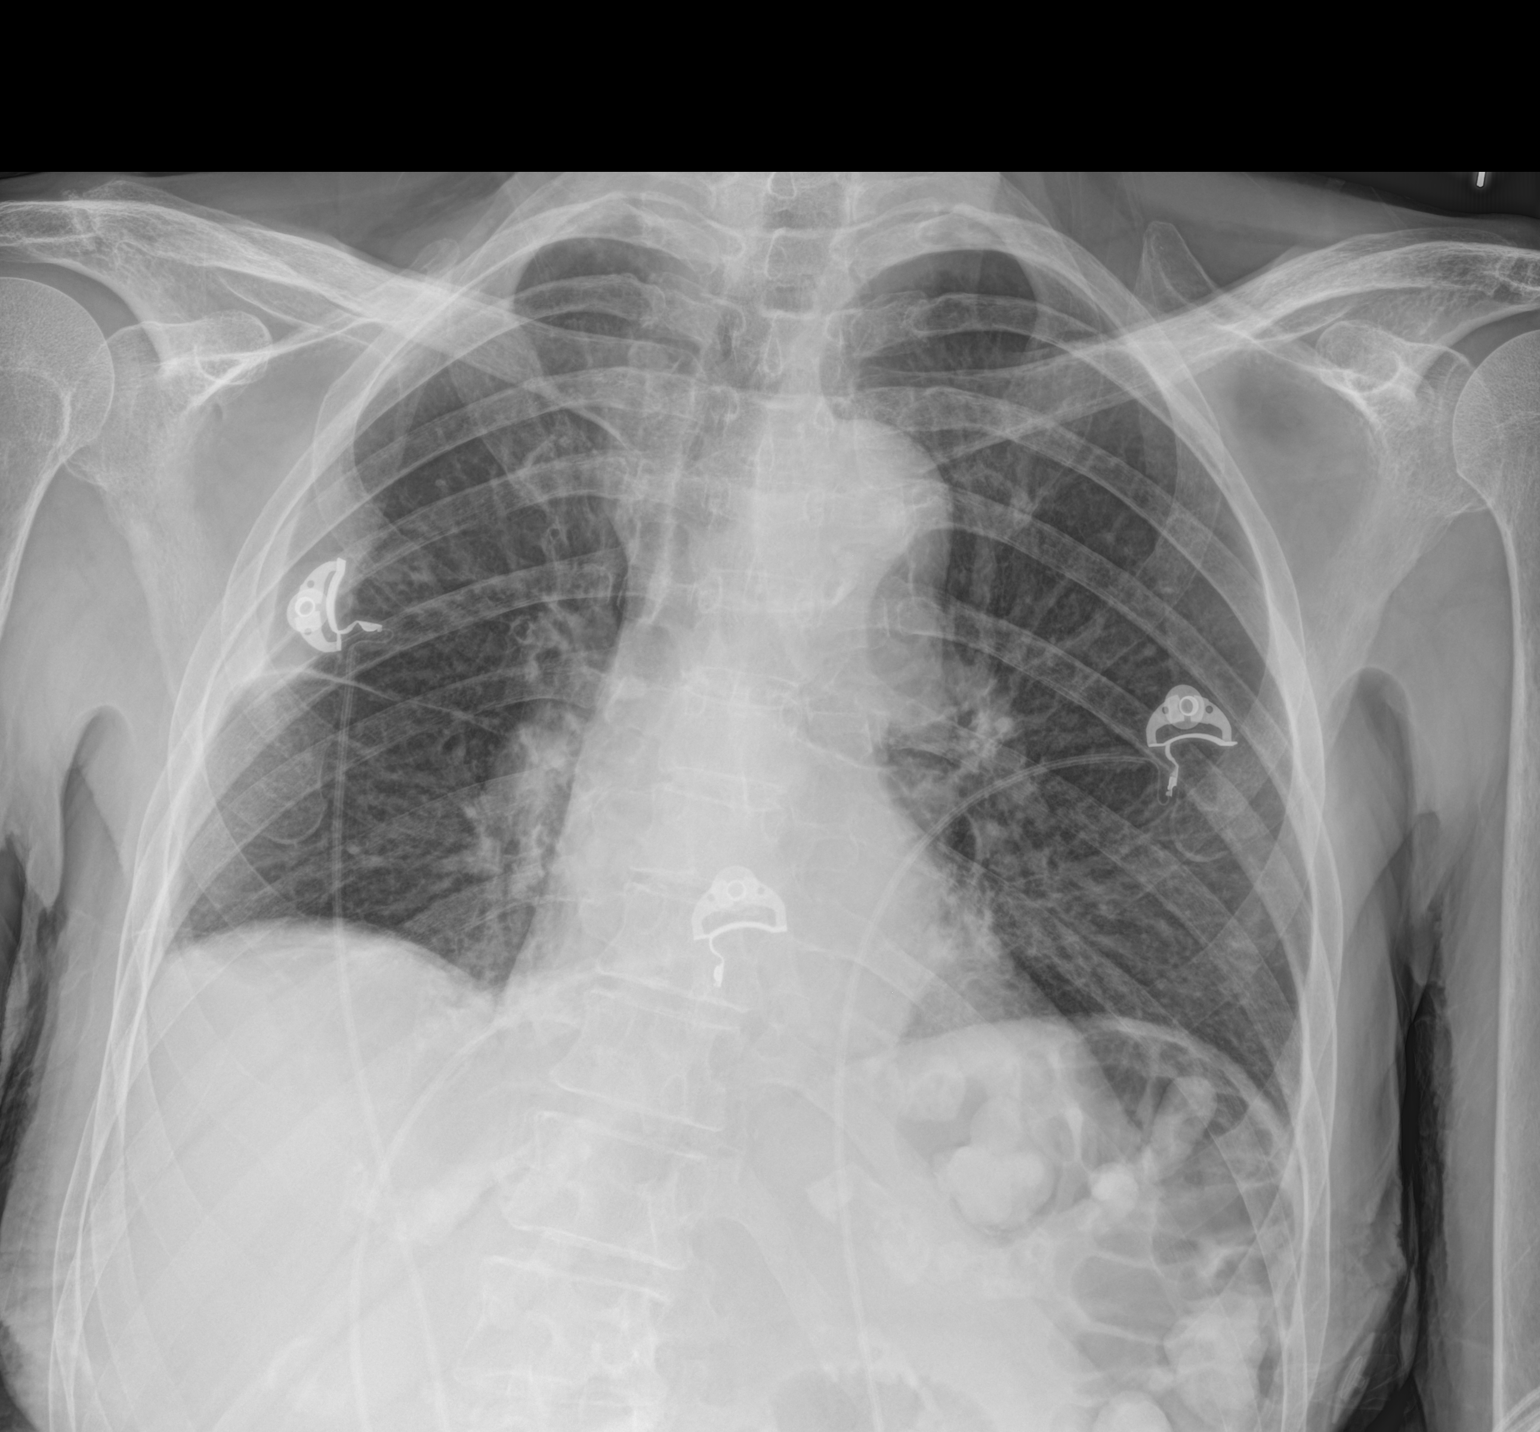

[1 of 1 positions shown; findings below may reference images not displayed]

FINDINGS: There is been interval right-sided thoracentesis with near complete
resolution of right-sided pleural fluid. No pneumothorax is seen.
The left lung remains clear. The cardiac shadow is stable.
IMPRESSION: No pneumothorax following right thoracentesis.

## 2016-11-18 IMAGING — CT CT ANGIO CHEST
3 of 13 series · 13 of 38 positions shown · IV contrast (OMNIPAQUE)
Comparison: Chest radiograph 1 day prior.

CLINICAL DATA: 89-year-old female with chest pain and abdominal
pain. Fever, generalized weakness, chills and nonproductive cough.

EXAM:
CT ANGIOGRAPHY CHEST
CT ABDOMEN AND PELVIS WITH CONTRAST
TECHNIQUE: Multidetector CT imaging of the chest was performed using the
standard protocol during bolus administration of intravenous
contrast. Multiplanar CT image reconstructions and MIPs were
obtained to evaluate the vascular anatomy. Multidetector CT imaging
of the abdomen and pelvis was performed using the standard protocol
during bolus administration of intravenous contrast.
CONTRAST:  100mL OMNIPAQUE IOHEXOL 350 MG/ML SOLN

[Series 4: pe @ 3mm · axial · 0.63mm/px · z∈[+1186,+1306]mm · 3 of 82 slices shown]
[im 21/82  lung]
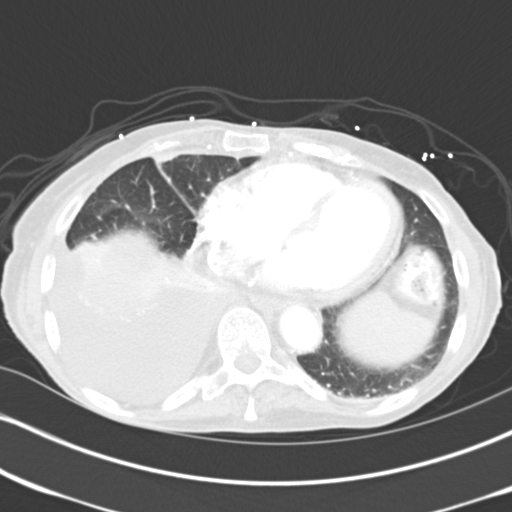
[im 41/82  lung]
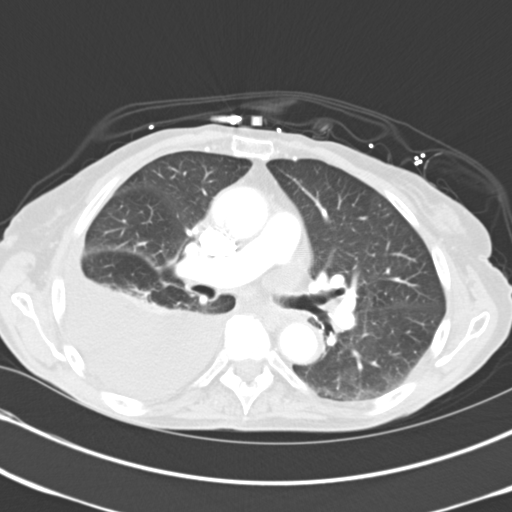
[im 61/82  lung]
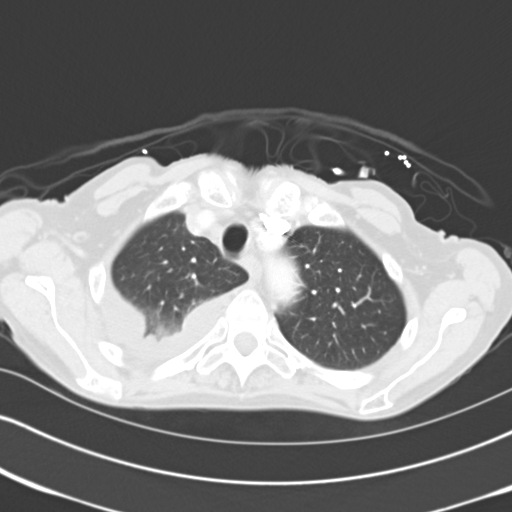

[Series 6: pe thins @ 1mm · axial · 0.63mm/px · z∈[+1142,+1352]mm · 8 of 246 slices shown]
[im 18/246  lung]
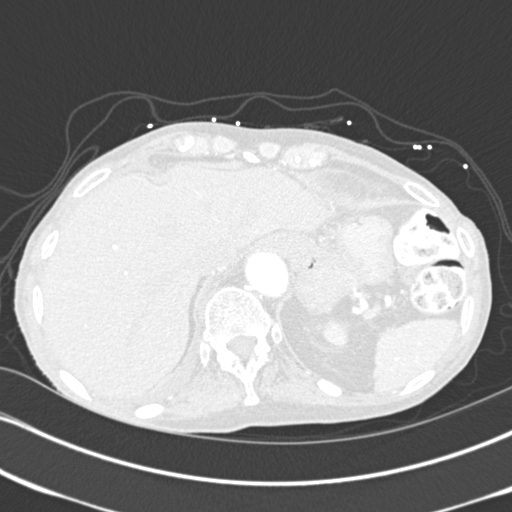
[im 53/246  lung]
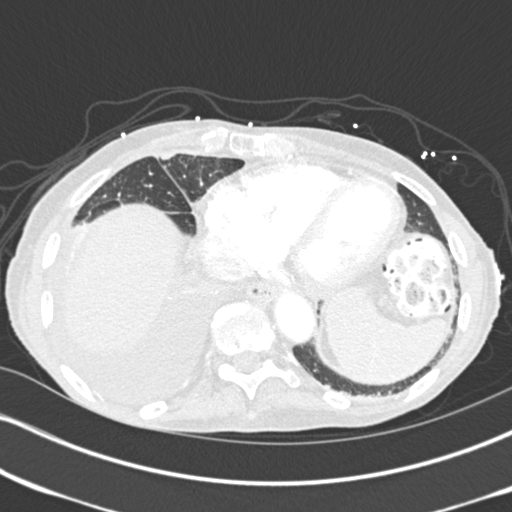
[im 88/246  lung]
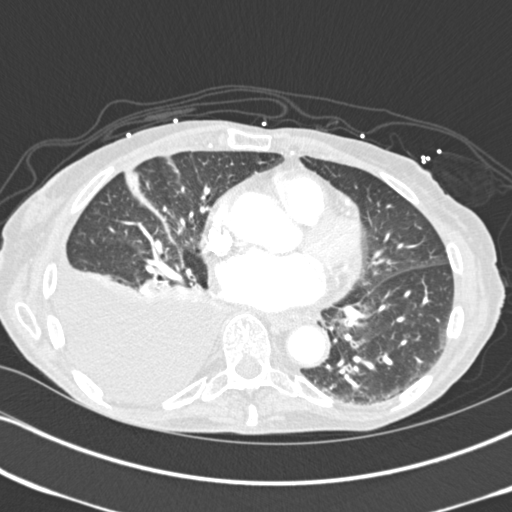
[im 106/246  lung]
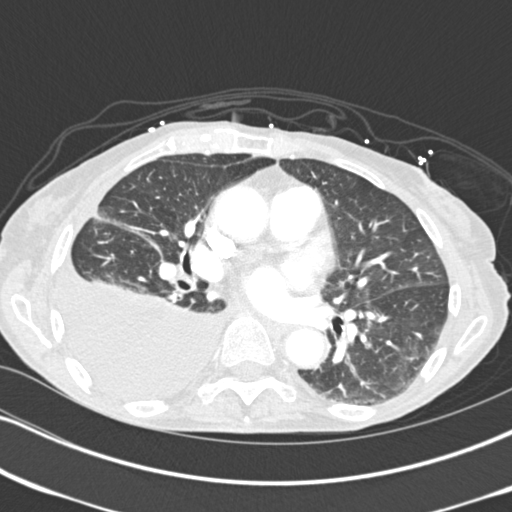
[im 141/246  lung]
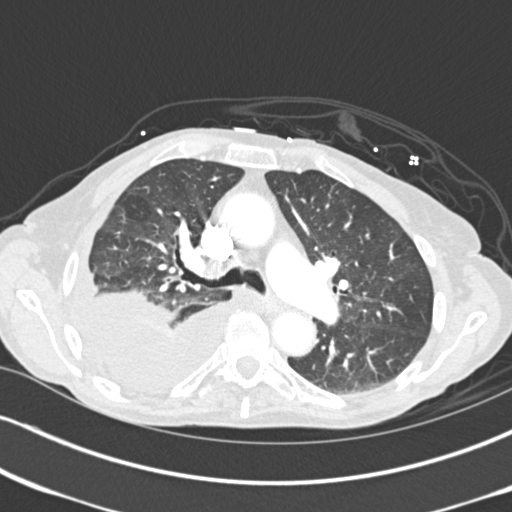
[im 158/246  lung]
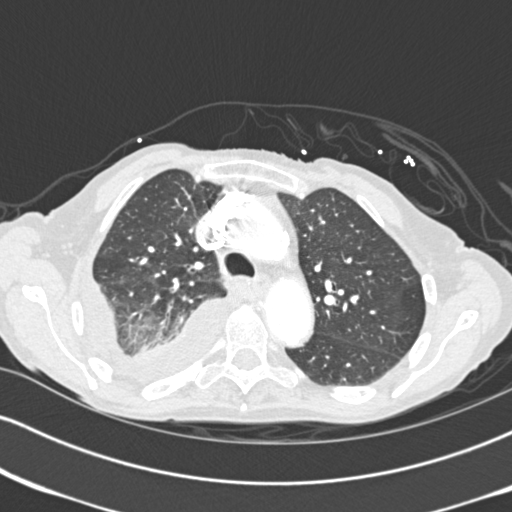
[im 193/246  lung]
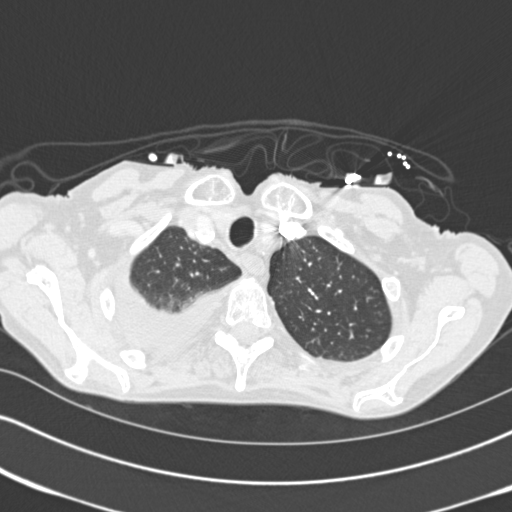
[im 228/246  lung]
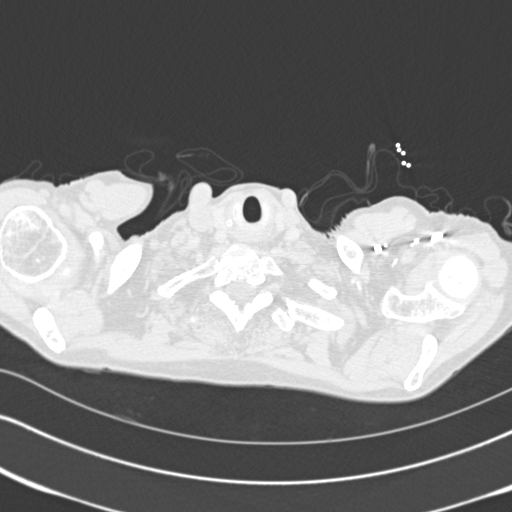

[Series 8: rtn a/p with · axial · 0.64mm/px · z∈[+968,+1088]mm · 2 of 73 slices shown]
[im 25/73  lung]
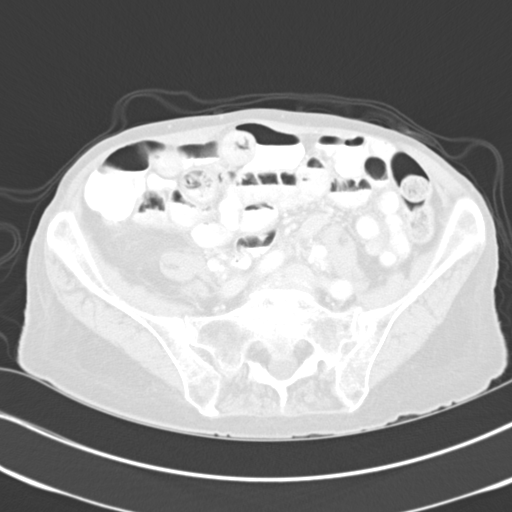
[im 49/73  mediastinal]
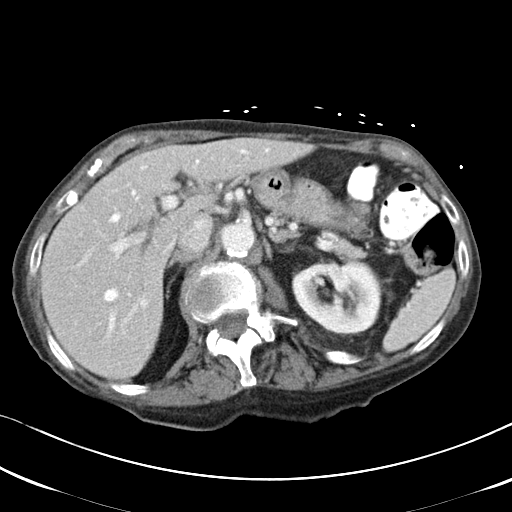

[13 of 38 positions shown; findings below may reference images not displayed]

FINDINGS: CTA CHEST FINDINGS

There are no filling defects within the pulmonary arteries to
suggest pulmonary embolus.

Ectasia of the transverse and proximal descending thoracic aorta
without dissection. Mild atherosclerotic calcifications. Moderate
multi chamber cardiomegaly.

There is a moderate-to-large simple right pleural effusion with
small amount of fluid tracking in the minor fissure. Adjacent
compressive atelectasis involving the right lower and to a lesser
extent middle lobes. There is no left pleural effusion. No
pericardial effusion. Enlarged right thyroid gland as heterogeneous
consistent with goiter.

Central bronchial thickening. Mild ground-glass opacity in the left
lower lobe. Scattered calcifications within the atelectatic right
lower lobe, may reflect granulomas.

There are no acute or suspicious osseous abnormalities.

CT ABDOMEN and PELVIS FINDINGS

The gallbladder is physiologically distended. There is dilatation of
the common bile duct measuring 13 mm proximally and 11 mm distally.
No calcified gallstones or choledocholithiasis. There is central
intrahepatic biliary ductal dilatation. No focal hepatic lesion. The
pancreas is atrophic with mild prominence of the pancreatic duct
measuring 3 mm. No peripancreatic inflammatory change. No definite
focal pancreatic abnormality. The spleen is normal. There is no
adrenal nodule.

Kidneys demonstrate symmetric enhancement and excretion common
bilateral extrarenal pelvis configuration of both kidneys. Mild
perinephric stranding noted about the right greater than left
kidney. No intrarenal fluid collection.

The stomach is decompressed and not well evaluated. There are no
dilated or thickened small bowel loops. No bowel obstruction, cecum
is located in the central pelvis. There is oral contrast throughout
the transverse colon. Moderate volume of stool in the distal colon
without colonic wall thickening.

Abdominal aorta is normal in caliber, tortuous in its course with
moderate atherosclerosis. No retroperitoneal adenopathy.

Within the pelvis the bladder is physiologically distended, there is
a left lateral bladder diverticulum. No bladder wall thickening or
perivesicular inflammatory change. The uterus is atrophic, normal
for age. There is no adnexal mass. There is dilatation of the left
ovarian vein. No pelvic adenopathy or free fluid.

Severe scoliotic curvature of the lumbar spine with associated
degenerative change. Posttraumatic deformity at the pubic symphysis.
There are no acute or suspicious osseous abnormalities.

Review of the MIP images confirms the above findings.
IMPRESSION: 1. No pulmonary embolus.
2. Moderate to large simple right pleural effusion with adjacent
compressive atelectasis. Bronchial thickening, ground-glass
opacities in the left lower lobe are most consistent with
pneumonitis, likely infectious or inflammatory.
3. Intra and extrahepatic biliary duct dilatation, with physiologic
distension of gallbladder. The significance is uncertain, and
correlation with LFTs is recommended. The pancreas is atrophic with
mild prominence of the pancreatic duct, however discrete pancreatic
mass is not seen.
4. Mild perinephric stranding about both kidneys, may be related to
chronic renal disease versus urinary tract infection.
5. Chronic findings include ectatic atherosclerotic thoracic and
abdominal aorta, right thyroid goiter, and moderate-sized left
bladder diverticulum.

## 2016-11-20 DIAGNOSIS — I10 Essential (primary) hypertension: Secondary | ICD-10-CM | POA: Diagnosis not present

## 2016-11-20 DIAGNOSIS — E785 Hyperlipidemia, unspecified: Secondary | ICD-10-CM | POA: Diagnosis not present

## 2016-11-21 LAB — COMPREHENSIVE METABOLIC PANEL
AG Ratio: 1.5 (calc) (ref 1.0–2.5)
ALBUMIN MSPROF: 4 g/dL (ref 3.6–5.1)
ALKALINE PHOSPHATASE (APISO): 63 U/L (ref 33–130)
ALT: 10 U/L (ref 6–29)
AST: 18 U/L (ref 10–35)
BUN/Creatinine Ratio: 18 (calc) (ref 6–22)
BUN: 19 mg/dL (ref 7–25)
CO2: 32 mmol/L (ref 20–32)
CREATININE: 1.03 mg/dL — AB (ref 0.60–0.88)
Calcium: 9.5 mg/dL (ref 8.6–10.4)
Chloride: 100 mmol/L (ref 98–110)
Globulin: 2.7 g/dL (calc) (ref 1.9–3.7)
Glucose, Bld: 83 mg/dL (ref 65–99)
POTASSIUM: 4.5 mmol/L (ref 3.5–5.3)
SODIUM: 138 mmol/L (ref 135–146)
Total Bilirubin: 0.5 mg/dL (ref 0.2–1.2)
Total Protein: 6.7 g/dL (ref 6.1–8.1)

## 2016-11-21 LAB — LIPID PANEL
CHOL/HDL RATIO: 2 (calc) (ref <5.0)
CHOLESTEROL: 108 mg/dL (ref <200)
HDL: 54 mg/dL (ref >50)
LDL CHOLESTEROL (CALC): 38 mg/dL
Non-HDL Cholesterol (Calc): 54 mg/dL (calc) (ref <130)
TRIGLYCERIDES: 81 mg/dL (ref <150)

## 2016-11-26 ENCOUNTER — Encounter: Payer: Self-pay | Admitting: Internal Medicine

## 2016-11-26 ENCOUNTER — Non-Acute Institutional Stay: Payer: Medicare Other | Admitting: Internal Medicine

## 2016-11-26 VITALS — BP 130/70 | HR 67 | Temp 98.8°F | Resp 16 | Ht 62.5 in | Wt 117.4 lb

## 2016-11-26 DIAGNOSIS — G8929 Other chronic pain: Secondary | ICD-10-CM

## 2016-11-26 DIAGNOSIS — Z8669 Personal history of other diseases of the nervous system and sense organs: Secondary | ICD-10-CM | POA: Diagnosis not present

## 2016-11-26 DIAGNOSIS — M549 Dorsalgia, unspecified: Secondary | ICD-10-CM | POA: Diagnosis not present

## 2016-11-26 DIAGNOSIS — E041 Nontoxic single thyroid nodule: Secondary | ICD-10-CM | POA: Diagnosis not present

## 2016-11-26 DIAGNOSIS — Z8679 Personal history of other diseases of the circulatory system: Secondary | ICD-10-CM

## 2016-11-26 DIAGNOSIS — E785 Hyperlipidemia, unspecified: Secondary | ICD-10-CM

## 2016-11-26 DIAGNOSIS — K5909 Other constipation: Secondary | ICD-10-CM

## 2016-11-26 DIAGNOSIS — D509 Iron deficiency anemia, unspecified: Secondary | ICD-10-CM | POA: Diagnosis not present

## 2016-11-26 DIAGNOSIS — I10 Essential (primary) hypertension: Secondary | ICD-10-CM | POA: Diagnosis not present

## 2016-11-26 DIAGNOSIS — Z9181 History of falling: Secondary | ICD-10-CM

## 2016-11-26 DIAGNOSIS — Z8673 Personal history of transient ischemic attack (TIA), and cerebral infarction without residual deficits: Secondary | ICD-10-CM | POA: Diagnosis not present

## 2016-11-26 DIAGNOSIS — Z8709 Personal history of other diseases of the respiratory system: Secondary | ICD-10-CM | POA: Diagnosis not present

## 2016-11-26 MED ORDER — ROSUVASTATIN CALCIUM 5 MG PO TABS: 5.0000 mg | ORAL_TABLET | Freq: Every day | ORAL | 3 refills | Status: DC

## 2016-11-26 NOTE — Progress Notes (Signed)
[facility]  Provider: Oneal Grout MD   Location:  [facility]   Place of Service:  Clinic (12)  PCP: Oneal Grout, MD Patient Care Team: Oneal Grout, MD as PCP - General (Internal Medicine) Jasmine Hopping, MD (Inactive) as Consulting Physician (Neurosurgery) Keturah Barre, MD as Consulting Physician (Otolaryngology) Meryl Dare, MD as Consulting Physician (Gastroenterology) Elliot Cousin, OD as Consulting Physician (Optometry) Sheran Luz, MD as Consulting Physician (Physical Medicine and Rehabilitation) Ranee Gosselin, MD as Consulting Physician (Orthopedic Surgery) [place] Chilton Si, MD as Attending Physician (Cardiology) Sherrie George, MD as Consulting Physician (Ophthalmology) Mast, Man X, NP as Nurse Practitioner (Internal Medicine)  Extended Emergency Contact Information Primary Emergency Contact: Gwenlyn Fudge of [place] Home Phone: [phone] Mobile Phone: [phone] Relation: Son Secondary Emergency Contact: Towanda Malkin [place] of [place] Mobile Phone: [phone] Relation: Other   Goals of Care: Advanced Directive information Advanced Directives 05/09/2016  Does Patient Have a Medical Advance Directive? Yes  Type of Estate agent of [place];Living will  Does patient want to make changes to medical advance directive? -  Copy of Healthcare Power of Attorney in Chart? Yes      Chief Complaint  Patient presents with  . Medical Management of Chronic Issues    6 month follow up  . Medication Refill    No refills needed at this time.  Marland Kitchen Results    Discuss the labs.    HPI: Patient is a 83 y.o. female seen today for routine visit.   Hypertension- controlled, taking metoprolol succinate and aspirin. Denies headache, chest pain, dyspnea. She walks for exercise.   Hyperlipidemia- currently on rosuvastatin. Denies  myalgia, tolerating well.   Recurrent pleural effusions- has required thoracocentesis in the past. None recently. Breathing stable.   History of arrhythmia- currently on metoprolol, denies palpitations,denies orthopnea and PND  Constipation- takes fruits and vegetables. Maintains hydration and takes colace.  Chronic back pain- currently on norco 5-325 mg tid and is followed by Dr Ethelene Hal.  History of fall- denies any recent fall. Using her rolling walker with brake and seat.   Nontoxic nodular goiter- stable, denies any trouble swallowing.   History of CVA- gets around with her walker. No recent fall. Tolerating meds well.   Past Medical History:  Diagnosis Date  . Acute upper respiratory infections of unspecified site 2013  . Cellulitis and abscess of hand, except fingers and thumb 10/04/2010  . Degeneration of intervertebral disc, site unspecified 2000  . Diaphragmatic hernia without mention of obstruction or gangrene 08/15/1998  . Diverticulosis of colon (without mention of hemorrhage) 2000  . Hearing loss 09/28/2015  . Hyperlipidemia 2007  . Hypertension 2007  . Impacted cerumen   . Loss of weight 04/25/2011  . Myalgia and myositis, unspecified 01/07/2007  . Nontoxic uninodular goiter 08/15/1998  . Osteoarthrosis, unspecified whether generalized or localized, unspecified site 07/22/2000  . Osteoporosis 2000  . Other drug allergy(995.27) 04/04/2011  . Pain in limb 2012  . Personal history of fall 2012  . Pulmonary hypertension (HCC) 07/25/14   Mild-moderate  . Spinal stenosis, unspecified region other than cervical 2002  . Sprain of ankle, left   . Supraventricular premature beats 10/2006  . Tricuspid regurgitation 07/25/14   Mild-moderate  . 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 Urinary tract infection, site not specified   . Urine frequency 01/20/2014  .  Urticaria, unspecified 11/01/2008  . Vitamin D deficiency 04/25/2011   Past Surgical History:    Procedure Laterality Date  . BACK SURGERY  1990   HNP L3-4 Dr. Lynnette Caffey  . CATARACT EXTRACTION W/ INTRAOCULAR LENS IMPLANT Right 05/19/2014   Dr. Dione Booze  . COLONOSCOPY  1992   acute segmental colitis Dr. Russella Dar  . FACIAL COSMETIC SURGERY  1995   Dr. Charolotte Eke  . LARYNGOSCOPY  1987   and biopsy  Dr. Haroldine Laws    reports that she has never smoked. She has never used smokeless tobacco. She reports that she does not drink alcohol or use drugs. Social History   Social History  . Marital status: Widowed    Spouse name: N/A  . Number of children: N/A  . Years of education: N/A   Occupational History  . retired Film/video editor    Social History Main Topics  . Smoking status: Never Smoker  . Smokeless tobacco: Never Used  . Alcohol use No  . Drug use: No  . Sexual activity: No   Other Topics Concern  . Not on file   Social History Narrative   Lives at [facility] since 7/[address]   Widowed   Never smoke   No alcohol   Exercise none   Still drives, around town    [employer] with walker   Living Will , [place]              Functional Status Survey:    Family History  Problem Relation Age of Onset  . Stroke Mother   . Stroke Father   . Hypertension Brother   . Rheum arthritis Maternal Grandmother   . Pancreatic cancer Sister     Health Maintenance  Topic Date Due  . DEXA SCAN  09/02/1991  . PNA vac Low Risk Adult (1 of 2 - PCV13) 03/11/1992  . INFLUENZA VACCINE  10/09/2016  . TETANUS/TDAP  04/24/2021    Allergies  Allergen Reactions  . Doxycycline Itching  . Lipitor [Atorvastatin] Other (See Comments)    PAIN IN LEGS  . Pantoprazole Itching  . Pepcid [Famotidine] Itching    Outpatient Encounter Prescriptions as of 11/26/2016  Medication Sig  . aspirin EC 81 MG tablet Take 81 mg by mouth at bedtime.  . cholecalciferol (VITAMIN D) 1000 UNITS tablet Take 1,000 Units by mouth daily.   . CRESTOR 10 MG tablet TAKE 1 TABLET DAILY TO LOWER CHOLESTEROL.  Marland Kitchen  docusate sodium (COLACE) 100 MG capsule Take 100 mg by mouth 3 (three) times daily. 7:30am, 2pm, and bedtime -to prevent constipation  . HYDROcodone-acetaminophen (NORCO/VICODIN) 5-325 MG per tablet Take 1 tablet by mouth 3 (three) times daily. 7:30am, 2pm, and bedtime  . Magnesium 250 MG TABS Take 250 mg by mouth daily at 2 PM.   . metoprolol succinate (TOPROL-XL) 25 MG 24 hr tablet TAKE 1 TABLET DAILY.  . Multiple Vitamin (MULTIVITAMIN WITH MINERALS) TABS tablet Take 1 tablet by mouth daily at 2 PM.  . Multiple Vitamins-Minerals (PRESERVISION AREDS 2) CAPS Take 1 capsule by mouth 2 (two) times daily.   No facility-administered encounter medications on file as of 11/26/2016.     Review of Systems  Constitutional: Negative for appetite change, chills, fatigue and fever.  HENT: Positive for voice change. Negative for congestion, hearing loss, mouth sores, nosebleeds, sinus pain, sinus pressure, sore throat and trouble swallowing.        Chronic dysphonia  Eyes: Negative for pain and redness.  Has eye glasses, next eye doc appointment on 12/11/16. She is s/p cataract surgery.  Respiratory: Negative for cough, shortness of breath and wheezing.   Cardiovascular: Negative for chest pain, palpitations and leg swelling.  Gastrointestinal: Positive for constipation. Negative for abdominal pain, blood in stool, diarrhea, nausea and vomiting.       Colace helps  Genitourinary: Negative for dysuria, flank pain, hematuria and pelvic pain.       Wakes up 1-2 times a night to urinate  Musculoskeletal: Positive for arthralgias, back pain and gait problem.       Followed by Dr Ethelene Hal  Skin: Negative for rash and wound.  Neurological: Negative for dizziness, tremors, seizures, syncope, numbness and headaches.  Hematological: Bruises/bleeds easily.  Psychiatric/Behavioral: Negative for behavioral problems, dysphoric mood, hallucinations, sleep disturbance and suicidal ideas.    Vitals:   11/26/16 0904   BP: 130/70  Pulse: 67  Resp: 16  Temp: 98.8 F (37.1 C)  TempSrc: Oral  SpO2: 96%  Weight: 117 lb 6.4 oz (53.3 kg)  Height: 5' 2.5" (1.588 m)   Body mass index is 21.13 kg/m.   Wt Readings from Last 3 Encounters:  11/26/16 117 lb 6.4 oz (53.3 kg)  08/26/16 115 lb 9.6 oz (52.4 kg)  05/09/16 114 lb (51.7 kg)   Physical Exam  Constitutional: She is oriented to person, place, and time. No distress.  Thin built elderly female in no distress  HENT:  Head: Normocephalic and atraumatic.  Nose: Nose normal.  Mouth/Throat: Oropharynx is clear and moist. No oropharyngeal exudate.  Eyes: Pupils are equal, round, and reactive to light. Conjunctivae and EOM are normal. Right eye exhibits no discharge. Left eye exhibits no discharge. No scleral icterus.  Neck: Normal range of motion. Neck supple.  Cardiovascular: Normal rate and regular rhythm.   No murmur heard. Pulmonary/Chest: Effort normal and breath sounds normal. No respiratory distress. She has no wheezes. She has no rales.  Abdominal: Soft. Bowel sounds are normal. She exhibits no distension. There is no tenderness. There is no rebound.  Musculoskeletal: Normal range of motion. She exhibits no edema.  Lymphadenopathy:    She has no cervical adenopathy.  Neurological: She is alert and oriented to person, place, and time.  Skin: Skin is warm and dry. No rash noted. She is not diaphoretic. No erythema.  Psychiatric: She has a normal mood and affect. Her behavior is normal. Thought content normal.    Labs reviewed: Basic Metabolic Panel:  Recent Labs  96/04/54 0001 11/20/16 0000  NA 140 138  K 4.6 4.5  CL 102 100  CO2 29 32  GLUCOSE 86 83  BUN 15 19  CREATININE 1.18* 1.03*  CALCIUM 9.4 9.5   Liver Function Tests:  Recent Labs  03/18/16 0001 11/20/16 0000  AST 18 18  ALT 10 10  ALKPHOS 53  --   BILITOT 0.5 0.5  PROT 6.7 6.7  ALBUMIN 3.8  --    No results for input(s): LIPASE, AMYLASE in the last 8760  hours. No results for input(s): AMMONIA in the last 8760 hours. CBC: No results for input(s): WBC, NEUTROABS, HGB, HCT, MCV, PLT in the last 8760 hours. Cardiac Enzymes: No results for input(s): CKTOTAL, CKMB, CKMBINDEX, TROPONINI in the last 8760 hours. BNP: Invalid input(s): POCBNP Lab Results  Component Value Date   HGBA1C 5.7 (H) 07/25/2014   Lab Results  Component Value Date   TSH 1.21 03/18/2016   Lab Results  Component Value Date   VITAMINB12  1122 (H) 09/19/2010   Lab Results  Component Value Date   FOLATE >20.0 09/19/2010   Lab Results  Component Value Date   IRON 18 (L) 09/19/2010   TIBC 207 (L) 09/19/2010   FERRITIN 87 09/19/2010    Lipid Panel:  Recent Labs  03/18/16 0001 11/20/16 0000  CHOL 90 108  HDL 42* 54  LDLCALC 29  --   TRIG 97 81  CHOLHDL 2.1 2.0   Lab Results  Component Value Date   HGBA1C 5.7 (H) 07/25/2014    Procedures since last visit: No results found.  Assessment/Plan  1. Hyperlipidemia LDL goal <70 Improved with LDL at goal. Decrease rosuvastatin to 5 mg daily - rosuvastatin (CRESTOR) 5 MG tablet; Take 1 tablet (5 mg total) by mouth daily.  Dispense: 90 tablet; Refill: 3  2. Iron deficiency anemia, unspecified iron deficiency anemia type No cbc to review.  - CBC with Differential/Platelets; Future - Ferritin; Future  3. Essential hypertension controlled, continue metoprolol succinate 25 mg daily and aspirin 81 mg daily. Continue walking for exercise.   4. Nontoxic uninodular goiter Stable.   5. Personal history of fall No further fall. Continue to use walker and careful and slow position change.   6. Chronic back pain, unspecified back location, unspecified back pain laterality Continue norco 5-325 mg TID, to follow with Dr Ethelene Hal  7. History of cardiac arrhythmia Currently symptom free. Continue metoprolol succinate.   8. History of pleural effusion Stable breathing, CTAB. monitor  9. History of cataract S/p  surgical repair. monitor  10. Chronic constipation Maintain hydration, continue colace.   11. History of cardioembolic cerebrovascular accident (CVA) Continue aspirin, rosuvastatin and antihypertensive.     Labs/tests ordered:  As above  Next appointment: 6 months for physical , MMSE  Communication: reviewed care plan with patient and agrees with care plan    Oneal Grout, MD Internal Medicine [facility] [address] [place], [place] [address] Cell Phone (Monday-Friday 8 am - 5 pm): [phone] On Call: [phone] and follow prompts after 5 pm and on weekends Office Phone: [phone] Office Fax: [phone]

## 2016-11-28 ENCOUNTER — Encounter: Payer: Self-pay | Admitting: Nurse Practitioner

## 2016-12-09 DIAGNOSIS — D509 Iron deficiency anemia, unspecified: Secondary | ICD-10-CM | POA: Diagnosis not present

## 2016-12-10 LAB — CBC WITH DIFFERENTIAL/PLATELET
BASOS PCT: 0.7 %
Basophils Absolute: 39 cells/uL (ref 0–200)
EOS ABS: 151 {cells}/uL (ref 15–500)
Eosinophils Relative: 2.7 %
HEMATOCRIT: 36.4 % (ref 35.0–45.0)
Hemoglobin: 11.9 g/dL (ref 11.7–15.5)
LYMPHS ABS: 2414 {cells}/uL (ref 850–3900)
MCH: 28.4 pg (ref 27.0–33.0)
MCHC: 32.7 g/dL (ref 32.0–36.0)
MCV: 86.9 fL (ref 80.0–100.0)
MPV: 9.9 fL (ref 7.5–12.5)
Monocytes Relative: 8.8 %
NEUTROS PCT: 44.7 %
Neutro Abs: 2503 cells/uL (ref 1500–7800)
Platelets: 272 10*3/uL (ref 140–400)
RBC: 4.19 10*6/uL (ref 3.80–5.10)
RDW: 13.4 % (ref 11.0–15.0)
Total Lymphocyte: 43.1 %
WBC: 5.6 10*3/uL (ref 3.8–10.8)
WBCMIX: 493 {cells}/uL (ref 200–950)

## 2016-12-10 LAB — FERRITIN: Ferritin: 62 ng/mL (ref 20–288)

## 2016-12-11 DIAGNOSIS — Z961 Presence of intraocular lens: Secondary | ICD-10-CM | POA: Diagnosis not present

## 2016-12-11 DIAGNOSIS — H5211 Myopia, right eye: Secondary | ICD-10-CM | POA: Diagnosis not present

## 2016-12-19 ENCOUNTER — Encounter: Payer: Self-pay | Admitting: Nurse Practitioner

## 2017-01-02 ENCOUNTER — Encounter: Payer: Self-pay | Admitting: Nurse Practitioner

## 2017-02-17 ENCOUNTER — Telehealth: Payer: Self-pay | Admitting: Internal Medicine

## 2017-02-17 NOTE — Telephone Encounter (Signed)
I called the patient to schedule AWV-I, but she declined.  She said that she's alright right now. VDM (DD)

## 2017-02-25 ENCOUNTER — Other Ambulatory Visit: Payer: Self-pay | Admitting: Cardiovascular Disease

## 2017-02-25 DIAGNOSIS — I10 Essential (primary) hypertension: Secondary | ICD-10-CM

## 2017-02-25 NOTE — Telephone Encounter (Signed)
Please review for refill. Thanks!  

## 2017-03-13 ENCOUNTER — Non-Acute Institutional Stay: Payer: Medicare Other | Admitting: Nurse Practitioner

## 2017-03-13 ENCOUNTER — Encounter: Payer: Self-pay | Admitting: Nurse Practitioner

## 2017-03-13 DIAGNOSIS — I10 Essential (primary) hypertension: Secondary | ICD-10-CM | POA: Diagnosis not present

## 2017-03-13 DIAGNOSIS — J209 Acute bronchitis, unspecified: Secondary | ICD-10-CM

## 2017-03-13 MED ORDER — GUAIFENESIN ER 600 MG PO TB12
600.0000 mg | ORAL_TABLET | Freq: Two times a day (BID) | ORAL | 0 refills | Status: AC
Start: 2017-03-13 — End: 2017-03-18

## 2017-03-13 MED ORDER — AMOXICILLIN-POT CLAVULANATE 875-125 MG PO TABS: 1.0000 | ORAL_TABLET | Freq: Two times a day (BID) | ORAL | 0 refills | Status: AC

## 2017-03-13 NOTE — Assessment & Plan Note (Signed)
Controlled, continue Metoprolol.  

## 2017-03-13 NOTE — Progress Notes (Signed)
Location:   [facility]   Place of Service:  Clinic (12) Provider: Chipper Oman NP  Code Status: DNR Goals of Care:  Advanced Directives 05/09/2016  Does Patient Have a Medical Advance Directive? Yes  Type of Estate agent of [place];Living will  Does patient want to make changes to medical advance directive? -  Copy of Healthcare Power of Attorney in Chart? Yes     Chief Complaint  Patient presents with  . Acute Visit    cough    HPI: Patient is a 84 y.o. female seen today for an acute visit for coughing, low grade fever, phlegm production, sore in her chest and abdominal wall from coughing, keep her from night sleep x 4-5 days. She denied palpitation, no O2 desaturation, her appetite remains poor, but she is able to maintain oral fluid intake.   Hx of HTN, blood pressure is controlled on Metoprolol  Past Medical History:  Diagnosis Date  . Acute upper respiratory infections of unspecified site 2013  . Cellulitis and abscess of hand, except fingers and thumb 10/04/2010  . Degeneration of intervertebral disc, site unspecified 2000  . Diaphragmatic hernia without mention of obstruction or gangrene 08/15/1998  . Diverticulosis of colon (without mention of hemorrhage) 2000  . Hearing loss 09/28/2015  . Hyperlipidemia 2007  . Hypertension 2007  . Impacted cerumen   . Loss of weight 04/25/2011  . Myalgia and myositis, unspecified 01/07/2007  . Nontoxic uninodular goiter 08/15/1998  . Osteoarthrosis, unspecified whether generalized or localized, unspecified site 07/22/2000  . Osteoporosis 2000  . Other drug allergy(995.27) 04/04/2011  . Pain in limb 2012  . Personal history of fall 2012  . Pulmonary hypertension (HCC) 07/25/14   Mild-moderate  . Spinal stenosis, unspecified region other than cervical 2002  . Sprain of ankle, left   . Supraventricular premature beats 10/2006  . Tricuspid regurgitation 07/25/14   Mild-moderate  . Unspecified late effects of cerebrovascular  disease 01/01/2006  . Urinary tract infection, site not specified   . Urine frequency 01/20/2014  . Urticaria, unspecified 11/01/2008  . Vitamin D deficiency 04/25/2011    Past Surgical History:  Procedure Laterality Date  . BACK SURGERY  1990   HNP L3-4 Dr. Lynnette Caffey  . CATARACT EXTRACTION W/ INTRAOCULAR LENS IMPLANT Right 05/19/2014   Dr. Dione Booze  . COLONOSCOPY  1992   acute segmental colitis Dr. Russella Dar  . FACIAL COSMETIC SURGERY  1995   Dr. Charolotte Eke  . LARYNGOSCOPY  1987   and biopsy  Dr. Haroldine Laws    Allergies  Allergen Reactions  . Doxycycline Itching  . Lipitor [Atorvastatin] Other (See Comments)    PAIN IN LEGS  . Pantoprazole Itching  . Pepcid [Famotidine] Itching    Allergies as of 03/13/2017      Reactions   Doxycycline Itching   Lipitor [atorvastatin] Other (See Comments)   PAIN IN LEGS   Pantoprazole Itching   Pepcid [famotidine] Itching      Medication List        Accurate as of 03/13/17 11:59 PM. Always use your most recent med list.          amoxicillin-clavulanate 875-125 MG tablet Commonly known as:  AUGMENTIN Take 1 tablet by mouth 2 (two) times daily for 5 days.   aspirin EC 81 MG tablet Take 81 mg by mouth at bedtime.   cholecalciferol 1000 units tablet Commonly known as:  VITAMIN D Take 1,000 Units by mouth daily.   docusate sodium 100 MG capsule  Commonly known as:  COLACE Take 100 mg by mouth 3 (three) times daily. 7:30am, 2pm, and bedtime -to prevent constipation   guaiFENesin 600 MG 12 hr tablet Commonly known as:  MUCINEX Take 1 tablet (600 mg total) by mouth 2 (two) times daily for 5 days.   HYDROcodone-acetaminophen 5-325 MG tablet Commonly known as:  NORCO/VICODIN Take 1 tablet by mouth 3 (three) times daily. 7:30am, 2pm, and bedtime. Provided by DR RAMOS office.   Magnesium 250 MG Tabs Take 250 mg by mouth daily at 2 PM.   metoprolol succinate 25 MG 24 hr tablet Commonly known as:  TOPROL-XL TAKE 1 TABLET ONCE DAILY.     multivitamin with minerals Tabs tablet Take 1 tablet by mouth daily at 2 PM.   PRESERVISION AREDS 2 Caps Take 1 capsule by mouth 2 (two) times daily.   rosuvastatin 5 MG tablet Commonly known as:  CRESTOR Take 1 tablet (5 mg total) by mouth daily.       Review of Systems:  Review of Systems  Constitutional: Positive for activity change, appetite change, chills, fatigue and fever. Negative for diaphoresis.  HENT: Positive for congestion, hearing loss, rhinorrhea and sinus pressure. Negative for sinus pain, sneezing, sore throat, tinnitus, trouble swallowing and voice change.   Respiratory: Positive for cough. Negative for choking, chest tightness, shortness of breath and wheezing.   Cardiovascular: Negative for chest pain, palpitations and leg swelling.  Gastrointestinal: Negative for abdominal distention, abdominal pain, constipation, diarrhea, nausea and vomiting.  Musculoskeletal: Positive for gait problem.  Skin: Negative for color change, pallor and rash.  Neurological: Negative for tremors, speech difficulty, weakness and headaches.    Health Maintenance  Topic Date Due  . DEXA SCAN  09/02/1991  . PNA vac Low Risk Adult (1 of 2 - PCV13) 03/11/1992  . TETANUS/TDAP  04/24/2021  . INFLUENZA VACCINE  Completed    Physical Exam: Vitals:   03/13/17 1356  BP: 140/82  Pulse: 90  Resp: 20  Temp: 98.7 F (37.1 C)  SpO2: 94%  Weight: 118 lb 12.8 oz (53.9 kg)  Height: 5\' 3"  (1.6 m)   Body mass index is 21.04 kg/m. Physical Exam  Constitutional: She is oriented to person, place, and time. She appears well-developed and well-nourished. No distress.  HENT:  Head: Normocephalic and atraumatic.  Nose: Nose normal.  Mouth/Throat: Oropharynx is clear and moist. No oropharyngeal exudate.  Eyes: Conjunctivae and EOM are normal. Pupils are equal, round, and reactive to light. Right eye exhibits no discharge. Left eye exhibits discharge.  Neck: Normal range of motion. Neck  supple. No JVD present. No thyromegaly present.  Cardiovascular: Normal rate, regular rhythm and normal heart sounds.  No murmur heard. Pulmonary/Chest: Effort normal and breath sounds normal. No stridor. She has no wheezes. She has no rales.  Central congestion.   Musculoskeletal: Normal range of motion. She exhibits no edema.  Ambulates with walker.   Lymphadenopathy:    She has no cervical adenopathy.  Neurological: She is alert and oriented to person, place, and time. She exhibits normal muscle tone. Coordination normal.  Skin: Skin is warm and dry. No rash noted. She is not diaphoretic. No erythema.  Psychiatric: She has a normal mood and affect.    Labs reviewed: Basic Metabolic Panel: Recent Labs    03/18/16 0001 11/20/16 0000  NA 140 138  K 4.6 4.5  CL 102 100  CO2 29 32  GLUCOSE 86 83  BUN 15 19  CREATININE 1.18* 1.03*  CALCIUM 9.4 9.5  TSH 1.21  --    Liver Function Tests: Recent Labs    03/18/16 0001 11/20/16 0000  AST 18 18  ALT 10 10  ALKPHOS 53  --   BILITOT 0.5 0.5  PROT 6.7 6.7  ALBUMIN 3.8  --    No results for input(s): LIPASE, AMYLASE in the last 8760 hours. No results for input(s): AMMONIA in the last 8760 hours. CBC: Recent Labs    12/09/16 0000  WBC 5.6  NEUTROABS 2,503  HGB 11.9  HCT 36.4  MCV 86.9  PLT 272   Lipid Panel: Recent Labs    03/18/16 0001 11/20/16 0000  CHOL 90 108  HDL 42* 54  LDLCALC 29  --   TRIG 97 81  CHOLHDL 2.1 2.0   Lab Results  Component Value Date   HGBA1C 5.7 (H) 07/25/2014    Procedures since last visit: No results found.  Assessment/Plan Acute bronchitis coughing, low grade fever, phlegm production, sore in her chest and abdominal wall from coughing, keep her from night sleep x 4-5 days. She denied palpitation, no O2 desaturation, her appetite remains poor, but she is able to maintain oral fluid intake.  Augmentin 875mg  q12hr x 5 days, Mucinex 600mg  bid x 5 days. May CXR if no  better   Hypertension Controlled, continue Metoprolol     Labs/tests ordered: may CXR if no better, prevnar 13 prescription  Next appt:  05/13/2017  Time spend 25 minutes

## 2017-03-13 NOTE — Assessment & Plan Note (Signed)
coughing, low grade fever, phlegm production, sore in her chest and abdominal wall from coughing, keep her from night sleep x 4-5 days. She denied palpitation, no O2 desaturation, her appetite remains poor, but she is able to maintain oral fluid intake.  Augmentin 875mg  q12hr x 5 days, Mucinex 600mg  bid x 5 days. May CXR if no better

## 2017-03-13 NOTE — Patient Instructions (Signed)
Prescription Prevnar 13 given to the patient. Augmentin 875/125mg  bid x 5 days, Mucinex 600mg  bid x 5 days prescription sent to pharmacy.

## 2017-03-20 ENCOUNTER — Non-Acute Institutional Stay: Payer: Medicare Other | Admitting: Nurse Practitioner

## 2017-03-20 ENCOUNTER — Encounter: Payer: Self-pay | Admitting: Nurse Practitioner

## 2017-03-20 DIAGNOSIS — R05 Cough: Secondary | ICD-10-CM | POA: Diagnosis not present

## 2017-03-20 DIAGNOSIS — J209 Acute bronchitis, unspecified: Secondary | ICD-10-CM

## 2017-03-20 DIAGNOSIS — R053 Chronic cough: Secondary | ICD-10-CM | POA: Insufficient documentation

## 2017-03-20 MED ORDER — METHYLPREDNISOLONE 4 MG PO TBPK: ORAL_TABLET | ORAL | Status: DC

## 2017-03-20 MED ORDER — GUAIFENESIN ER 600 MG PO TB12: 600.0000 mg | ORAL_TABLET | Freq: Two times a day (BID) | ORAL | 0 refills | Status: AC

## 2017-03-20 NOTE — Assessment & Plan Note (Signed)
treated for acute bronchitis since 03/13/17 with 5 day course of Augmentin 875mg  q12hs, Mucinex 600mg  bid x 5 days. She sleeps better at night, resolved chest and abd sore/aches, but her cough persisted during day while she is up and moving about, small amount clear phlegm produced. She denied chest pain, SOB, palpitation, nausea, vomiting, diarrhea. She is afebrile, no O2 desaturation. Post infection inflammation and cough, will continue Mucinex, adding Medrol dose pack

## 2017-03-20 NOTE — Patient Instructions (Addendum)
After URI infection cough, continue Mucinex, called in for Medrol dose pack. Continue appointment 05/13/17

## 2017-03-20 NOTE — Assessment & Plan Note (Signed)
Post acute bronchitis cough, will Mucinex 600mg  po bid x 5 days, Medrol dose pack. Observe.

## 2017-03-20 NOTE — Progress Notes (Signed)
Location:   [facility]   Place of Service:  Clinic (12) Provider: Chipper Oman NP  Code Status: DNR Goals of Care: [place] 03/20/2017  Does Patient Have a Medical Advance Directive? Yes  Type of Estate agent of [place];Living will  Does patient want to make changes to medical advance directive? No - Patient declined  Copy of Healthcare Power of Attorney in Chart? Yes     Chief Complaint  Patient presents with  . Acute Visit    still coughing, with clear mucus//    HPI: Patient is a 84 y.o. [place] seen today for an acute visit for she was treated for acute bronchitis since 03/13/17 with 5 day course of Augmentin 875mg  q12hs, Mucinex 600mg  bid x 5 days. She sleeps better at night, resolved chest and abd sore/aches, but her cough persisted during day while she is up and moving about, small amount clear phlegm produced. She denied chest pain, SOB, palpitation, nausea, vomiting, diarrhea. She is afebrile, no O2 desaturation.   Past Medical History:  Diagnosis Date  . Acute upper respiratory infections of unspecified site 2013  . Cellulitis and abscess of hand, except fingers and thumb 10/04/2010  . Degeneration of intervertebral disc, site unspecified 2000  . Diaphragmatic hernia without mention of obstruction or gangrene 08/15/1998  . Diverticulosis of colon (without mention of hemorrhage) 2000  . Hearing loss 09/28/2015  . Hyperlipidemia 2007  . Hypertension 2007  . Impacted cerumen   . Loss of weight 04/25/2011  . Myalgia and myositis, unspecified 01/07/2007  . Nontoxic uninodular goiter 08/15/1998  . Osteoarthrosis, unspecified whether generalized or localized, unspecified site 07/22/2000  . Osteoporosis 2000  . Other drug allergy(995.27) 04/04/2011  . Pain in limb 2012  . Personal history of fall 2012  . Pulmonary hypertension (HCC) 07/25/14   Mild-moderate  . Spinal stenosis, unspecified region other than cervical 2002  . Sprain of ankle, left   .  Supraventricular premature beats 10/2006  . Tricuspid regurgitation 07/25/14   Mild-moderate  . 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 Urinary tract infection, site not specified   . Urine frequency 01/20/2014  . Urticaria, unspecified 11/01/2008  . Vitamin D deficiency 04/25/2011    Past Surgical History:  Procedure Laterality Date  . BACK SURGERY  1990   HNP L3-4 Dr. 11/03/2008  . CATARACT EXTRACTION W/ INTRAOCULAR LENS IMPLANT Right 05/19/2014   Dr. Lynnette Caffey  . COLONOSCOPY  1992   acute segmental colitis Dr. 07/19/2014  . FACIAL COSMETIC SURGERY  1995   Dr. Dione Booze  . LARYNGOSCOPY  1987   and biopsy  Dr. Russella Dar    Allergies  Allergen Reactions  . Doxycycline Itching  . Lipitor [Atorvastatin] Other (See Comments)    PAIN IN LEGS  . Pantoprazole Itching  . Pepcid [Famotidine] Itching    Allergies as of 03/20/2017      Reactions   Doxycycline Itching   Lipitor [atorvastatin] Other (See Comments)   PAIN IN LEGS   Pantoprazole Itching   Pepcid [famotidine] Itching      Medication List        Accurate as of 03/20/17 11:59 PM. Always use your most recent med list.          aspirin EC 81 MG tablet Take 81 mg by mouth at bedtime.   cholecalciferol 1000 units tablet Commonly known as:  VITAMIN D Take 1,000 Units by mouth daily.   docusate sodium 100 MG capsule Commonly known as:  COLACE Take 100 mg by mouth 3 (three) times daily. 7:30am, 2pm, and bedtime -to prevent constipation   guaiFENesin 600 MG 12 hr tablet Commonly known as:  MUCINEX Take 1 tablet (600 mg total) by mouth 2 (two) times daily for 5 days.   HYDROcodone-acetaminophen 5-325 MG tablet Commonly known as:  NORCO/VICODIN Take 1 tablet by mouth 3 (three) times daily. 7:30am, 2pm, and bedtime. Provided by DR RAMOS office.   Magnesium 250 MG Tabs Take 250 mg by mouth daily at 2 PM.   methylPREDNISolone 4 MG Tbpk tablet Commonly known as:  MEDROL DOSEPAK Take 1 tablet (4  mg total) by mouth taper from 4 doses each day to 1 dose and stop for 4 days, THEN 1 tablet (4 mg total) taper from 4 doses each day to 1 dose and stop for 4 days. Start taking on:  03/20/2017   metoprolol succinate 25 MG 24 hr tablet Commonly known as:  TOPROL-XL TAKE 1 TABLET ONCE DAILY.   multivitamin with minerals Tabs tablet Take 1 tablet by mouth daily at 2 PM.   PRESERVISION AREDS 2 Caps Take 1 capsule by mouth 2 (two) times daily.   rosuvastatin 5 MG tablet Commonly known as:  CRESTOR Take 1 tablet (5 mg total) by mouth daily.       Review of Systems:  Review of Systems  Constitutional: Negative for activity change, appetite change, chills, diaphoresis, fatigue and fever.  HENT: Positive for hearing loss. Negative for congestion, trouble swallowing and voice change.   Respiratory: Positive for cough. Negative for choking, chest tightness, shortness of breath and wheezing.   Cardiovascular: Negative for chest pain, palpitations and leg swelling.  Gastrointestinal: Negative for abdominal distention and abdominal pain.  Musculoskeletal: Positive for gait problem.  Skin: Negative for color change, pallor and rash.  Psychiatric/Behavioral: Negative for agitation and behavioral problems.    Health Maintenance  Topic Date Due  . DEXA SCAN  09/02/1991  . PNA vac Low Risk Adult (1 of 2 - PCV13) 03/11/1992  . TETANUS/TDAP  04/24/2021  . INFLUENZA VACCINE  Completed    Physical Exam: Vitals:   03/20/17 1516  BP: 130/78  Pulse: 66  Resp: 18  Temp: 98 F (36.7 C)  SpO2: [place]%  Weight: 124 lb 6.4 oz (56.4 kg)  Height: 5\' 3"  (1.6 m)   Body mass index is 22.04 kg/m. Physical Exam  Constitutional: She is oriented to person, place, and time. She appears well-developed and well-nourished. No distress.  HENT:  Head: Normocephalic and atraumatic.  Mouth/Throat: Oropharynx is clear and moist. No oropharyngeal exudate.  Eyes: Conjunctivae and EOM are normal. Pupils are equal,  round, and reactive to light.  Neck: Normal range of motion. Neck supple. No JVD present. No thyromegaly present.  Cardiovascular: Normal rate, regular rhythm and normal heart sounds.  No murmur heard. Pulmonary/Chest: Effort normal. No respiratory distress. She has no wheezes. She has no rales. She exhibits no tenderness.  Central congestion  Abdominal: Soft. Bowel sounds are normal. She exhibits no distension. There is no tenderness.  Musculoskeletal: Normal range of motion. She exhibits no edema or tenderness.  Ambulates with walker  Neurological: She is alert and oriented to person, place, and time. She exhibits normal muscle tone. Coordination normal.  Skin: Skin is warm and dry. She is not diaphoretic.  Psychiatric: She has a normal mood and affect. Her behavior is normal.    Labs reviewed: Basic Metabolic Panel: Recent Labs    11/20/16 0000  NA  138  K 4.5  CL 100  CO2 32  GLUCOSE 83  BUN 19  CREATININE 1.03*  CALCIUM 9.5   Liver Function Tests: Recent Labs    11/20/16 0000  AST 18  ALT 10  BILITOT 0.5  PROT 6.7   No results for input(s): LIPASE, AMYLASE in the last 8760 hours. No results for input(s): AMMONIA in the last 8760 hours. CBC: Recent Labs    12/09/16 0000  WBC 5.6  NEUTROABS 2,503  HGB 11.9  HCT 36.4  MCV 86.9  PLT 272   Lipid Panel: Recent Labs    11/20/16 0000  CHOL 108  HDL 54  TRIG 81  CHOLHDL 2.0   Lab Results  Component Value Date   HGBA1C 5.7 (H) 07/25/2014    Procedures since last visit: No results found.  Assessment/Plan Persistent cough Post acute bronchitis cough, will Mucinex 600mg  po bid x 5 days, Medrol dose pack. Observe.   Acute bronchitis treated for acute bronchitis since 03/13/17 with 5 day course of Augmentin 875mg  q12hs, Mucinex 600mg  bid x 5 days. She sleeps better at night, resolved chest and abd sore/aches, but her cough persisted during day while she is up and moving about, small amount clear phlegm  produced. She denied chest pain, SOB, palpitation, nausea, vomiting, diarrhea. She is afebrile, no O2 desaturation. Post infection inflammation and cough, will continue Mucinex, adding Medrol dose pack      Labs/tests ordered: none  Next appt:  05/13/2017  Time spend 25 minutes

## 2017-03-24 ENCOUNTER — Other Ambulatory Visit: Payer: Self-pay | Admitting: *Deleted

## 2017-03-24 DIAGNOSIS — J209 Acute bronchitis, unspecified: Secondary | ICD-10-CM

## 2017-03-24 MED ORDER — METHYLPREDNISOLONE 4 MG PO TBPK
ORAL_TABLET | ORAL | 0 refills | Status: DC
Start: 2017-03-24 — End: 2017-05-20

## 2017-03-24 MED ORDER — METHYLPREDNISOLONE 4 MG PO TBPK: ORAL_TABLET | ORAL | 0 refills | Status: DC

## 2017-03-27 ENCOUNTER — Non-Acute Institutional Stay: Payer: Medicare Other | Admitting: Nurse Practitioner

## 2017-03-27 ENCOUNTER — Encounter: Payer: Self-pay | Admitting: Nurse Practitioner

## 2017-03-27 DIAGNOSIS — R300 Dysuria: Secondary | ICD-10-CM

## 2017-03-27 DIAGNOSIS — R350 Frequency of micturition: Secondary | ICD-10-CM | POA: Diagnosis not present

## 2017-03-27 DIAGNOSIS — R509 Fever, unspecified: Secondary | ICD-10-CM | POA: Diagnosis not present

## 2017-03-27 DIAGNOSIS — J209 Acute bronchitis, unspecified: Secondary | ICD-10-CM | POA: Diagnosis not present

## 2017-03-27 NOTE — Assessment & Plan Note (Signed)
acute bronchitis, improved, treated with 5 day course of Augmentin, Mucinex, and Medrol dose pk.

## 2017-03-27 NOTE — Patient Instructions (Signed)
UA C/S today, next lab CBC/diff, CMP

## 2017-03-27 NOTE — Assessment & Plan Note (Addendum)
Along with symptoms of urinary frequency, urgency, lower abdominal discomfort x 2 days, will obtain UA C/S, update CBC/diff, CMP. Pyridium 100mg  po tid x 2 days.

## 2017-03-27 NOTE — Progress Notes (Signed)
Location:   Clinic [facility]   Place of Service:  Clinic (12) Provider: Chipper Oman NP  Code Status: DNR Goals of Care: [place] Advanced Directives 03/20/2017  Does Patient Have a Medical Advance Directive? Yes  Type of Estate agent of [place];Living will  Does patient want to make changes to medical advance directive? No - Patient declined  Copy of Healthcare Power of Attorney in Chart? Yes     Chief Complaint  Patient presents with  . Acute Visit    abd. pain,pain when she urinate,    HPI: Patient is a 84 y.o. Cox seen today for an acute visit for urinary frequency, dysuria, urgency, lower abdominal discomfort for 2 days, she is afebrile, denied blood in urine,  nausea, vomiting, constipation, or diarrhea.   Acute bronchitis, improved, treated with 5 day course of Augmentin, Mucinex, and Medrol dose pk.   Past Medical History:  Diagnosis Date  . Acute upper respiratory infections of unspecified site 2013  . Cellulitis and abscess of hand, except fingers and thumb 10/04/2010  . Degeneration of intervertebral disc, site unspecified 2000  . Diaphragmatic hernia without mention of obstruction or gangrene 08/15/1998  . Diverticulosis of colon (without mention of hemorrhage) 2000  . Hearing loss 09/28/2015  . Hyperlipidemia 2007  . Hypertension 2007  . Impacted cerumen   . Loss of weight 04/25/2011  . Myalgia and myositis, unspecified 01/07/2007  . Nontoxic uninodular goiter 08/15/1998  . Osteoarthrosis, unspecified whether generalized or localized, unspecified site 07/22/2000  . Osteoporosis 2000  . Other drug allergy(995.27) 04/04/2011  . Pain in limb 2012  . Personal history of fall 2012  . Pulmonary hypertension (HCC) 07/25/14   Mild-moderate  . Spinal stenosis, unspecified region other than cervical 2002  . Sprain of ankle, left   . Supraventricular premature beats 10/2006  . Tricuspid regurgitation 07/25/14   Mild-moderate  . Unspecified late effects of  cerebrovascular disease 01/01/2006  . Urinary tract infection, site not specified   . Urine frequency 01/20/2014  . Urticaria, unspecified 11/01/2008  . Vitamin D deficiency 04/25/2011    Past Surgical History:  Procedure Laterality Date  . BACK SURGERY  1990   HNP L3-4 Dr. Lynnette Caffey  . CATARACT EXTRACTION W/ INTRAOCULAR LENS IMPLANT Right 05/19/2014   Dr. Dione Booze  . COLONOSCOPY  1992   acute segmental colitis Dr. Russella Dar  . FACIAL COSMETIC SURGERY  1995   Dr. Charolotte Eke  . LARYNGOSCOPY  1987   and biopsy  Dr. Haroldine Laws    Allergies  Allergen Reactions  . Doxycycline Itching  . Lipitor [Atorvastatin] Other (See Comments)    PAIN IN LEGS  . Pantoprazole Itching  . Pepcid [Famotidine] Itching    Allergies as of 03/27/2017      Reactions   Doxycycline Itching   Lipitor [atorvastatin] Other (See Comments)   PAIN IN LEGS   Pantoprazole Itching   Pepcid [famotidine] Itching      Medication List        Accurate as of 03/27/17 11:59 PM. Always use your most recent med list.          aspirin EC 81 MG tablet Take 81 mg by mouth at bedtime.   cholecalciferol 1000 units tablet Commonly known as:  VITAMIN D Take 1,000 Units by mouth daily.   docusate sodium 100 MG capsule Commonly known as:  COLACE Take 100 mg by mouth 3 (three) times daily. 7:30am, 2pm, and bedtime -to prevent constipation   HYDROcodone-acetaminophen 5-325 MG tablet  Commonly known as:  NORCO/VICODIN Take 1 tablet by mouth 3 (three) times daily. 7:30am, 2pm, and bedtime. Provided by DR RAMOS office.   Magnesium 250 MG Tabs Take 250 mg by mouth daily at 2 PM.   methylPREDNISolone 4 MG Tbpk tablet Commonly known as:  MEDROL DOSEPAK Take 6 tablet (4 mg total) by mouth taper down a dose each day to 1 dose and stop,   metoprolol succinate 25 MG 24 hr tablet Commonly known as:  TOPROL-XL TAKE 1 TABLET ONCE DAILY.   multivitamin with minerals Tabs tablet Take 1 tablet by mouth daily at 2 PM.     phenazopyridine 100 MG tablet Commonly known as:  PYRIDIUM Take 1 tablet (100 mg total) by mouth 3 (three) times daily as needed for up to 2 days for pain.   PRESERVISION AREDS 2 Caps Take 1 capsule by mouth 2 (two) times daily.   rosuvastatin 5 MG tablet Commonly known as:  CRESTOR Take 1 tablet (5 mg total) by mouth daily.       Review of Systems:  Review of Systems  Constitutional: Negative for activity change, appetite change, chills, diaphoresis, fatigue and fever.  HENT: Positive for hearing loss. Negative for trouble swallowing.   Eyes: Negative for visual disturbance.  Respiratory: Negative for cough, choking, chest tightness, shortness of breath and wheezing.   Cardiovascular: Negative for chest pain, palpitations and leg swelling.  Gastrointestinal: Negative for abdominal distention, abdominal pain, constipation, diarrhea, nausea and vomiting.       Lower abd discomfort.   Genitourinary: Positive for dysuria, frequency and urgency. Negative for difficulty urinating and hematuria.  Musculoskeletal: Positive for back pain and gait problem. Negative for arthralgias.  Psychiatric/Behavioral: Negative for agitation, behavioral problems and confusion.    Health Maintenance  Topic Date Due  . DEXA SCAN  09/02/1991  . PNA vac Low Risk Adult (1 of 2 - PCV13) 03/11/1992  . TETANUS/TDAP  04/24/2021  . INFLUENZA VACCINE  Completed    Physical Exam: Vitals:   03/27/17 1515  BP: 140/80  Pulse: Jasmine  Resp: 20  Temp: 98.5 F (36.9 C)  TempSrc: Oral  SpO2: 95%  Weight: 118 lb 9.6 oz (53.8 kg)  Height: 5\' 3"  (1.6 m)   Body mass index is 21.01 kg/m. Physical Exam  Constitutional: She is oriented to person, place, and time. She appears well-developed and well-nourished.  HENT:  Head: Normocephalic and atraumatic.  Eyes: Conjunctivae and EOM are normal. Pupils are equal, round, and reactive to light.  Neck: Normal range of motion. Neck supple. No JVD present. No  thyromegaly present.  Cardiovascular: Normal rate, regular rhythm and normal heart sounds.  No murmur heard. Pulmonary/Chest: Effort normal. She has no wheezes.  Scattered rhonchi  Abdominal: Soft. Bowel sounds are normal. She exhibits no distension. There is tenderness. There is no rebound and no guarding.  Suprapubic discomfort on palpation.   Musculoskeletal: Normal range of motion. She exhibits no edema or tenderness.  Neurological: She is alert and oriented to person, place, and time. She exhibits normal muscle tone. Coordination normal.  Skin: Skin is warm and dry.  Psychiatric: She has a normal mood and affect. Her behavior is normal.    Labs reviewed: Basic Metabolic Panel: Recent Labs    11/20/16 0000  NA 138  K 4.5  CL 100  CO2 32  GLUCOSE 83  BUN 19  CREATININE 1.03*  CALCIUM 9.5   Liver Function Tests: Recent Labs    11/20/16 0000  AST  18  ALT 10  BILITOT 0.5  PROT 6.7   No results for input(s): LIPASE, AMYLASE in the last 8760 hours. No results for input(s): AMMONIA in the last 8760 hours. CBC: Recent Labs    12/09/16 0000  WBC 5.6  NEUTROABS 2,503  HGB 11.9  HCT 36.4  MCV 86.9  PLT 272   Lipid Panel: Recent Labs    11/20/16 0000  CHOL 108  HDL 54  TRIG 81  CHOLHDL 2.0   Lab Results  Component Value Date   HGBA1C 5.7 (H) 07/25/2014    Procedures since last visit: No results found.  Assessment/Plan Dysuria Along with symptoms of urinary frequency, urgency, lower abdominal discomfort x 2 days, will obtain UA C/S, update CBC/diff, CMP. Pyridium 100mg  po tid x 2 days.   Acute bronchitis acute bronchitis, improved, treated with 5 day course of Augmentin, Mucinex, and Medrol dose pk.      Labs/tests ordered:  UA C/S, CBC/diff, CMP  Next appt:  05/13/2017  Time spend 25 minutes

## 2017-03-28 ENCOUNTER — Other Ambulatory Visit: Payer: Self-pay | Admitting: *Deleted

## 2017-03-28 ENCOUNTER — Encounter: Payer: Self-pay | Admitting: Nurse Practitioner

## 2017-03-28 ENCOUNTER — Telehealth: Payer: Self-pay | Admitting: *Deleted

## 2017-03-28 DIAGNOSIS — N390 Urinary tract infection, site not specified: Secondary | ICD-10-CM | POA: Insufficient documentation

## 2017-03-28 DIAGNOSIS — R319 Hematuria, unspecified: Principal | ICD-10-CM

## 2017-03-28 MED ORDER — NITROFURANTOIN MONOHYD MACRO 100 MG PO CAPS: 100.0000 mg | ORAL_CAPSULE | Freq: Two times a day (BID) | ORAL | 0 refills | Status: AC

## 2017-03-28 MED ORDER — PHENAZOPYRIDINE HCL 100 MG PO TABS: 100.0000 mg | ORAL_TABLET | Freq: Three times a day (TID) | ORAL | 0 refills | Status: AC | PRN

## 2017-03-28 NOTE — Telephone Encounter (Signed)
Called patient to inform her that the lab came back with a positive UTI, medication has been sent to the pharmacy and will arrive after 2:00 pm.

## 2017-05-13 ENCOUNTER — Encounter: Payer: Self-pay | Admitting: Internal Medicine

## 2017-05-20 ENCOUNTER — Encounter: Payer: Self-pay | Admitting: Internal Medicine

## 2017-05-20 ENCOUNTER — Non-Acute Institutional Stay: Payer: Medicare Other | Admitting: Internal Medicine

## 2017-05-20 VITALS — BP 132/64 | HR 71 | Temp 98.5°F | Resp 16 | Ht 63.0 in | Wt 118.8 lb

## 2017-05-20 DIAGNOSIS — M545 Low back pain: Secondary | ICD-10-CM

## 2017-05-20 DIAGNOSIS — I10 Essential (primary) hypertension: Secondary | ICD-10-CM | POA: Diagnosis not present

## 2017-05-20 DIAGNOSIS — E785 Hyperlipidemia, unspecified: Secondary | ICD-10-CM

## 2017-05-20 DIAGNOSIS — Z8709 Personal history of other diseases of the respiratory system: Secondary | ICD-10-CM | POA: Diagnosis not present

## 2017-05-20 DIAGNOSIS — Z8673 Personal history of transient ischemic attack (TIA), and cerebral infarction without residual deficits: Secondary | ICD-10-CM | POA: Diagnosis not present

## 2017-05-20 DIAGNOSIS — Z0000 Encounter for general adult medical examination without abnormal findings: Secondary | ICD-10-CM

## 2017-05-20 DIAGNOSIS — G8929 Other chronic pain: Secondary | ICD-10-CM | POA: Diagnosis not present

## 2017-05-20 DIAGNOSIS — K219 Gastro-esophageal reflux disease without esophagitis: Secondary | ICD-10-CM

## 2017-05-20 DIAGNOSIS — E559 Vitamin D deficiency, unspecified: Secondary | ICD-10-CM | POA: Diagnosis not present

## 2017-05-20 DIAGNOSIS — K5909 Other constipation: Secondary | ICD-10-CM

## 2017-05-20 NOTE — Progress Notes (Signed)
[facility]  Provider: Blanchie Serve MD   Location:  [place] of Service:  Clinic (12)  PCP: Blanchie Serve, MD Patient Care Team: Blanchie Serve, MD as PCP - General (Internal Medicine) Pamella Pert, MD (Inactive) as Consulting Physician (Neurosurgery) Thornell Sartorius, MD as Consulting Physician (Otolaryngology) Ladene Artist, MD as Consulting Physician (Gastroenterology) Dyke Maes, OD as Consulting Physician (Optometry) Suella Broad, MD as Consulting Physician (Physical Medicine and Rehabilitation) Latanya Maudlin, MD as Consulting Physician (Orthopedic Surgery) [place], [facility] Skeet Latch, MD as Attending Physician (Cardiology) Hayden Pedro, MD as Consulting Physician (Ophthalmology) Mast, Man X, NP as Nurse Practitioner (Internal Medicine)  Extended Emergency Contact Information Primary Emergency Contact: Faustino Congress of [place] Phone: [phone] Mobile Phone: [phone] Relation: Son Secondary Emergency Contact: [place] of [place] Mobile Phone: [phone] Relation: Other  Code Status: DNR  Goals of Care: Advanced Directive information Advanced Directives 03/20/2017  Does Patient Have a Medical Advance Directive? Yes  Type of Paramedic of [place];Living will  Does patient want to make changes to medical advance directive? No - Patient declined  Copy of [place] in Chart? Yes      Chief Complaint  Patient presents with  . Annual Exam    physical exam  . Medication Refill    No refills needed at this time.   Marland Kitchen MMSE    29/30. Passed clock drawing     HPI: Patient is a 84 y.o. female seen today for annual exam.   Hypertension- currently on metoprolol succinate 25 mg daily with aspirin 81 mg daily. BP controlled. Denies any symptom this visit.   hyperlipidemia- currently on crestor 5  mg daily, tolerating it well.   Constipation- takes colace 100 mg tid, denies constipation  History of arrhythmia- no symptoms reported. Followed by cardiology. On metoprolol succinate.   Chronic back pain- uses walker for ambulation, no fall reported. Currently on norco 5-325 mg tid and followed by Dr Nelva Bush  History of CVA- currently on aspirin and statin.   gerd- controlled symptom, not on any medications  Recurrent pleural effusion- breathing is better. Was treated for acute bronchitis recently and symptoms have improved. Denies dyspnea with exertion.  HPI: Patient is a 84 y.o. female seen today for annual exam.   Hypertension- currently on metoprolol succinate 25 mg daily with aspirin 81 mg daily.   hyperlipidemia- currently on crestor 5 mg daily, tolerating it well.   Constipation- takes colace 100 mg tid, denies constipation  History of arrhythmia- no symptoms reported. Followed by cardiology. On metoprolol succinate.   Chronic back pain- uses walker for ambulation, no fall reported. Currently on norco 5-325 mg tid and followed by Dr Nelva Bush  History of CVA- currently on aspirin and statin.   gerd- controlled symptom, not on any medications  Recurrent pleural effusion- breathing is better. Was treated for acute bronchitis recently and symptoms have improved. Denies dyspnea with exertion.     Past Medical History:  Diagnosis Date  . Acute upper respiratory infections of unspecified site 2013  . Cellulitis and abscess of hand, except fingers and thumb 10/04/2010  . Degeneration of intervertebral disc, site unspecified 2000  . Diaphragmatic hernia without mention of obstruction or gangrene 08/15/1998  . Diverticulosis of colon (without mention of hemorrhage) 2000  . Hearing loss 09/28/2015  . Hyperlipidemia 2007  .  Hypertension 2007  . Impacted cerumen   . Loss of weight 04/25/2011  . Myalgia and myositis, unspecified 01/07/2007  . Nontoxic uninodular goiter  08/15/1998  . Osteoarthrosis, unspecified whether generalized or localized, unspecified site 07/22/2000  . Osteoporosis 2000  . Other drug allergy(995.27) 04/04/2011  . Pain in limb 2012  . Personal history of fall 2012  . Pulmonary hypertension ([place]) 07/25/14   Mild-moderate  . Spinal stenosis, unspecified region other than cervical 2002  . Sprain of ankle, left   . Supraventricular premature beats 10/2006  . Tricuspid regurgitation 07/25/14   Mild-moderate  . 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 Urinary tract infection, site not specified   . Urine frequency 01/20/2014  . Urticaria, unspecified 11/01/2008  . Vitamin D deficiency 04/25/2011   Past Surgical History:  Procedure Laterality Date  . BACK SURGERY  1990   HNP L3-4 Dr. Cheri Rous  . CATARACT EXTRACTION W/ INTRAOCULAR LENS IMPLANT Right 05/19/2014   Dr. Katy Fitch  . COLONOSCOPY  1992   acute segmental colitis Dr. Fuller Plan  . FACIAL COSMETIC SURGERY  1995   Dr. Jeanella Flattery  . LARYNGOSCOPY  1987   and biopsy  Dr. Ernesto Rutherford    reports that  has never smoked. she has never used smokeless tobacco. She reports that she does not drink alcohol or use drugs. Social History   Socioeconomic History  . Marital status: Widowed    Spouse name: Not on file  . Number of children: Not on file  . Years of education: Not on file  . Highest education level: Not on file  Social Needs  . Financial resource strain: Not on file  . Food insecurity - worry: Not on file  . Food insecurity - inability: Not on file  . Transportation needs - medical: Not on file  . Transportation needs - non-medical: Not on file  Occupational History  . Occupation: retired Sales promotion account executive  Tobacco Use  . Smoking status: Never Smoker  . Smokeless tobacco: Never Used  Substance and Sexual Activity  . Alcohol use: No  . Drug use: No  . Sexual activity: No  Other Topics Concern  . Not on file  Social History Narrative   Lives at [place] since 7/[address]   Widowed   Never smoke   No alcohol   Exercise none   Still drives, around town    [employer] with walker   Living Will , [place]           Functional Status Survey:    Family History  Problem Relation Age of Onset  . Stroke Mother   . Stroke Father   . Hypertension Brother   . Rheum arthritis Maternal Grandmother   . Pancreatic cancer Sister     Health Maintenance  Topic Date Due  . DEXA SCAN  09/02/1991  . PNA vac Low Risk Adult (1 of 2 - PCV13) 03/11/1992  . TETANUS/TDAP  04/24/2021  . INFLUENZA VACCINE  Completed    Allergies  Allergen Reactions  . Doxycycline Itching  . Lipitor [Atorvastatin] Other (See Comments)    PAIN IN LEGS  . Pantoprazole Itching  . Pepcid [Famotidine] Itching    Outpatient Encounter Medications as of 05/20/2017  Medication Sig  . aspirin EC 81 MG tablet Take 81 mg by mouth at bedtime.  . cholecalciferol (VITAMIN D) 1000 UNITS tablet Take 1,000 Units by mouth daily.   Marland Kitchen docusate sodium (COLACE) 100 MG capsule Take 100 mg by mouth 3 (  three) times daily. 7:30am, 2pm, and bedtime -to prevent constipation  . HYDROcodone-acetaminophen (NORCO/VICODIN) 5-325 MG per tablet Take 1 tablet by mouth 3 (three) times daily. 7:30am, 2pm, and bedtime. Provided by DR RAMOS office.   . Magnesium 250 MG TABS Take 250 mg by mouth daily at 2 PM.   . metoprolol succinate (TOPROL-XL) 25 MG 24 hr tablet TAKE 1 TABLET ONCE DAILY.  . Multiple Vitamin (MULTIVITAMIN WITH MINERALS) TABS tablet Take 1 tablet by mouth daily at 2 PM.  . Multiple Vitamins-Minerals (PRESERVISION AREDS 2) CAPS Take 1 capsule by mouth 2 (two) times daily.  . rosuvastatin (CRESTOR) 5 MG tablet Take 1 tablet (5 mg total) by mouth daily.  . [DISCONTINUED] methylPREDNISolone (MEDROL DOSEPAK) 4 MG TBPK tablet Take 6 tablet (4 mg total) by mouth taper down a dose each day to 1 dose and stop,   No facility-administered encounter medications on file as of 05/20/2017.     Review of systems: Constitutional: Negative for appetite change, chills, diaphoresis, fatigue and fever.  Eyes: Negative for pain, discharge, redness and itching.       Has reading glasses. S/p cataract surgery in past  Respiratory: Negative for cough, shortness of breath and wheezing.   Cardiovascular: Negative for chest pain and palpitations.  Gastrointestinal: Negative for abdominal pain, anal bleeding, blood in stool, constipation, diarrhea, nausea, rectal pain and vomiting.       Takes colace tid and takes fruits and vegetables to prevent constipation, keeps herself hydrated  Genitourinary: Negative for dysuria, flank pain and hematuria.       Wakes up 1-2 times at night to urinate. Denies urinary incontinence  Musculoskeletal: Positive for back pain and gait problem. Negative for neck pain and neck stiffness.       Uses walker to ambulate, no fall reported, chronic pain to her back, on chronic pain medication and f/b Dr Nelva Bush  Skin: Negative for rash and wound.  Neurological: Negative for dizziness, tremors, seizures, syncope, numbness and headaches.  Hematological: Bruises/bleeds easily.  Psychiatric/Behavioral: Negative for confusion, decreased concentration, dysphoric mood, self-injury, sleep disturbance and suicidal ideas.    Vitals:   05/20/17 0949  BP: 132/64  Pulse: 71  Resp: 16  Temp: 98.5 F (36.9 C)  TempSrc: Oral  SpO2: 96%  Weight: 118 lb 12.8 oz (53.9 kg)  Height: 5' 3"  (1.6 m)   Body mass index is 21.04 kg/m. Physical Exam  Constitutional: She is oriented to person, place, and time. She appears well-developed and well-nourished. No distress.  HENT:  Head: Normocephalic and atraumatic.  Right Ear: External ear normal.  Left Ear: External ear normal.  Nose: Nose normal.  Mouth/Throat: Oropharynx is clear and moist. No oropharyngeal exudate.  Eyes: Conjunctivae and EOM are normal. Pupils are equal, round, and reactive to light. Right eye exhibits no  discharge. Left eye exhibits no discharge. No scleral icterus.  Neck: Normal range of motion. Neck supple.  Cardiovascular: Normal rate, regular rhythm and intact distal pulses.  Pulmonary/Chest: Breath sounds normal. No respiratory distress. She has no wheezes. She has no rales.  Abdominal: Soft. Bowel sounds are normal. There is no tenderness. There is no rebound and no guarding.  Musculoskeletal: She exhibits no tenderness.  Trace ankle edema, varicose veins, can move all 4 extremities, walker for ambulation  Lymphadenopathy:    She has no cervical adenopathy.  Neurological: She is alert and oriented to person, place, and time. She has normal reflexes. She exhibits normal muscle tone.  Normal pinprick sensation  for monofilament and normal vibration sensation to legs and feet.   Skin: Skin is warm and dry. She is not diaphoretic.  Psychiatric: She has a normal mood and affect. Her behavior is normal.    Labs reviewed: Basic Metabolic Panel: Recent Labs    11/20/16 0000  NA 138  K 4.5  CL 100  CO2 32  GLUCOSE 83  BUN 19  CREATININE 1.03*  CALCIUM 9.5   Liver Function Tests: Recent Labs    11/20/16 0000  AST 18  ALT 10  BILITOT 0.5  PROT 6.7   No results for input(s): LIPASE, AMYLASE in the last 8760 hours. No results for input(s): AMMONIA in the last 8760 hours. CBC: Recent Labs    12/09/16 0000  WBC 5.6  NEUTROABS 2,503  HGB 11.9  HCT 36.4  MCV 86.9  PLT 272   Cardiac Enzymes: No results for input(s): CKTOTAL, CKMB, CKMBINDEX, TROPONINI in the last 8760 hours. BNP: Invalid input(s): POCBNP Lab Results  Component Value Date   HGBA1C 5.7 (H) 07/25/2014   Lab Results  Component Value Date   TSH 1.21 03/18/2016   Lab Results  Component Value Date   VITAMINB12 1122 (H) 09/19/2010   Lab Results  Component Value Date   FOLATE >20.0 09/19/2010   Lab Results  Component Value Date   IRON 18 (L) 09/19/2010   TIBC 207 (L) 09/19/2010   FERRITIN 62  12/09/2016    Lipid Panel: Recent Labs    11/20/16 0000  CHOL 108  HDL 54  TRIG 81  CHOLHDL 2.0   Lab Results  Component Value Date   HGBA1C 5.7 (H) 07/25/2014   Fall Risk  05/20/2017 05/09/2016 09/28/2015 06/08/2015 03/02/2015  Falls in the past year? No No No No No  Number falls in past yr: - - - - -  Injury with Fall? - - - - -  Risk for fall due to : - - - - -   Depression screen [facility] 2/9 05/20/2017 05/09/2016 09/28/2015  Decreased Interest 0 0 0  Down, Depressed, Hopeless 0 0 0  PHQ - 2 Score 0 0 0   MMSE - Mini Mental State Exam 05/20/2017 05/09/2016  Not completed: (No Data) -  Orientation to time 5 5  Orientation to Place 5 5  Registration 3 3  Attention/ Calculation 5 4  Recall 2 3  Language- name 2 objects 2 2  Language- repeat 1 1  Language- follow 3 step command 3 3  Language- read & follow direction 1 1  Write a sentence 1 1  Copy design 1 1  Total score 29 29   Procedures since last visit: No results found.  Assessment/Plan 1. Essential hypertension Continue metoprolol succinate for now with aspirin.  - CMP with eGFR([facility]); Future - Lipid Panel; Future  2. Gastroesophageal reflux disease without esophagitis Symptom self controlled, follows dietary restriction, continue this - CBC (no diff); Future  3. Chronic constipation C/w colace and hydration  4. Hyperlipidemia LDL goal <70 Continue statin - Lipid Panel; Future  5. Chronic midline low back pain without sciatica Continue hydrocodone-apap tid, f/u with dr Nelva Bush  6. History of cardioembolic cerebrovascular accident (CVA) Continue statin and aspirin fall precautions - CBC (no diff); Future  7. History of pleural effusion stable  8. Vitamin D deficiency Continue vitamin d supplement - Vitamin D, 1,25-dihydroxy; Future  9. Annual physical the patient is uptodate with immunization and screening tests. Medication reviewed. Needs lab work, scheduled. She was  counseled regarding prevention of  dental and periodontal disease, diet, regular sustained exercise for at least 30 minutes 5 times per week, the proper use of sunscreen and protective clothing, cholesterol and diabetes screening.   Labs/tests ordered:   Orders Placed This Encounter  Procedures  . CMP with eGFR([facility])  . Lipid Panel  . CBC (no diff)  . Vitamin D, 1,25-dihydroxy    Next appointment: 6 months  Communication: reviewed care plan with patient    Blanchie Serve, MD Internal Medicine [place], [place] [address] Cell Phone (Monday-Friday 8 am - 5 pm): [phone] On Call: [phone] and follow prompts after 5 pm and on weekends Office Phone: [phone] Office Fax: [phone]

## 2017-05-20 NOTE — Patient Instructions (Signed)
  We need a copy of your health care power of attorney paperwork and DNR form. Please bring it to the office when you can. Also review the MOST form that has been provided to you. Do talk about this form with your sons as well.

## 2017-05-27 DIAGNOSIS — Z8673 Personal history of transient ischemic attack (TIA), and cerebral infarction without residual deficits: Secondary | ICD-10-CM | POA: Diagnosis not present

## 2017-05-27 DIAGNOSIS — E559 Vitamin D deficiency, unspecified: Secondary | ICD-10-CM | POA: Diagnosis not present

## 2017-05-27 DIAGNOSIS — I10 Essential (primary) hypertension: Secondary | ICD-10-CM

## 2017-05-27 DIAGNOSIS — K219 Gastro-esophageal reflux disease without esophagitis: Secondary | ICD-10-CM

## 2017-05-27 DIAGNOSIS — E785 Hyperlipidemia, unspecified: Secondary | ICD-10-CM | POA: Diagnosis not present

## 2017-05-30 LAB — COMPLETE METABOLIC PANEL WITH GFR
AG Ratio: 1.5 (calc) (ref 1.0–2.5)
ALBUMIN MSPROF: 4.1 g/dL (ref 3.6–5.1)
ALT: 11 U/L (ref 6–29)
AST: 20 U/L (ref 10–35)
Alkaline phosphatase (APISO): 76 U/L (ref 33–130)
BILIRUBIN TOTAL: 0.5 mg/dL (ref 0.2–1.2)
BUN / CREAT RATIO: 19 (calc) (ref 6–22)
BUN: 19 mg/dL (ref 7–25)
CHLORIDE: 101 mmol/L (ref 98–110)
CO2: 31 mmol/L (ref 20–32)
Calcium: 9.5 mg/dL (ref 8.6–10.4)
Creat: 0.99 mg/dL — ABNORMAL HIGH (ref 0.60–0.88)
GFR, Est African American: 58 mL/min/{1.73_m2} — ABNORMAL LOW (ref >=60)
GFR, Est Non African American: 50 mL/min/{1.73_m2} — ABNORMAL LOW (ref >=60)
GLUCOSE: 88 mg/dL (ref 65–99)
Globulin: 2.8 g/dL (calc) (ref 1.9–3.7)
Potassium: 3.9 mmol/L (ref 3.5–5.3)
Sodium: 137 mmol/L (ref 135–146)
Total Protein: 6.9 g/dL (ref 6.1–8.1)

## 2017-05-30 LAB — CBC
HEMATOCRIT: 36.3 % (ref 35.0–45.0)
Hemoglobin: 12.6 g/dL (ref 11.7–15.5)
MCH: 28.8 pg (ref 27.0–33.0)
MCHC: 34.7 g/dL (ref 32.0–36.0)
MCV: 83.1 fL (ref 80.0–100.0)
MPV: 9.4 fL (ref 7.5–12.5)
PLATELETS: 290 10*3/uL (ref 140–400)
RBC: 4.37 10*6/uL (ref 3.80–5.10)
RDW: 13.9 % (ref 11.0–15.0)
WBC: 7.6 10*3/uL (ref 3.8–10.8)

## 2017-05-30 LAB — LIPID PANEL
CHOLESTEROL: 106 mg/dL (ref <200)
HDL: 47 mg/dL — AB (ref >50)
LDL Cholesterol (Calc): 42 mg/dL (calc)
Non-HDL Cholesterol (Calc): 59 mg/dL (calc) (ref <130)
TRIGLYCERIDES: 83 mg/dL (ref <150)
Total CHOL/HDL Ratio: 2.3 (calc) (ref <5.0)

## 2017-05-30 LAB — VITAMIN D 1,25 DIHYDROXY
VITAMIN D 1, 25 (OH) TOTAL: 30 pg/mL (ref 18–72)
Vitamin D2 1, 25 (OH)2: <8 pg/mL
Vitamin D3 1, 25 (OH)2: 30 pg/mL

## 2017-06-03 ENCOUNTER — Telehealth: Payer: Self-pay

## 2017-06-03 ENCOUNTER — Other Ambulatory Visit: Payer: Self-pay | Admitting: Cardiovascular Disease

## 2017-06-03 DIAGNOSIS — I10 Essential (primary) hypertension: Secondary | ICD-10-CM

## 2017-06-03 NOTE — Telephone Encounter (Signed)
Patient stated that she would to like to get some hearing aids from [facility] when the come to the facility. Patient stated that she would need a referral from Dr. Glade Lloyd in order for her to receive hearing aids. Please advise.

## 2017-06-03 NOTE — Telephone Encounter (Signed)
Please review for refill, Thanks !  

## 2017-06-04 DIAGNOSIS — M545 Low back pain: Secondary | ICD-10-CM | POA: Diagnosis not present

## 2017-06-04 DIAGNOSIS — G894 Chronic pain syndrome: Secondary | ICD-10-CM | POA: Diagnosis not present

## 2017-06-04 NOTE — Telephone Encounter (Signed)
Scripted provided

## 2017-06-30 DIAGNOSIS — H903 Sensorineural hearing loss, bilateral: Secondary | ICD-10-CM | POA: Diagnosis not present

## 2017-08-19 DIAGNOSIS — H26491 Other secondary cataract, right eye: Secondary | ICD-10-CM | POA: Diagnosis not present

## 2017-08-19 DIAGNOSIS — H43813 Vitreous degeneration, bilateral: Secondary | ICD-10-CM | POA: Diagnosis not present

## 2017-08-19 DIAGNOSIS — H353132 Nonexudative age-related macular degeneration, bilateral, intermediate dry stage: Secondary | ICD-10-CM | POA: Diagnosis not present

## 2017-08-19 DIAGNOSIS — H04123 Dry eye syndrome of bilateral lacrimal glands: Secondary | ICD-10-CM | POA: Diagnosis not present

## 2017-08-19 DIAGNOSIS — Z961 Presence of intraocular lens: Secondary | ICD-10-CM | POA: Diagnosis not present

## 2017-08-27 ENCOUNTER — Ambulatory Visit (INDEPENDENT_AMBULATORY_CARE_PROVIDER_SITE_OTHER): Payer: Medicare Other | Admitting: Ophthalmology

## 2017-08-27 DIAGNOSIS — H353132 Nonexudative age-related macular degeneration, bilateral, intermediate dry stage: Secondary | ICD-10-CM

## 2017-08-27 DIAGNOSIS — H35033 Hypertensive retinopathy, bilateral: Secondary | ICD-10-CM

## 2017-08-27 DIAGNOSIS — I10 Essential (primary) hypertension: Secondary | ICD-10-CM | POA: Diagnosis not present

## 2017-08-27 DIAGNOSIS — H43813 Vitreous degeneration, bilateral: Secondary | ICD-10-CM | POA: Diagnosis not present

## 2017-09-01 ENCOUNTER — Other Ambulatory Visit: Payer: Self-pay | Admitting: Cardiovascular Disease

## 2017-09-01 DIAGNOSIS — I10 Essential (primary) hypertension: Secondary | ICD-10-CM

## 2017-10-06 ENCOUNTER — Other Ambulatory Visit: Payer: Self-pay | Admitting: Cardiovascular Disease

## 2017-10-06 DIAGNOSIS — I10 Essential (primary) hypertension: Secondary | ICD-10-CM

## 2017-10-13 ENCOUNTER — Encounter (INDEPENDENT_AMBULATORY_CARE_PROVIDER_SITE_OTHER): Payer: Medicare Other | Admitting: Ophthalmology

## 2017-10-13 DIAGNOSIS — H353132 Nonexudative age-related macular degeneration, bilateral, intermediate dry stage: Secondary | ICD-10-CM

## 2017-10-13 DIAGNOSIS — H318 Other specified disorders of choroid: Secondary | ICD-10-CM

## 2017-10-13 DIAGNOSIS — H35033 Hypertensive retinopathy, bilateral: Secondary | ICD-10-CM | POA: Diagnosis not present

## 2017-10-13 DIAGNOSIS — H04123 Dry eye syndrome of bilateral lacrimal glands: Secondary | ICD-10-CM | POA: Diagnosis not present

## 2017-10-13 DIAGNOSIS — H43813 Vitreous degeneration, bilateral: Secondary | ICD-10-CM | POA: Diagnosis not present

## 2017-10-13 DIAGNOSIS — Z961 Presence of intraocular lens: Secondary | ICD-10-CM | POA: Diagnosis not present

## 2017-10-13 DIAGNOSIS — I10 Essential (primary) hypertension: Secondary | ICD-10-CM

## 2017-10-13 DIAGNOSIS — H26491 Other secondary cataract, right eye: Secondary | ICD-10-CM | POA: Diagnosis not present

## 2017-10-15 ENCOUNTER — Encounter (INDEPENDENT_AMBULATORY_CARE_PROVIDER_SITE_OTHER): Payer: Medicare Other | Admitting: Ophthalmology

## 2017-10-15 ENCOUNTER — Encounter (INDEPENDENT_AMBULATORY_CARE_PROVIDER_SITE_OTHER): Payer: Self-pay

## 2017-10-22 ENCOUNTER — Other Ambulatory Visit: Payer: Self-pay | Admitting: Cardiovascular Disease

## 2017-10-22 ENCOUNTER — Encounter: Payer: Self-pay | Admitting: Internal Medicine

## 2017-10-22 DIAGNOSIS — I10 Essential (primary) hypertension: Secondary | ICD-10-CM

## 2017-10-23 ENCOUNTER — Encounter (INDEPENDENT_AMBULATORY_CARE_PROVIDER_SITE_OTHER): Payer: Medicare Other | Admitting: Ophthalmology

## 2017-10-23 DIAGNOSIS — H318 Other specified disorders of choroid: Secondary | ICD-10-CM | POA: Diagnosis not present

## 2017-10-23 DIAGNOSIS — H3562 Retinal hemorrhage, left eye: Secondary | ICD-10-CM

## 2017-10-24 ENCOUNTER — Other Ambulatory Visit: Payer: Self-pay | Admitting: Cardiovascular Disease

## 2017-10-24 DIAGNOSIS — I10 Essential (primary) hypertension: Secondary | ICD-10-CM

## 2017-10-24 NOTE — Telephone Encounter (Signed)
° ° ° °*  STAT* If patient is at the pharmacy, call can be transferred to refill team.   1. Which medications need to be refilled? (please list name of each medication and dose if known) metoprolol succinate (TOPROL-XL) 25 MG 24 hr tablet  2. Which pharmacy/location (including street and city if local pharmacy) is medication to be sent to?[facility] - [place], [place] - [address]  3. Do they need a 30 day or 90 day supply?

## 2017-11-06 ENCOUNTER — Encounter: Payer: Self-pay | Admitting: Cardiovascular Disease

## 2017-11-06 ENCOUNTER — Ambulatory Visit: Payer: Medicare Other | Admitting: Cardiovascular Disease

## 2017-11-06 VITALS — BP 138/72 | HR 68 | Ht 62.0 in | Wt 123.0 lb

## 2017-11-06 DIAGNOSIS — I10 Essential (primary) hypertension: Secondary | ICD-10-CM

## 2017-11-06 DIAGNOSIS — R000 Tachycardia, unspecified: Secondary | ICD-10-CM

## 2017-11-06 DIAGNOSIS — E7800 Pure hypercholesterolemia, unspecified: Secondary | ICD-10-CM

## 2017-11-06 MED ORDER — METOPROLOL SUCCINATE ER 25 MG PO TB24: 25.0000 mg | ORAL_TABLET | Freq: Every day | ORAL | 3 refills | Status: DC

## 2017-11-06 NOTE — Patient Instructions (Addendum)
Medication Instructions:  STOP ASPIRIN   Labwork: NONE  Testing/Procedures: NONE  Follow-Up: Your physician wants you to follow-up in: 1 YEAR  You will receive a reminder letter in the mail two months in advance. If you don't receive a letter, please call our office to schedule the follow-up appointment.  If you need a refill on your cardiac medications before your next appointment, please call your pharmacy.

## 2017-11-06 NOTE — Progress Notes (Signed)
Cardiology Office Note   Date:  11/06/2017   ID:  IDABELL Cox, DOB [birth year], MRN [id number]  PCP:  Oneal Grout, MD  Cardiologist:   Chilton Si, MD   No chief complaint on file.    History of Present Illness: [name] is an 84 y.o. female with hypertension, hyperlipidemia, recurrent pleural effusions, PACs and PVCs who presents for follow up on arrhythmia.  [name] was initially evaluated on 05/2015 after being seen in the ED with a complaint of palpitations and pre-syncope.  In the ED telemetry showed sinus rhythm and sinus tachycardia up to the 120s.  Cardiac enzymes were negative and echo revealed LVEF 55-60% and grade 1 diastolic dysfunction.  TSH was normal. By the time she followed up in our office she denied recurrent symptoms.  Amlodipine was discontinued and she was started on metoprolol.  She followed up 08/2015 and was doing well.  [name] lives at [facility] and has many friends there.    Since her last appointment [name] has been well.  She has struggled with some vision loss in her eye.  She underwent laser surgery and has had improvement in vision.  She has not had any more episodes of heart racing.  Her breathing has been stable and she has not experienced any chest pain.  She likes to do a lot of walking at [place] friend's home.  She walks inside and outside when the weather is good.  She uses a walker when walking long distances and has experienced any falls.  She has no lower extremity edema, orthopnea, or PND.  She is not required any more thoracentesis since she changed her diet to eliminate foods that were causing GERD.  She wonders if she still needs to take aspirin.   Past Medical History:  Diagnosis Date  . Acute upper respiratory infections of unspecified site 2013  . Cellulitis and abscess of hand, except fingers and thumb 10/04/2010  . Degeneration of intervertebral disc, site unspecified 2000  . Diaphragmatic hernia without  mention of obstruction or gangrene 08/15/1998  . Diverticulosis of colon (without mention of hemorrhage) 2000  . Hearing loss 09/28/2015  . Hyperlipidemia 2007  . Hypertension 2007  . Impacted cerumen   . Loss of weight 04/25/2011  . Myalgia and myositis, unspecified 01/07/2007  . Nontoxic uninodular goiter 08/15/1998  . Osteoarthrosis, unspecified whether generalized or localized, unspecified site 07/22/2000  . Osteoporosis 2000  . Other drug allergy(995.27) 04/04/2011  . Pain in limb 2012  . Personal history of fall 2012  . Pulmonary hypertension (HCC) 07/25/14   Mild-moderate  . Spinal stenosis, unspecified region other than cervical 2002  . Sprain of ankle, left   . Supraventricular premature beats 10/2006  . Tricuspid regurgitation 07/25/14   Mild-moderate  . 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 Urinary tract infection, site not specified   . Urine frequency 01/20/2014  . Urticaria, unspecified 11/01/2008  . Vitamin D deficiency 04/25/2011    Past Surgical History:  Procedure Laterality Date  . BACK SURGERY  1990   HNP L3-4 Dr. Lynnette Caffey  . CATARACT EXTRACTION W/ INTRAOCULAR LENS IMPLANT Right 05/19/2014   Dr. Dione Booze  . COLONOSCOPY  1992   acute segmental colitis Dr. Russella Dar  . FACIAL COSMETIC SURGERY  1995   Dr. Charolotte Eke  . LARYNGOSCOPY  1987   and biopsy  Dr. Haroldine Laws     Current Outpatient Medications  Medication Sig Dispense Refill  .  cholecalciferol (VITAMIN D) 1000 UNITS tablet Take 1,000 Units by mouth daily.     Marland Kitchen docusate sodium (COLACE) 100 MG capsule Take 100 mg by mouth 3 (three) times daily. 7:30am, 2pm, and bedtime -to prevent constipation    . HYDROcodone-acetaminophen (NORCO/VICODIN) 5-325 MG per tablet Take 1 tablet by mouth 3 (three) times daily. 7:30am, 2pm, and bedtime. Provided by DR RAMOS office.     . Magnesium 250 MG TABS Take 250 mg by mouth daily at 2 PM.     . metoprolol succinate (TOPROL-XL) 25 MG 24 hr tablet Take 1  tablet (25 mg total) by mouth daily. 90 tablet 3  . Multiple Vitamins-Minerals (PRESERVISION AREDS 2) CAPS Take 1 capsule by mouth 2 (two) times daily.    . rosuvastatin (CRESTOR) 5 MG tablet Take 1 tablet (5 mg total) by mouth daily. 90 tablet 3   No current facility-administered medications for this visit.     Allergies:   Doxycycline; Lipitor [atorvastatin]; Pantoprazole; and Pepcid [famotidine]    Social History:  The patient  reports that she has never smoked. She has never used smokeless tobacco. She reports that she does not drink alcohol or use drugs.   Family History:  The patient's family history includes Hypertension in her brother; Pancreatic cancer in her sister; Rheum arthritis in her maternal grandmother; Stroke in her father and mother.    ROS:  Please see the history of present illness.   Otherwise, review of systems are positive for none.   All other systems are reviewed and negative.    PHYSICAL EXAM: VS:  BP 138/72   Pulse 68   Ht 5\' 2"  (1.575 m)   Wt 123 lb (55.8 kg)   SpO2 97% Comment: on room air  BMI 22.50 kg/m  , BMI Body mass index is 22.5 kg/m. GENERAL:  Well appearing elderly woman in no acute distress.  Appears younger than stated age.  HEENT: Pupils equal round and reactive, fundi not visualized, oral mucosa unremarkable NECK:  No jugular venous distention, waveform within normal limits, carotid upstroke brisk and symmetric, no bruits LUNGS:  Clear to auscultation bilaterally HEART:  RRR.  PMI not displaced or sustained,S1 and S2 within normal limits, no S3, no S4, no clicks, no rubs, no murmurs ABD:  Flat, positive bowel sounds normal in frequency in pitch, no bruits, no rebound, no guarding, no midline pulsatile mass, no hepatomegaly, no splenomegaly EXT:  2 plus pulses throughout, no edema, no cyanosis no clubbing SKIN:  No rashes no nodules NEURO:  Cranial nerves II through XII grossly intact, motor grossly intact throughout PSYCH:  Cognitively  intact, oriented to person place and time    EKG:  EKG is ordered today. The ekg ordered 08/21/15 demonstrates sinus rhythm. Rate 61 bpm. 08/26/16: Sinus rhythm. Rate 62 bpm. Low voltage limb leads and precordial leads. 11/06/17: Sinus rhythm.  Rate 65 bpm.  Low voltage.    Echo 05/01/15: Study Conclusions  - Left ventricle: The cavity size was normal. Systolic function was  normal. The estimated ejection fraction was in the range of 55%  to 60%. Wall motion was normal; there were no regional wall  motion abnormalities. There was an increased relative  contribution of atrial contraction to ventricular filling.  Doppler parameters are consistent with abnormal left ventricular  relaxation (grade 1 diastolic dysfunction). - Aortic valve: Trileaflet; mildly thickened, mildly calcified  leaflets. - Mitral valve: Calcified annulus. There was mild regurgitation. - Tricuspid valve: There was mild regurgitation.  Recent Labs: 05/27/2017: ALT 11; BUN 19; Creat 0.99; Hemoglobin 12.6; Platelets 290; Potassium 3.9; Sodium 137    Lipid Panel    Component Value Date/Time   CHOL 106 05/27/2017 0710   TRIG 83 05/27/2017 0710   HDL 47 (L) 05/27/2017 0710   CHOLHDL 2.3 05/27/2017 0710   VLDL 19 03/18/2016 0001   LDLCALC 42 05/27/2017 0710      Wt Readings from Last 3 Encounters:  11/06/17 123 lb (55.8 kg)  05/20/17 118 lb 12.8 oz (53.9 kg)  03/27/17 118 lb 9.6 oz (53.8 kg)      ASSESSMENT AND PLAN:  # Tachycardia, near syncope: No recurrent symptoms.  She is doing quite well.  Continue metoprolol.  # Hypertension: Blood pressure is slightly above her goal of 130/80. However given her age we will not make any changes at this gaol is <140/90.  Continue metopr9olol.    # Hyperlipidemia: LDL was 42 on 05/2017.  Continue Rosuvastatin.  Stop aspirin.    Current medicines are reviewed at length with the patient today.  The patient does not have concerns regarding medicines.  The  following changes have been made:  none  Labs/ tests ordered today include:   No orders of the defined types were placed in this encounter.   Disposition:   FU with Tayen Narang C. Duke Salvia, MD, [facility] in 1 year.     Signed, Ramiz Turpin C. Duke Salvia, MD, [facility]  11/06/2017 12:25 PM    [facility]

## 2017-11-06 NOTE — Addendum Note (Signed)
Addended by: Regis Bill B on: 11/06/2017 05:00 PM   Modules accepted: Orders

## 2017-11-17 ENCOUNTER — Other Ambulatory Visit: Payer: Self-pay | Admitting: Internal Medicine

## 2017-11-17 DIAGNOSIS — E785 Hyperlipidemia, unspecified: Secondary | ICD-10-CM

## 2017-11-18 ENCOUNTER — Non-Acute Institutional Stay: Payer: Medicare Other | Admitting: Internal Medicine

## 2017-11-18 ENCOUNTER — Encounter: Payer: Self-pay | Admitting: Internal Medicine

## 2017-11-18 VITALS — BP 120/72 | HR 68 | Temp 98.4°F | Resp 18 | Ht 62.0 in | Wt 121.4 lb

## 2017-11-18 DIAGNOSIS — H903 Sensorineural hearing loss, bilateral: Secondary | ICD-10-CM

## 2017-11-18 DIAGNOSIS — Z9181 History of falling: Secondary | ICD-10-CM | POA: Diagnosis not present

## 2017-11-18 DIAGNOSIS — M545 Low back pain, unspecified: Secondary | ICD-10-CM

## 2017-11-18 DIAGNOSIS — M47899 Other spondylosis, site unspecified: Secondary | ICD-10-CM

## 2017-11-18 DIAGNOSIS — R062 Wheezing: Secondary | ICD-10-CM

## 2017-11-18 DIAGNOSIS — I10 Essential (primary) hypertension: Secondary | ICD-10-CM | POA: Diagnosis not present

## 2017-11-18 DIAGNOSIS — E785 Hyperlipidemia, unspecified: Secondary | ICD-10-CM | POA: Diagnosis not present

## 2017-11-18 DIAGNOSIS — G8929 Other chronic pain: Secondary | ICD-10-CM

## 2017-11-18 MED ORDER — CALCIUM CARBONATE 600 MG PO TABS: 600.0000 mg | ORAL_TABLET | Freq: Two times a day (BID) | ORAL | 3 refills | Status: DC

## 2017-11-18 NOTE — Progress Notes (Signed)
[facility]  Provider: Blanchie Serve MD   Location:  [place] of Service:  Clinic (12)  PCP: Blanchie Serve, MD Patient Care Team: Blanchie Serve, MD as PCP - General (Internal Medicine) Skeet Latch, MD as PCP - Cardiology (Cardiology) Pamella Pert, MD (Inactive) as Consulting Physician (Neurosurgery) Thornell Sartorius, MD as Consulting Physician (Otolaryngology) Ladene Artist, MD as Consulting Physician (Gastroenterology) Dyke Maes, OD as Consulting Physician (Optometry) Suella Broad, MD as Consulting Physician (Physical Medicine and Rehabilitation) Latanya Maudlin, MD as Consulting Physician (Orthopedic Surgery) [place], [facility] Skeet Latch, MD as Attending Physician (Cardiology) Hayden Pedro, MD as Consulting Physician (Ophthalmology) Mast, Man X, NP as Nurse Practitioner (Internal Medicine)  Extended Emergency Contact Information Primary Emergency Contact: Faustino [place] of [place] Phone: [phone] Mobile Phone: [phone] Relation: Son Secondary Emergency Contact: [place] of [place] Mobile Phone: [phone] Relation: Other   Goals of Care: Advanced Directive information Advanced Directives 03/20/2017  Does Patient Have a Medical Advance Directive? Yes  Type of Paramedic of [place];Living will  Does patient want to make changes to medical advance directive? No - Patient declined  Copy of [place] in Chart? Yes      Chief Complaint  Patient presents with  . Medical Management of Chronic Issues    44mof/[id number]-    HPI: Patient is a 84y.o. female seen today for routine visit. She resides in independent living facility. She denies any concern. BP reading controlled. Tolerating metoprolol succinate well. She is seen by orthopedic for her chronic back pain and is currently taking norco 5-325  mg three times a day. Pain is under control with current regimen. She tries to incorporate more fruits and vegetables in her diet. Colace is helping keep her bowel movement regulated. Takes crestor for her cholesterol, tolerating it well.   Past Medical History:  Diagnosis Date  . Acute upper respiratory infections of unspecified site 2013  . Cellulitis and abscess of hand, except fingers and thumb 10/04/2010  . Degeneration of intervertebral disc, site unspecified 2000  . Diaphragmatic hernia without mention of obstruction or gangrene 08/15/1998  . Diverticulosis of colon (without mention of hemorrhage) 2000  . Hearing loss 09/28/2015  . Hyperlipidemia 2007  . Hypertension 2007  . Impacted cerumen   . Loss of weight 04/25/2011  . Myalgia and myositis, unspecified 01/07/2007  . Nontoxic uninodular goiter 08/15/1998  . Osteoarthrosis, unspecified whether generalized or localized, unspecified site 07/22/2000  . Osteoporosis 2000  . Other drug allergy(995.27) 04/04/2011  . Pain in limb 2012  . Personal history of fall 2012  . Pulmonary hypertension ([place] 07/25/14   Mild-moderate  . Spinal stenosis, unspecified region other than cervical 2002  . Sprain of ankle, left   . Supraventricular premature beats 10/2006  . Tricuspid regurgitation 07/25/14   Mild-moderate  . 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 Urinary tract infection, site not specified   . Urine frequency 01/20/2014  . Urticaria, unspecified 11/01/2008  . Vitamin D deficiency 04/25/2011   Past Surgical History:  Procedure Laterality Date  . BACK SURGERY  1990   HNP L3-4 Dr. BCheri Rous . CATARACT EXTRACTION W/ INTRAOCULAR LENS IMPLANT Right 05/19/2014   Dr. GKaty Fitch . COLONOSCOPY  1992   acute segmental colitis Dr. SFuller Plan . FACIAL COSMETIC SURGERY  1995   Dr.  Patseavouras  . LARYNGOSCOPY  1987   and biopsy  Dr. Ernesto Rutherford    reports that she has never smoked. She has never used smokeless tobacco. She  reports that she does not drink alcohol or use drugs. Social History   Socioeconomic History  . Marital status: Widowed    Spouse name: Not on file  . Number of children: Not on file  . Years of education: Not on file  . Highest education level: Not on file  Occupational History  . Occupation: retired Sales promotion account executive  Social Needs  . Financial resource strain: Not on file  . Food insecurity:    Worry: Not on file    Inability: Not on file  . Transportation needs:    Medical: Not on file    Non-medical: Not on file  Tobacco Use  . Smoking status: Never Smoker  . Smokeless tobacco: Never Used  Substance and Sexual Activity  . Alcohol use: No  . Drug use: No  . Sexual activity: Never  Lifestyle  . Physical activity:    Days per week: Not on file    Minutes per session: Not on file  . Stress: Not on file  Relationships  . Social connections:    Talks on phone: Not on file    Gets together: Not on file    Attends religious service: Not on file    Active member of club or organization: Not on file    Attends meetings of clubs or organizations: Not on file    Relationship status: Not on file  . Intimate partner violence:    Fear of current or ex partner: Not on file    Emotionally abused: Not on file    Physically abused: Not on file    Forced sexual activity: Not on file  Other Topics Concern  . Not on file  Social History Narrative   Lives at [place] since 7/[address]12   Widowed   Never smoke   No alcohol   Exercise none   Still drives, around town    [employer] with walker   Living Will , [place]           Functional Status Survey:    Family History  Problem Relation Age of Onset  . Stroke Mother   . Stroke Father   . Hypertension Brother   . Rheum arthritis Maternal Grandmother   . Pancreatic cancer Sister     Health Maintenance  Topic Date Due  . DEXA SCAN  09/02/1991  . PNA vac Low Risk Adult (1 of 2 - PCV13) 03/11/1992  . INFLUENZA VACCINE   10/09/2017  . TETANUS/TDAP  04/24/2021    Allergies  Allergen Reactions  . Doxycycline Itching  . Lipitor [Atorvastatin] Other (See Comments)    PAIN IN LEGS  . Pantoprazole Itching  . Pepcid [Famotidine] Itching    Outpatient Encounter Medications as of 11/18/2017  Medication Sig  . cholecalciferol (VITAMIN D) 1000 UNITS tablet Take 1,000 Units by mouth daily.   Marland Kitchen docusate sodium (COLACE) 100 MG capsule Take 100 mg by mouth 3 (three) times daily. 7:30am, 2pm, and bedtime -to prevent constipation  . HYDROcodone-acetaminophen (NORCO/VICODIN) 5-325 MG per tablet Take 1 tablet by mouth 3 (three) times daily. 7:30am, 2pm, and bedtime. Provided by DR RAMOS office.   . metoprolol succinate (TOPROL-XL) 25 MG 24 hr tablet Take 1 tablet (25 mg total) by mouth daily.  . Multiple Vitamins-Minerals (PRESERVISION AREDS 2) CAPS Take 1 capsule by mouth  2 (two) times daily.  . rosuvastatin (CRESTOR) 5 MG tablet TAKE 1 TABLET DAILY TO LOWER CHOLESTEROL.  . Magnesium 250 MG TABS Take 250 mg by mouth daily at 2 PM.    No facility-administered encounter medications on file as of 11/18/2017.     Review of Systems  Constitutional: Negative for appetite change, chills, fatigue and fever.  HENT: Positive for hearing loss. Negative for ear discharge, ear pain, mouth sores, rhinorrhea, sore throat and trouble swallowing.   Eyes: Negative for redness and itching.  Respiratory: Negative for cough, shortness of breath and wheezing.        Occasional cough with phlegm  Cardiovascular: Negative for chest pain, palpitations and leg swelling.  Gastrointestinal: Positive for constipation. Negative for abdominal pain, blood in stool, diarrhea, nausea and vomiting.  Genitourinary: Negative for dysuria, frequency, hematuria and pelvic pain.  Musculoskeletal: Positive for back pain and gait problem.       Walker for ambulation, no fall reported, last fall 2 years back  Skin: Negative for rash.  Neurological: Negative  for dizziness, weakness, numbness and headaches.  Psychiatric/Behavioral: Negative for decreased concentration, dysphoric mood and sleep disturbance. The patient is not nervous/anxious.     Vitals:   11/18/17 1100  BP: 120/72  Pulse: 68  Resp: 18  Temp: 98.4 F (36.9 C)  TempSrc: Oral  SpO2: 96%  Weight: 121 lb 6.4 oz (55.1 kg)  Height: 5' 2"  (1.575 m)   Body mass index is 22.2 kg/m.   Wt Readings from Last 3 Encounters:  11/18/17 121 lb 6.4 oz (55.1 kg)  11/06/17 123 lb (55.8 kg)  05/20/17 118 lb 12.8 oz (53.9 kg)   Physical Exam  Constitutional: She is oriented to person, place, and time.  Thin built, elderly female in no acute distress  HENT:  Head: Normocephalic and atraumatic.  Nose: Nose normal.  Mouth/Throat: Oropharynx is clear and moist. No oropharyngeal exudate.  Eyes: Pupils are equal, round, and reactive to light. Conjunctivae and EOM are normal. Right eye exhibits no discharge. Left eye exhibits no discharge.  Neck: Normal range of motion. Neck supple.  Cardiovascular: Normal rate and regular rhythm.  Pulmonary/Chest: Effort normal. No respiratory distress. She has wheezes. She has no rales. She exhibits no tenderness.  Abdominal: Soft. Bowel sounds are normal. There is no tenderness. There is no guarding.  Musculoskeletal: She exhibits no edema.  Scoliosis present, able to move all 4 extremities, unsteady gait, uses walker for ambulation  Lymphadenopathy:    She has no cervical adenopathy.  Neurological: She is alert and oriented to person, place, and time.  Skin: Skin is warm and dry. She is not diaphoretic.  Psychiatric: She has a normal mood and affect.    Labs reviewed: Basic Metabolic Panel: Recent Labs    11/20/16 0000 05/27/17 0710  NA 138 137  K 4.5 3.9  CL 100 101  CO2 32 31  GLUCOSE 83 88  BUN 19 19  CREATININE 1.03* 0.99*  CALCIUM 9.5 9.5   Liver Function Tests: Recent Labs    11/20/16 0000 05/27/17 0710  AST 18 20  ALT 10 11    BILITOT 0.5 0.5  PROT 6.7 6.9   No results for input(s): LIPASE, AMYLASE in the last 8760 hours. No results for input(s): AMMONIA in the last 8760 hours. CBC: Recent Labs    12/09/16 0000 05/27/17 0710  WBC 5.6 7.6  NEUTROABS 2,503  --   HGB 11.9 12.6  HCT 36.4 36.3  MCV  86.9 83.1  PLT 272 290   Cardiac Enzymes: No results for input(s): CKTOTAL, CKMB, CKMBINDEX, TROPONINI in the last 8760 hours. BNP: Invalid input(s): POCBNP Lab Results  Component Value Date   HGBA1C 5.7 (H) 07/25/2014   Lab Results  Component Value Date   TSH 1.21 03/18/2016   Lab Results  Component Value Date   VITAMINB12 1122 (H) 09/19/2010   Lab Results  Component Value Date   FOLATE >20.0 09/19/2010   Lab Results  Component Value Date   IRON 18 (L) 09/19/2010   TIBC 207 (L) 09/19/2010   FERRITIN 62 12/09/2016    Lipid Panel: Recent Labs    11/20/16 0000 05/27/17 0710  CHOL 108 106  HDL 54 47*  LDLCALC 38 42  TRIG 81 83  CHOLHDL 2.0 2.3   Lab Results  Component Value Date   HGBA1C 5.7 (H) 07/25/2014    Procedures since last visit: No results found.  Assessment/Plan  1. Sensorineural hearing loss (SNHL) of both ears Continue hearing aids  2. Essential hypertension Continue metoprolol succinate 25 mg daily and monitor. Check labs as below - CMP with eGFR([facility]); Future - Lipid Panel; Future - CBC (no diff); Future - TSH; Future  3. Hyperlipidemia LDL goal <70 Continue rosuvastatin and monitor lipid - Lipid Panel; Future - TSH; Future  4. Chronic midline low back pain without sciatica Advised to take calcium supplement. Continue vitamin d supplement. Continue current regimen of norco. Fall precautionss - calcium carbonate (OS-CAL) 600 MG TABS tablet; Take 1 tablet (600 mg total) by mouth 2 (two) times daily with a meal.  Dispense: 60 tablet; Refill: 3  5. History of fall Continue to use walker for ambulation, fall precautions.  - calcium carbonate (OS-CAL) 600  MG TABS tablet; Take 1 tablet (600 mg total) by mouth 2 (two) times daily with a meal.  Dispense: 60 tablet; Refill: 3  6. Other osteoarthritis of spine, unspecified spinal region Continue vitamin d supplement, start calcium as above. Does not want a dexa scan, explained benefits of it given her functionality.   7. Wheezing Pt mentions being told she has ? Asthma in past. No URI symptom, denies dyspnea, Old chest xray from 2016 showed hyperaeration with possible emphysema changes. Never been a smoker in the past. No reflux symptom reported. Monitor clinically for now. Pt refuses further workup for this. Consider rescue inhaler if needed.   Labs/tests ordered:   Lab Orders     CMP with eGFR([facility])     Lipid Panel     CBC (no diff)     TSH  Next appointment: 6 months for physical with MMSE, EKG  Communication: reviewed care plan with patient.     Blanchie Serve, MD Internal Medicine [facility] [address] [place], [place] [address] Cell Phone (Monday-Friday 8 am - 5 pm): [phone] On Call: [phone] and follow prompts after 5 pm and on weekends Office Phone: [phone] Office Fax: [phone]

## 2017-11-19 DIAGNOSIS — E785 Hyperlipidemia, unspecified: Secondary | ICD-10-CM | POA: Diagnosis not present

## 2017-11-19 DIAGNOSIS — I10 Essential (primary) hypertension: Secondary | ICD-10-CM | POA: Diagnosis not present

## 2017-11-20 DIAGNOSIS — E785 Hyperlipidemia, unspecified: Secondary | ICD-10-CM

## 2017-11-20 DIAGNOSIS — I10 Essential (primary) hypertension: Secondary | ICD-10-CM

## 2017-11-21 LAB — COMPLETE METABOLIC PANEL WITH GFR
AG Ratio: 1.5 (calc) (ref 1.0–2.5)
ALBUMIN MSPROF: 4 g/dL (ref 3.6–5.1)
ALT: 10 U/L (ref 6–29)
AST: 19 U/L (ref 10–35)
Alkaline phosphatase (APISO): 70 U/L (ref 33–130)
BUN / CREAT RATIO: 16 (calc) (ref 6–22)
BUN: 16 mg/dL (ref 7–25)
CALCIUM: 9.5 mg/dL (ref 8.6–10.4)
CO2: 31 mmol/L (ref 20–32)
CREATININE: 0.97 mg/dL — AB (ref 0.60–0.88)
Chloride: 101 mmol/L (ref 98–110)
GFR, Est African American: 59 mL/min/{1.73_m2} — ABNORMAL LOW (ref >=60)
GFR, Est Non African American: 51 mL/min/{1.73_m2} — ABNORMAL LOW (ref >=60)
GLOBULIN: 2.7 g/dL (ref 1.9–3.7)
Glucose, Bld: 90 mg/dL (ref 65–99)
Potassium: 4.4 mmol/L (ref 3.5–5.3)
SODIUM: 138 mmol/L (ref 135–146)
TOTAL PROTEIN: 6.7 g/dL (ref 6.1–8.1)
Total Bilirubin: 0.5 mg/dL (ref 0.2–1.2)

## 2017-11-21 LAB — LIPID PANEL
Cholesterol: 108 mg/dL (ref <200)
HDL: 45 mg/dL — ABNORMAL LOW (ref >50)
LDL Cholesterol (Calc): 46 mg/dL (calc)
Non-HDL Cholesterol (Calc): 63 mg/dL (calc) (ref <130)
Total CHOL/HDL Ratio: 2.4 (calc) (ref <5.0)
Triglycerides: 86 mg/dL (ref <150)

## 2017-11-21 LAB — CBC
HEMATOCRIT: 37 % (ref 35.0–45.0)
HEMOGLOBIN: 12.6 g/dL (ref 11.7–15.5)
MCH: 28.3 pg (ref 27.0–33.0)
MCHC: 34.1 g/dL (ref 32.0–36.0)
MCV: 83 fL (ref 80.0–100.0)
MPV: 9.4 fL (ref 7.5–12.5)
Platelets: 267 10*3/uL (ref 140–400)
RBC: 4.46 10*6/uL (ref 3.80–5.10)
RDW: 13.8 % (ref 11.0–15.0)
WBC: 6.3 10*3/uL (ref 3.8–10.8)

## 2017-11-21 LAB — TSH: TSH: 0.79 mIU/L (ref 0.40–4.50)

## 2017-11-24 ENCOUNTER — Encounter (INDEPENDENT_AMBULATORY_CARE_PROVIDER_SITE_OTHER): Payer: Medicare Other | Admitting: Ophthalmology

## 2017-11-24 DIAGNOSIS — H3562 Retinal hemorrhage, left eye: Secondary | ICD-10-CM

## 2017-12-01 DIAGNOSIS — G894 Chronic pain syndrome: Secondary | ICD-10-CM | POA: Diagnosis not present

## 2018-02-23 ENCOUNTER — Encounter (INDEPENDENT_AMBULATORY_CARE_PROVIDER_SITE_OTHER): Payer: Medicare Other | Admitting: Ophthalmology

## 2018-02-23 DIAGNOSIS — H353132 Nonexudative age-related macular degeneration, bilateral, intermediate dry stage: Secondary | ICD-10-CM

## 2018-02-23 DIAGNOSIS — H35033 Hypertensive retinopathy, bilateral: Secondary | ICD-10-CM

## 2018-02-23 DIAGNOSIS — H33303 Unspecified retinal break, bilateral: Secondary | ICD-10-CM

## 2018-02-23 DIAGNOSIS — H318 Other specified disorders of choroid: Secondary | ICD-10-CM

## 2018-02-23 DIAGNOSIS — I10 Essential (primary) hypertension: Secondary | ICD-10-CM

## 2018-02-23 DIAGNOSIS — H43813 Vitreous degeneration, bilateral: Secondary | ICD-10-CM

## 2018-03-05 ENCOUNTER — Telehealth: Payer: Self-pay

## 2018-03-05 ENCOUNTER — Other Ambulatory Visit: Payer: Self-pay | Admitting: *Deleted

## 2018-03-05 MED ORDER — OSELTAMIVIR PHOSPHATE 75 MG PO CAPS: 75.0000 mg | ORAL_CAPSULE | Freq: Every day | ORAL | 0 refills | Status: AC

## 2018-03-05 NOTE — Progress Notes (Signed)
Medication ordered and sent to [facility].

## 2018-03-05 NOTE — Telephone Encounter (Signed)
Tamiflu 75mg  po qd x 7 days.

## 2018-03-05 NOTE — Telephone Encounter (Signed)
Patient was around a small child whom tested positive for Flu (Flu A) and was told that anyone around the small child should have a preventive rx for Tamiflu. Patient is asymptomatic, yet would like a preventive as recommended by the child's doctor    Patient would like rx for Tamiflu sent to [facility] in [facility]  Please call patient ounce rx is sent in

## 2018-03-05 NOTE — Telephone Encounter (Signed)
Spoke with patient regarding medication (Tamiflu), Patient states that she understands the directions and will be home waiting for the delivery of her medication.

## 2018-05-21 ENCOUNTER — Other Ambulatory Visit: Payer: Self-pay | Admitting: Nurse Practitioner

## 2018-05-21 ENCOUNTER — Non-Acute Institutional Stay: Payer: Medicare Other | Admitting: Nurse Practitioner

## 2018-05-21 ENCOUNTER — Encounter: Payer: Self-pay | Admitting: Nurse Practitioner

## 2018-05-21 ENCOUNTER — Other Ambulatory Visit: Payer: Self-pay

## 2018-05-21 DIAGNOSIS — M545 Low back pain, unspecified: Secondary | ICD-10-CM

## 2018-05-21 DIAGNOSIS — G8929 Other chronic pain: Secondary | ICD-10-CM

## 2018-05-21 DIAGNOSIS — I10 Essential (primary) hypertension: Secondary | ICD-10-CM | POA: Diagnosis not present

## 2018-05-21 DIAGNOSIS — K5909 Other constipation: Secondary | ICD-10-CM | POA: Diagnosis not present

## 2018-05-21 DIAGNOSIS — E785 Hyperlipidemia, unspecified: Secondary | ICD-10-CM

## 2018-05-21 DIAGNOSIS — R413 Other amnesia: Secondary | ICD-10-CM

## 2018-05-21 NOTE — Patient Instructions (Signed)
Will f/u in clinic [facility] 6 months, update CBC/diff, CMP/eGFR, TSH, lipid panel prior to the appointment.

## 2018-05-21 NOTE — Assessment & Plan Note (Signed)
Continue Crestor, update lipids

## 2018-05-21 NOTE — Assessment & Plan Note (Addendum)
blood pressure is controlled, continue Metoprolol 23m qd. Update CMP/eGFR, lipid panel

## 2018-05-21 NOTE — Assessment & Plan Note (Addendum)
MMSE 28/30 05/21/18, update TSH.

## 2018-05-21 NOTE — Progress Notes (Signed)
Location:   clinic [place]   Place of Service:  Clinic (12) Provider: Marlana Latus NP  Code Status: DNR Goals of Care: [place] 03/20/2017  Does Patient Have a Medical Advance Directive? Yes  Type of Paramedic of [place];Living will  Does patient want to make changes to medical advance directive? No - Patient declined  Copy of [place] in Chart? Yes     Chief Complaint  Patient presents with  . Annual Exam    HPI: Patient is a 85 y.o. female seen today for medical management of chronic diseases.     The patient has history of HTN, blood pressure is controlled on Metoprolol 67m qd. Constipation, stable on Colace 1028mtid. OA pain, well managed on Norco 5/325 tid. [facility]    Past Medical History:  Diagnosis Date  . Acute upper respiratory infections of unspecified site 2013  . Cellulitis and abscess of hand, except fingers and thumb 10/04/2010  . Degeneration of intervertebral disc, site unspecified 2000  . Diaphragmatic hernia without mention of obstruction or gangrene 08/15/1998  . Diverticulosis of colon (without mention of hemorrhage) 2000  . Hearing loss 09/28/2015  . Hyperlipidemia 2007  . Hypertension 2007  . Impacted cerumen   . Loss of weight 04/25/2011  . Myalgia and myositis, unspecified 01/07/2007  . Nontoxic uninodular goiter 08/15/1998  . Osteoarthrosis, unspecified whether generalized or localized, unspecified site 07/22/2000  . Osteoporosis 2000  . Other drug allergy(995.27) 04/04/2011  . Pain in limb 2012  . Personal history of fall 2012  . Pulmonary hypertension [place]   Mild-moderate  . Spinal stenosis, unspecified region other than cervical 2002  . Sprain of ankle, left   . Supraventricular premature beats 10/2006  . Tricuspid regurgitation 07/25/14   Mild-moderate  . 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 Urinary tract  infection, site not specified   . Urine frequency 01/20/2014  . Urticaria, unspecified 11/01/2008  . Vitamin D deficiency 04/25/2011    Past Surgical History:  Procedure Laterality Date  . BACK SURGERY  1990   HNP L3-4 Dr. BlCheri Rous. CATARACT EXTRACTION W/ INTRAOCULAR LENS IMPLANT Right 05/19/2014   Dr. GrKaty Fitch. COLONOSCOPY  1992   acute segmental colitis Dr. StFuller Plan. FACIAL COSMETIC SURGERY  1995   Dr. PaJeanella Flattery. LARYNGOSCOPY  1987   and biopsy  Dr. CrErnesto Rutherford  Allergies  Allergen Reactions  . Doxycycline Itching  . Lipitor [Atorvastatin] Other (See Comments)    PAIN IN LEGS  . Pantoprazole Itching  . Pepcid [Famotidine] Itching    Allergies as of 05/21/2018      Reactions   Doxycycline Itching   Lipitor [atorvastatin] Other (See Comments)   PAIN IN LEGS   Pantoprazole Itching   Pepcid [famotidine] Itching      Medication List       Accurate as of May 21, 2018 11:59 PM. Always use your most recent med list.        cholecalciferol 1000 units tablet Commonly known as:  VITAMIN D Take 1,000 Units by mouth daily.   docusate sodium 100 MG capsule Commonly known as:  COLACE Take 100 mg by mouth 3 (three) times daily. 7:30am, 2pm, and bedtime -to prevent constipation   HYDROcodone-acetaminophen 5-325 MG tablet Commonly known as:  NORCO/VICODIN Take 1 tablet by mouth 3 (three) times daily. 7:30am, 2pm, and bedtime. Provided by DR RAMOS office.  Magnesium 250 MG Tabs Take 250 mg by mouth daily at 2 PM.   metoprolol succinate 25 MG 24 hr tablet Commonly known as:  TOPROL-XL Take 1 tablet (25 mg total) by mouth daily.   PreserVision AREDS 2 Caps Take 1 capsule by mouth 2 (two) times daily.   rosuvastatin 5 MG tablet Commonly known as:  CRESTOR TAKE 1 TABLET DAILY TO LOWER CHOLESTEROL.       Review of Systems:  Review of Systems  Constitutional: Negative for activity change, appetite change, chills, diaphoresis, fatigue, fever and unexpected  weight change.  HENT: Positive for hearing loss. Negative for congestion and voice change.   Eyes: Positive for discharge.  Respiratory: Negative for cough, shortness of breath and wheezing.   Cardiovascular: Negative for chest pain, palpitations and leg swelling.  Gastrointestinal: Negative for abdominal distention, abdominal pain, constipation, diarrhea, nausea and vomiting.  Genitourinary: Negative for difficulty urinating, dysuria and urgency.  Musculoskeletal: Positive for arthralgias, back pain and gait problem.  Neurological: Negative for dizziness, facial asymmetry, speech difficulty, weakness and headaches.  Psychiatric/Behavioral: Negative for agitation, behavioral problems, hallucinations and sleep disturbance. The patient is not nervous/anxious.     Health Maintenance  Topic Date Due  . DEXA SCAN  09/02/1991  . PNA vac Low Risk Adult (1 of 2 - PCV13) 03/11/1992  . TETANUS/TDAP  04/24/2021  . INFLUENZA VACCINE  Completed    Physical Exam: Vitals:   05/21/18 1410  BP: 130/72  Pulse: 82  Resp: 18  Temp: 98.1 F (36.7 C)  TempSrc: Oral  SpO2: 96%  Weight: 126 lb 9.6 oz (57.4 kg)  Height: 5' 2"  (1.575 m)   Body mass index is 23.16 kg/m. Physical Exam Constitutional:      General: She is not in acute distress.    Appearance: Normal appearance. She is normal weight. She is not ill-appearing, toxic-appearing or diaphoretic.  HENT:     Head: Normocephalic and atraumatic.     Nose: Nose normal.     Mouth/Throat:     Mouth: Mucous membranes are moist.  Eyes:     Extraocular Movements: Extraocular movements intact.     Pupils: Pupils are equal, round, and reactive to light.  Neck:     Musculoskeletal: Normal range of motion and neck supple.  Cardiovascular:     Rate and Rhythm: Normal rate and regular rhythm.     Heart sounds: No murmur.  Pulmonary:     Effort: Pulmonary effort is normal.     Breath sounds: Rales present.     Comments: bibasilar Abdominal:      General: There is no distension.     Palpations: Abdomen is soft.     Tenderness: There is no abdominal tenderness. There is no guarding or rebound.  Musculoskeletal:     Right lower leg: No edema.     Left lower leg: No edema.  Skin:    General: Skin is warm.  Neurological:     General: No focal deficit present.     Mental Status: She is alert. Mental status is at baseline.     Gait: Gait abnormal.     Comments: Oriented to person and place. Ambulates with walker.   Psychiatric:        Mood and Affect: Mood normal.        Behavior: Behavior normal.        Thought Content: Thought content normal.        Judgment: Judgment normal.     Labs  reviewed: Basic Metabolic Panel: Recent Labs    05/27/17 0710 11/19/17 0000  NA 137 138  K 3.9 4.4  CL 101 101  CO2 31 31  GLUCOSE 88 90  BUN 19 16  CREATININE 0.99* 0.97*  CALCIUM 9.5 9.5  TSH  --  0.79   Liver Function Tests: Recent Labs    05/27/17 0710 11/19/17 0000  AST 20 19  ALT 11 10  BILITOT 0.5 0.5  PROT 6.9 6.7   No results for input(s): LIPASE, AMYLASE in the last 8760 hours. No results for input(s): AMMONIA in the last 8760 hours. CBC: Recent Labs    05/27/17 0710 11/19/17 0000  WBC 7.6 6.3  HGB 12.6 12.6  HCT 36.3 37.0  MCV 83.1 83.0  PLT 290 267   Lipid Panel: Recent Labs    05/27/17 0710 11/19/17 0000  CHOL 106 108  HDL 47* 45*  LDLCALC 42 46  TRIG 83 86  CHOLHDL 2.3 2.4   Lab Results  Component Value Date   HGBA1C 5.7 (H) 07/25/2014    Procedures since last visit: No results found.  Assessment/Plan  Hypertension blood pressure is controlled, continue Metoprolol 47m qd. Update CMP/eGFR, lipid panel  Chronic constipation Stable, continue Colace 1080mtid.  Chronic back pain Pain is well managed, continue Norco 5/325 tid. Update CBC/diff   Impaired memory MMSE 28/30 05/21/18, update TSH.   Hyperlipidemia LDL goal <70 Continue Crestor, update lipids     Labs/tests ordered:   CBC/diff, CMP/eGFR, lipid panel, TSH   Next appt: 6 months

## 2018-05-21 NOTE — Assessment & Plan Note (Addendum)
Pain is well managed, continue Norco 5/325 tid. Update CBC/diff

## 2018-05-21 NOTE — Assessment & Plan Note (Signed)
Stable, continue Colace 100mg tid.  

## 2018-05-22 ENCOUNTER — Encounter: Payer: Self-pay | Admitting: Nurse Practitioner

## 2018-09-03 ENCOUNTER — Encounter (INDEPENDENT_AMBULATORY_CARE_PROVIDER_SITE_OTHER): Payer: Medicare Other | Admitting: Ophthalmology

## 2018-11-10 ENCOUNTER — Other Ambulatory Visit: Payer: Medicare Other

## 2018-11-10 ENCOUNTER — Other Ambulatory Visit: Payer: Self-pay

## 2018-11-10 DIAGNOSIS — G8929 Other chronic pain: Secondary | ICD-10-CM

## 2018-11-10 DIAGNOSIS — I10 Essential (primary) hypertension: Secondary | ICD-10-CM

## 2018-11-10 DIAGNOSIS — R413 Other amnesia: Secondary | ICD-10-CM

## 2018-11-11 LAB — LIPID PANEL
Cholesterol: 121 mg/dL (ref <200)
HDL: 44 mg/dL — ABNORMAL LOW (ref >=50)
LDL Cholesterol (Calc): 60 mg/dL (calc)
Non-HDL Cholesterol (Calc): 77 mg/dL (calc) (ref <130)
Total CHOL/HDL Ratio: 2.8 (calc) (ref <5.0)
Triglycerides: 87 mg/dL (ref <150)

## 2018-11-11 LAB — COMPLETE METABOLIC PANEL WITH GFR
AG Ratio: 1.4 (calc) (ref 1.0–2.5)
ALT: 7 U/L (ref 6–29)
AST: 17 U/L (ref 10–35)
Albumin: 3.9 g/dL (ref 3.6–5.1)
Alkaline phosphatase (APISO): 71 U/L (ref 37–153)
BUN/Creatinine Ratio: 16 (calc) (ref 6–22)
BUN: 16 mg/dL (ref 7–25)
CO2: 31 mmol/L (ref 20–32)
Calcium: 9.4 mg/dL (ref 8.6–10.4)
Chloride: 103 mmol/L (ref 98–110)
Creat: 0.98 mg/dL — ABNORMAL HIGH (ref 0.60–0.88)
GFR, Est African American: 58 mL/min/{1.73_m2} — ABNORMAL LOW (ref >=60)
GFR, Est Non African American: 50 mL/min/{1.73_m2} — ABNORMAL LOW (ref >=60)
Globulin: 2.7 g/dL (calc) (ref 1.9–3.7)
Glucose, Bld: 87 mg/dL (ref 65–99)
Potassium: 4.5 mmol/L (ref 3.5–5.3)
Sodium: 140 mmol/L (ref 135–146)
Total Bilirubin: 0.4 mg/dL (ref 0.2–1.2)
Total Protein: 6.6 g/dL (ref 6.1–8.1)

## 2018-11-11 LAB — CBC WITH DIFFERENTIAL/PLATELET
Absolute Monocytes: 493 cells/uL (ref 200–950)
Basophils Absolute: 51 cells/uL (ref 0–200)
Basophils Relative: 0.8 %
Eosinophils Absolute: 192 cells/uL (ref 15–500)
Eosinophils Relative: 3 %
HCT: 37.3 % (ref 35.0–45.0)
Hemoglobin: 12.3 g/dL (ref 11.7–15.5)
Lymphs Abs: 2931 cells/uL (ref 850–3900)
MCH: 29.4 pg (ref 27.0–33.0)
MCHC: 33 g/dL (ref 32.0–36.0)
MCV: 89.2 fL (ref 80.0–100.0)
MPV: 10 fL (ref 7.5–12.5)
Monocytes Relative: 7.7 %
Neutro Abs: 2733 cells/uL (ref 1500–7800)
Neutrophils Relative %: 42.7 %
Platelets: 262 10*3/uL (ref 140–400)
RBC: 4.18 10*6/uL (ref 3.80–5.10)
RDW: 13 % (ref 11.0–15.0)
Total Lymphocyte: 45.8 %
WBC: 6.4 10*3/uL (ref 3.8–10.8)

## 2018-11-11 LAB — TSH: TSH: 1.52 mIU/L (ref 0.40–4.50)

## 2018-11-12 ENCOUNTER — Other Ambulatory Visit: Payer: Self-pay

## 2018-11-12 ENCOUNTER — Non-Acute Institutional Stay: Payer: Medicare Other | Admitting: Nurse Practitioner

## 2018-11-12 ENCOUNTER — Encounter: Payer: Self-pay | Admitting: Nurse Practitioner

## 2018-11-12 DIAGNOSIS — M545 Low back pain: Secondary | ICD-10-CM

## 2018-11-12 DIAGNOSIS — K5909 Other constipation: Secondary | ICD-10-CM

## 2018-11-12 DIAGNOSIS — E785 Hyperlipidemia, unspecified: Secondary | ICD-10-CM

## 2018-11-12 DIAGNOSIS — R062 Wheezing: Secondary | ICD-10-CM

## 2018-11-12 DIAGNOSIS — I10 Essential (primary) hypertension: Secondary | ICD-10-CM | POA: Diagnosis not present

## 2018-11-12 DIAGNOSIS — R300 Dysuria: Secondary | ICD-10-CM

## 2018-11-12 DIAGNOSIS — G8929 Other chronic pain: Secondary | ICD-10-CM

## 2018-11-12 NOTE — Assessment & Plan Note (Signed)
Blood pressure is controlled, continue Metoprolol 25mg qd.  

## 2018-11-12 NOTE — Progress Notes (Signed)
Location:   clinic [facility]   Place of Service:  Clinic (12)clinic [facility] Provider: Chipper Oman NP  Code Status: DNR [place]als of Care[place] 11/12/2018  Does Patient Have a Medical Advance Directive? Yes  Type of Estate agent of [place];Living will  Does patient want to make changes to medical advance directive? No - Guardian declined  Copy of Healthcare Power of Attorney in Chart? -     Chief Complaint  Patient presents with  . Medical Management of Chronic Issues    6 month follow up with labs     HPI: Patient is a 85 y.o. female seen today for medical management of chronic diseases.    Hx of Hyperlipidemia, on Crestor 5mg  qd, 11/10/18 LDL 60, TSH 1.57. HTN, blood pressure is controlled, on Metoprolol 25mg  qd. OA pain, stable, on Norco 5/325mg  tid. Constipation, stable, on Coalce 100mg  tid.   C/o dysuria, urinary urgency x3 days, no blood in urine,  denied abd pain. She is afebrile.    Past Medical History:  Diagnosis Date  . Acute upper respiratory infections of unspecified site 2013  . Cellulitis and abscess of hand, except fingers and thumb 10/04/2010  . Degeneration of intervertebral disc, site unspecified 2000  . Diaphragmatic hernia without mention of obstruction or gangrene 08/15/1998  . Diverticulosis of colon (without mention of hemorrhage) 2000  . Hearing loss 09/28/2015  . Hyperlipidemia 2007  . Hypertension 2007  . Impacted cerumen   . Loss of weight 04/25/2011  . Myalgia and myositis, unspecified 01/07/2007  . Nontoxic uninodular goiter 08/15/1998  . Osteoarthrosis, unspecified whether generalized or localized, unspecified site 07/22/2000  . Osteoporosis 2000  . Other drug allergy(995.27) 04/04/2011  . Pain in limb 2012  . Personal history of fall 2012  . Pulmonary hypertension ([facility]) 07/25/14   Mild-moderate  . Spinal stenosis, unspecified region other than cervical 2002  . Sprain of ankle, left   . Supraventricular premature beats  10/2006  . Tricuspid regurgitation 07/25/14   Mild-moderate  . 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 Urinary tract infection, site not specified   . Urine frequency 01/20/2014  . Urticaria, unspecified 11/01/2008  . Vitamin D deficiency 04/25/2011    Past Surgical History:  Procedure Laterality Date  . BACK SURGERY  1990   HNP L3-4 Dr. 13/02/2014  . CATARACT EXTRACTION W/ INTRAOCULAR LENS IMPLANT Right 05/19/2014   Dr. 04/27/2011  . COLONOSCOPY  1992   acute segmental colitis Dr. Lynnette Caffey  . FACIAL COSMETIC SURGERY  1995   Dr. 07/19/2014  . LARYNGOSCOPY  1987   and biopsy  Dr. Dione Booze    Allergies  Allergen Reactions  . Doxycycline Itching  . Lipitor [Atorvastatin] Other (See Comments)    PAIN IN LEGS  . Pantoprazole Itching  . Pepcid [Famotidine] Itching    Allergies as of 11/12/2018      Reactions   Doxycycline Itching   Lipitor [atorvastatin] Other (See Comments)   PAIN IN LEGS   Pantoprazole Itching   Pepcid [famotidine] Itching      Medication List       Accurate as of November 12, 2018 11:59 PM. If you have any questions, ask your nurse or doctor.        cholecalciferol 1000 units tablet Commonly known as: VITAMIN D Take 1,000 Units by mouth daily.   docusate sodium 100 MG capsule Commonly known as: COLACE Take 100 mg by mouth 3 (three) times daily. 7:30am, 2pm, and  bedtime -to prevent constipation   HYDROcodone-acetaminophen 5-325 MG tablet Commonly known as: NORCO/VICODIN Take 1 tablet by mouth 3 (three) times daily. 7:30am, 2pm, and bedtime. Provided by DR RAMOS office.   Magnesium 250 MG Tabs Take 250 mg by mouth daily at 2 PM.   metoprolol succinate 25 MG 24 hr tablet Commonly known as: TOPROL-XL Take 1 tablet (25 mg total) by mouth daily.   PreserVision AREDS 2 Caps Take 1 capsule by mouth 2 (two) times daily.   rosuvastatin 5 MG tablet Commonly known as: CRESTOR TAKE 1 TABLET DAILY TO LOWER CHOLESTEROL.        Review of Systems:  Review of Systems  Constitutional: Positive for unexpected weight change. Negative for activity change, appetite change, chills, diaphoresis, fatigue and fever.       Gradual wight gain  HENT: Positive for hearing loss. Negative for congestion and voice change.   Respiratory: Positive for wheezing. Negative for cough and shortness of breath.   Cardiovascular: Negative for chest pain, palpitations and leg swelling.  Gastrointestinal: Negative for abdominal distention, abdominal pain, constipation, diarrhea, nausea and vomiting.  Genitourinary: Positive for dysuria and urgency. Negative for difficulty urinating, frequency and hematuria.  Musculoskeletal: Positive for back pain and gait problem.  Skin: Negative for color change and pallor.  Neurological: Negative for dizziness, speech difficulty, weakness and headaches.  Psychiatric/Behavioral: Negative for agitation, behavioral problems, hallucinations and sleep disturbance. The patient is not nervous/anxious.     Health Maintenance  Topic Date Due  . DEXA SCAN  09/02/1991  . PNA vac Low Risk Adult (1 of 2 - PCV13) 03/11/1992  . INFLUENZA VACCINE  10/10/2018  . TETANUS/TDAP  04/24/2021    Physical Exam: Vitals:   11/12/18 1419  BP: 124/80  Pulse: 84  Temp: (!) 97.5 F (36.4 C)  TempSrc: Oral  SpO2: 97%  Weight: 128 lb 12.8 oz (58.4 kg)  Height: 5\' 2"  (1.575 m)   Body mass index is 23.56 kg/m. Physical Exam Constitutional:      General: She is not in acute distress.    Appearance: Normal appearance. She is normal weight. She is not ill-appearing, toxic-appearing or diaphoretic.  HENT:     Head: Normocephalic and atraumatic.     Nose: Nose normal.     Mouth/Throat:     Mouth: Mucous membranes are moist.  Eyes:     Extraocular Movements: Extraocular movements intact.     Conjunctiva/sclera: Conjunctivae normal.     Pupils: Pupils are equal, round, and reactive to light.  Neck:     Musculoskeletal:  Normal range of motion and neck supple.  Cardiovascular:     Rate and Rhythm: Normal rate and regular rhythm.     Heart sounds: No murmur.  Pulmonary:     Breath sounds: Wheezing present. No rhonchi or rales.     Comments: Chronic expiratory wheezes.  Abdominal:     General: Bowel sounds are normal.     Palpations: Abdomen is soft.     Tenderness: There is no abdominal tenderness. There is no right CVA tenderness, left CVA tenderness, guarding or rebound.  Musculoskeletal:        General: Deformity present.     Right lower leg: No edema.     Left lower leg: No edema.     Comments: walker  Skin:    General: Skin is warm and dry.  Neurological:     General: No focal deficit present.     Mental Status: She is alert  and oriented to person, place, and time. Mental status is at baseline.     Motor: No weakness.     Coordination: Coordination normal.     Gait: Gait abnormal.     Deep Tendon Reflexes: Reflexes normal.  Psychiatric:        Mood and Affect: Mood normal.        Behavior: Behavior normal.        Thought Content: Thought content normal.        Judgment: Judgment normal.     Labs reviewed: Basic Metabolic Panel: Recent Labs    11/19/17 0000 11/10/18 0700  NA 138 140  K 4.4 4.5  CL 101 103  CO2 31 31  GLUCOSE 90 87  BUN 16 16  CREATININE 0.97* 0.98*  CALCIUM 9.5 9.4  TSH 0.79 1.52   Liver Function Tests: Recent Labs    11/19/17 0000 11/10/18 0700  AST 19 17  ALT 10 7  BILITOT 0.5 0.4  PROT 6.7 6.6   No results for input(s): LIPASE, AMYLASE in the last 8760 hours. No results for input(s): AMMONIA in the last 8760 hours. CBC: Recent Labs    11/19/17 0000 11/10/18 0700  WBC 6.3 6.4  NEUTROABS  --  2,733  HGB 12.6 12.3  HCT 37.0 37.3  MCV 83.0 89.2  PLT 267 262   Lipid Panel: Recent Labs    11/19/17 0000 11/10/18 0700  CHOL 108 121  HDL 45* 44*  LDLCALC 46 60  TRIG 86 87  CHOLHDL 2.4 2.8   Lab Results  Component Value Date   HGBA1C  5.7 (H) 07/25/2014    Procedures since last visit: No results found.  Assessment/Plan  Chronic constipation Stable, continue Colace 100mg  tid.   Hypertension Blood pressure is controlled, continue Metoprolol 25mg  qd.   Chronic back pain Stable, continue Norco 5/325mg  tid.  Hyperlipidemia LDL goal <70 Controlled, 11/10/18 LDL 60, continue Crestor 5mg  qd.   Expiratory wheezing Chronic, no SOB, cough, chest pain/pressure, palpitation, no O2 desat, afebrile. Observe.   Dysuria Obtain UA C/S.    Labs/tests ordered:  UA C/S  Next appt:  2 weeks [facility] Np, Dr. Lyndel Safe 4 months

## 2018-11-12 NOTE — Assessment & Plan Note (Addendum)
Obtain UA/CS

## 2018-11-12 NOTE — Assessment & Plan Note (Signed)
Stable, continue Norco 5/325mg  tid.

## 2018-11-12 NOTE — Assessment & Plan Note (Signed)
Stable, continue Colace 100mg  tid.

## 2018-11-12 NOTE — Assessment & Plan Note (Signed)
Chronic, no SOB, cough, chest pain/pressure, palpitation, no O2 desat, afebrile. Observe.

## 2018-11-12 NOTE — Assessment & Plan Note (Signed)
Controlled, 11/10/18 LDL 60, continue Crestor 5mg  qd.

## 2018-11-12 NOTE — Patient Instructions (Addendum)
Obtain UA C/S, f/u ManXie NP 2 weeks. F/u Dr. Lyndel Safe in clinic [facility] in 4 months.

## 2018-11-17 ENCOUNTER — Other Ambulatory Visit: Payer: Self-pay | Admitting: Nurse Practitioner

## 2018-11-17 DIAGNOSIS — N390 Urinary tract infection, site not specified: Secondary | ICD-10-CM

## 2018-11-17 MED ORDER — NITROFURANTOIN MONOHYD MACRO 100 MG PO CAPS: 100.0000 mg | ORAL_CAPSULE | Freq: Two times a day (BID) | ORAL | 0 refills | Status: AC

## 2018-11-18 ENCOUNTER — Other Ambulatory Visit: Payer: Self-pay | Admitting: Cardiovascular Disease

## 2018-11-18 DIAGNOSIS — I10 Essential (primary) hypertension: Secondary | ICD-10-CM

## 2018-11-23 ENCOUNTER — Telehealth: Payer: Self-pay | Admitting: *Deleted

## 2018-11-23 NOTE — Telephone Encounter (Signed)
Follow Up:     Pt called back and 11-25-18 appt was changed to a Virtual Visit.

## 2018-11-23 NOTE — Telephone Encounter (Signed)
Left message to call back to change visit to virtual visit this week or move to in office day if patient wants to be seen.

## 2018-11-25 ENCOUNTER — Telehealth (INDEPENDENT_AMBULATORY_CARE_PROVIDER_SITE_OTHER): Payer: Medicare Other | Admitting: Cardiovascular Disease

## 2018-11-25 VITALS — Ht 62.0 in

## 2018-11-25 DIAGNOSIS — E7800 Pure hypercholesterolemia, unspecified: Secondary | ICD-10-CM | POA: Diagnosis not present

## 2018-11-25 DIAGNOSIS — I10 Essential (primary) hypertension: Secondary | ICD-10-CM

## 2018-11-25 DIAGNOSIS — I491 Atrial premature depolarization: Secondary | ICD-10-CM | POA: Diagnosis not present

## 2018-11-25 NOTE — Progress Notes (Signed)
Virtual Visit via Telephone Note   This visit type was conducted due to national recommendations for restrictions regarding the COVID-19 Pandemic (e.g. social distancing) in an effort to limit this patient's exposure and mitigate transmission in our community.  Due to her co-morbid illnesses, this patient is at least at moderate risk for complications without adequate follow up.  This format is felt to be most appropriate for this patient at this time.  The patient did not have access to video technology/had technical difficulties with video requiring transitioning to audio format only (telephone).  All issues noted in this document were discussed and addressed.  No physical exam could be performed with this format.  Please refer to the patient's chart for her  consent to telehealth for [facility].   Date:  11/25/2018   ID:  [name], DOB [birth year], MRN [id number]  Patient Location: Home Provider Location: Home  PCP:  Mast, Man X, NP  Cardiologist:  Skeet Latch, MD  Electrophysiologist:  None   Evaluation Performed:  Follow-Up Visit  Chief Complaint:  Follow up  History of Present Illness:    [name] is a 85 y.o. female with hypertension, hyperlipidemia, recurrent pleural effusions, PACs and PVCs who presents for follow up on arrhythmia.  [name] was initially evaluated on 05/2015 after being seen in the ED with a complaint of palpitations and pre-syncope.  In the ED telemetry showed sinus rhythm and sinus tachycardia up to the 120s.  Cardiac enzymes were negative and echo revealed LVEF 55-60% and grade 1 diastolic dysfunction.  TSH was normal. By the time she followed up in our office she denied recurrent symptoms.  Amlodipine was discontinued and she was started on metoprolol.  She followed up 08/2015 and was doing well.  [name] lives at [facility] and has many friends there.    At her last appointment aspirin was stopped.  Since her appointment she  has been doing well.  She had a UTI last week that has been treated.  She is otherwise feeling well.  She has no chest pain or shortness of breath.  She hasn't been active lately due to her UTI and coronaviurs.  She hopes to start walking more soon.  She has no exertional chest pain or shortness of breath.  She denies edema, orthopnea or PND.  She hasn't had any recurrent palpitations.  She struggles with feeling isolated due to coronavirus.  The residents have very limited interactions.  The patient does not have symptoms concerning for COVID-19 infection (fever, chills, cough, or new shortness of breath).    Past Medical History:  Diagnosis Date  . Acute upper respiratory infections of unspecified site 2013  . Cellulitis and abscess of hand, except fingers and thumb 10/04/2010  . Degeneration of intervertebral disc, site unspecified 2000  . Diaphragmatic hernia without mention of obstruction or gangrene 08/15/1998  . Diverticulosis of colon (without mention of hemorrhage) 2000  . Hearing loss 09/28/2015  . Hyperlipidemia 2007  . Hypertension 2007  . Impacted cerumen   . Loss of weight 04/25/2011  . Myalgia and myositis, unspecified 01/07/2007  . Nontoxic uninodular goiter 08/15/1998  . Osteoarthrosis, unspecified whether generalized or localized, unspecified site 07/22/2000  . Osteoporosis 2000  . Other drug allergy(995.27) 04/04/2011  . Pain in limb 2012  . Personal history of fall 2012  . Pulmonary hypertension ([place]) 07/25/14   Mild-moderate  . Spinal stenosis, unspecified region other than cervical 2002  . Sprain of  ankle, left   . Supraventricular premature beats 10/2006  . Tricuspid regurgitation 07/25/14   Mild-moderate  . 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 Urinary tract infection, site not specified   . Urine frequency 01/20/2014  . Urticaria, unspecified 11/01/2008  . Vitamin D deficiency 04/25/2011   Past Surgical History:  Procedure Laterality Date   . BACK SURGERY  1990   HNP L3-4 Dr. Lynnette Caffey  . CATARACT EXTRACTION W/ INTRAOCULAR LENS IMPLANT Right 05/19/2014   Dr. Dione Booze  . COLONOSCOPY  1992   acute segmental colitis Dr. Russella Dar  . FACIAL COSMETIC SURGERY  1995   Dr. Charolotte Eke  . LARYNGOSCOPY  1987   and biopsy  Dr. Haroldine Laws     Current Meds  Medication Sig  . cholecalciferol (VITAMIN D) 1000 UNITS tablet Take 1,000 Units by mouth daily.   Marland Kitchen docusate sodium (COLACE) 100 MG capsule Take 100 mg by mouth 3 (three) times daily. 7:30am, 2pm, and bedtime -to prevent constipation  . HYDROcodone-acetaminophen (NORCO/VICODIN) 5-325 MG per tablet Take 1 tablet by mouth 3 (three) times daily. 7:30am, 2pm, and bedtime. Provided by DR RAMOS office.   . Magnesium 250 MG TABS Take 250 mg by mouth daily at 2 PM.   . metoprolol succinate (TOPROL-XL) 25 MG 24 hr tablet Take 1 tablet (25 mg total) by mouth daily. NEED OV.  . Multiple Vitamins-Minerals (PRESERVISION AREDS 2) CAPS Take 1 capsule by mouth daily.   . rosuvastatin (CRESTOR) 5 MG tablet TAKE 1 TABLET DAILY TO LOWER CHOLESTEROL.     Allergies:   Doxycycline, Lipitor [atorvastatin], Pantoprazole, and Pepcid [famotidine]   Social History   Tobacco Use  . Smoking status: Never Smoker  . Smokeless tobacco: Never Used  Substance Use Topics  . Alcohol use: No  . Drug use: No     Family Hx: The patient's family history includes Hypertension in her brother; Pancreatic cancer in her sister; Rheum arthritis in her maternal grandmother; Stroke in her father and mother.  ROS:   Please see the history of present illness.     All other systems reviewed and are negative.   Prior CV studies:   The following studies were reviewed today:  Echo 05/01/15: Study Conclusions  - Left ventricle: The cavity size was normal. Systolic function was  normal. The estimated ejection fraction was in the range of 55%  to 60%. Wall motion was normal; there were no regional wall  motion  abnormalities. There was an increased relative  contribution of atrial contraction to ventricular filling.  Doppler parameters are consistent with abnormal left ventricular  relaxation (grade 1 diastolic dysfunction). - Aortic valve: Trileaflet; mildly thickened, mildly calcified  leaflets. - Mitral valve: Calcified annulus. There was mild regurgitation. - Tricuspid valve: There was mild regurgitation.   Labs/Other Tests and Data Reviewed:    EKG:  No ECG reviewed.  Recent Labs: 11/10/2018: ALT 7; BUN 16; Creat 0.98; Hemoglobin 12.3; Platelets 262; Potassium 4.5; Sodium 140; TSH 1.52   Recent Lipid Panel Lab Results  Component Value Date/Time   CHOL 121 11/10/2018 07:00 AM   TRIG 87 11/10/2018 07:00 AM   HDL 44 (L) 11/10/2018 07:00 AM   CHOLHDL 2.8 11/10/2018 07:00 AM   LDLCALC 60 11/10/2018 07:00 AM    Wt Readings from Last 3 Encounters:  11/12/18 128 lb 12.8 oz (58.4 kg)  05/21/18 126 lb 9.6 oz (57.4 kg)  11/18/17 121 lb 6.4 oz (55.1 kg)     Objective:  Vital Signs:  Ht 5\' 2"  (1.575 m)   BMI 23.56 kg/m    VITAL SIGNS:  reviewed GEN:  no acute distress RESPIRATORY:  respirations unlabored NEURO:  alert and oriented x 3, no obvious focal deficit PSYCH:  normal affect  ASSESSMENT & PLAN:    # Tachycardia, near syncope: No recurrent symptoms. She is asymptomatic on metoprolol.  # Hypertension: Blood pressure has been well-controlled at her clinic appointments.  She doesn't check it regularly.  Continue metoprolol.  # Hyperlipidemia: LDL was 60 on 11/2018.  Continue Rosuvastatin. Lipids managed with PCP.  COVID-19 Education: The signs and symptoms of COVID-19 were discussed with the patient and how to seek care for testing (follow up with PCP or arrange E-visit).  The importance of social distancing was discussed today.  Time:   Today, I have spent 16 minutes with the patient with telehealth technology discussing the above problems.     Medication  Adjustments/Labs and Tests Ordered: Current medicines are reviewed at length with the patient today.  Concerns regarding medicines are outlined above.   Tests Ordered: No orders of the defined types were placed in this encounter.   Medication Changes: No orders of the defined types were placed in this encounter.   Follow Up: as needed  Signed, 12/2018, MD  11/25/2018 9:44 AM    [facility]

## 2018-11-25 NOTE — Patient Instructions (Signed)
Medication Instructions:  Your physician recommends that you continue on your current medications as directed. Please refer to the Current Medication list given to you today.  If you need a refill on your cardiac medications before your next appointment, please call your pharmacy.   Lab work: NONE  Testing/Procedures: NONE  Follow-Up: AS NEEDED   

## 2018-11-26 ENCOUNTER — Other Ambulatory Visit: Payer: Self-pay

## 2018-11-26 ENCOUNTER — Non-Acute Institutional Stay: Payer: Medicare Other | Admitting: Nurse Practitioner

## 2018-11-26 ENCOUNTER — Encounter: Payer: Self-pay | Admitting: Nurse Practitioner

## 2018-11-26 DIAGNOSIS — N390 Urinary tract infection, site not specified: Secondary | ICD-10-CM | POA: Diagnosis not present

## 2018-11-26 DIAGNOSIS — M545 Low back pain: Secondary | ICD-10-CM

## 2018-11-26 DIAGNOSIS — G8929 Other chronic pain: Secondary | ICD-10-CM | POA: Diagnosis not present

## 2018-11-26 DIAGNOSIS — I10 Essential (primary) hypertension: Secondary | ICD-10-CM | POA: Diagnosis not present

## 2018-11-26 NOTE — Progress Notes (Signed)
Location:   clinic [facility]   Place of Service:  Clinic (12) Provider: Chipper OmanManXie Bladyn Tipps NP  Code Status: DNR Goals of Care: [place] 11/12/2018  Does Patient Have a Medical Advance Directive? Yes  Type of Estate agentAdvance Directive Healthcare Power of [place];Living will  Does patient want to make changes to medical advance directive? No - Guardian declined  Copy of Healthcare Power of Attorney in Chart? -     Chief Complaint  Patient presents with  . Medical Management of Chronic Issues    2 wk f/u    HPI: Patient is a 85 y.o. female seen today for f/u UTI, empirical Nitrofurantoin 100mg  bid initially, symptoms are much improved after first 3 does, then  completed total 7 day course of Nitrofurantoin.   Hx of HTN, blood pressure is controlled, on Metoprolol 25mg  qd. Back pain, chronic, managed with Norco 5/325mg  tid.    Past Medical History:  Diagnosis Date  . Acute upper respiratory infections of unspecified site 2013  . Cellulitis and abscess of hand, except fingers and thumb 10/04/2010  . Degeneration of intervertebral disc, site unspecified 2000  . Diaphragmatic hernia without mention of obstruction or gangrene 08/15/1998  . Diverticulosis of colon (without mention of hemorrhage) 2000  . Hearing loss 09/28/2015  . Hyperlipidemia 2007  . Hypertension 2007  . Impacted cerumen   . Loss of weight 04/25/2011  . Myalgia and myositis, unspecified 01/07/2007  . Nontoxic uninodular goiter 08/15/1998  . Osteoarthrosis, unspecified whether generalized or localized, unspecified site 07/22/2000  . Osteoporosis 2000  . Other drug allergy(995.27) 04/04/2011  . Pain in limb 2012  . Personal history of fall 2012  . Pulmonary hypertension ([facility]) 07/25/14   Mild-moderate  . Spinal stenosis, unspecified region other than cervical 2002  . Sprain of ankle, left   . Supraventricular premature beats 10/2006  . Tricuspid regurgitation 07/25/14   Mild-moderate  . Unspecified late effects of  cerebrovascular disease 01/01/2006  . Urinary tract infection, site not specified   . Urine frequency 01/20/2014  . Urticaria, unspecified 11/01/2008  . Vitamin D deficiency 04/25/2011    Past Surgical History:  Procedure Laterality Date  . BACK SURGERY  1990   HNP L3-4 Dr. Lynnette CaffeyBlomquist  . CATARACT EXTRACTION W/ INTRAOCULAR LENS IMPLANT Right 05/19/2014   Dr. Dione BoozeGroat  . COLONOSCOPY  1992   acute segmental colitis Dr. Russella DarStark  . FACIAL COSMETIC SURGERY  1995   Dr. Charolotte EkePatseavouras  . LARYNGOSCOPY  1987   and biopsy  Dr. Haroldine Lawsrossley    Allergies  Allergen Reactions  . Doxycycline Itching  . Lipitor [Atorvastatin] Other (See Comments)    PAIN IN LEGS  . Pantoprazole Itching  . Pepcid [Famotidine] Itching    Allergies as of 11/26/2018      Reactions   Doxycycline Itching   Lipitor [atorvastatin] Other (See Comments)   PAIN IN LEGS   Pantoprazole Itching   Pepcid [famotidine] Itching      Medication List       Accurate as of November 26, 2018 11:59 PM. If you have any questions, ask your nurse or doctor.        cholecalciferol 1000 units tablet Commonly known as: VITAMIN D Take 1,000 Units by mouth daily.   docusate sodium 100 MG capsule Commonly known as: COLACE Take 100 mg by mouth 3 (three) times daily. 7:30am, 2pm, and bedtime -to prevent constipation   HYDROcodone-acetaminophen 5-325 MG tablet Commonly known as: NORCO/VICODIN Take 1 tablet by mouth 3 (three)  times daily. 7:30am, 2pm, and bedtime. Provided by DR RAMOS office.   Magnesium 250 MG Tabs Take 250 mg by mouth daily at 2 PM.   metoprolol succinate 25 MG 24 hr tablet Commonly known as: TOPROL-XL Take 1 tablet (25 mg total) by mouth daily. NEED OV.   PreserVision AREDS 2 Caps Take 1 capsule by mouth daily.   rosuvastatin 5 MG tablet Commonly known as: CRESTOR TAKE 1 TABLET DAILY TO LOWER CHOLESTEROL.       Review of Systems:  Review of Systems  Constitutional: Negative for activity change, appetite  change, chills, diaphoresis, fatigue and fever.  HENT: Positive for hearing loss. Negative for congestion and voice change.   Respiratory: Negative for cough, shortness of breath and wheezing.   Cardiovascular: Negative for chest pain, palpitations and leg swelling.  Gastrointestinal: Negative for abdominal distention, abdominal pain, constipation, diarrhea, nausea and vomiting.  Genitourinary: Positive for hematuria. Negative for difficulty urinating, dysuria and urgency.  Musculoskeletal: Positive for back pain and gait problem.  Skin: Negative for color change and pallor.  Neurological: Negative for dizziness, speech difficulty, weakness and headaches.  Psychiatric/Behavioral: Negative for agitation, behavioral problems, hallucinations and sleep disturbance. The patient is not nervous/anxious.     Health Maintenance  Topic Date Due  . DEXA SCAN  09/02/1991  . PNA vac Low Risk Adult (1 of 2 - PCV13) 03/11/1992  . INFLUENZA VACCINE  10/10/2018  . TETANUS/TDAP  04/24/2021    Physical Exam: Vitals:   11/26/18 1647  BP: 128/62  Pulse: 72  Resp: 18  Temp: (!) 95.9 F (35.5 C)  SpO2: 95%  Weight: 128 lb 6.4 oz (58.2 kg)  Height: 5\' 2"  (1.575 m)   Body mass index is 23.48 kg/m. Physical Exam Vitals signs and nursing note reviewed.  Constitutional:      General: She is not in acute distress.    Appearance: Normal appearance. She is not ill-appearing, toxic-appearing or diaphoretic.  HENT:     Head: Normocephalic and atraumatic.     Nose: Nose normal.     Mouth/Throat:     Mouth: Mucous membranes are moist.  Eyes:     Extraocular Movements: Extraocular movements intact.     Conjunctiva/sclera: Conjunctivae normal.     Pupils: Pupils are equal, round, and reactive to light.  Neck:     Musculoskeletal: Normal range of motion and neck supple.  Cardiovascular:     Rate and Rhythm: Normal rate and regular rhythm.     Heart sounds: No murmur.  Pulmonary:     Breath sounds: No  wheezing, rhonchi or rales.  Abdominal:     General: Bowel sounds are normal. There is no distension.     Palpations: Abdomen is soft.     Tenderness: There is no abdominal tenderness. There is no right CVA tenderness, left CVA tenderness, guarding or rebound.  Musculoskeletal:     Right lower leg: No edema.     Left lower leg: No edema.     Comments: Ambulates with walker.   Skin:    General: Skin is warm and dry.  Neurological:     General: No focal deficit present.     Mental Status: She is alert and oriented to person, place, and time. Mental status is at baseline.     Cranial Nerves: No cranial nerve deficit.     Motor: No weakness.     Coordination: Coordination normal.     Gait: Gait abnormal.  Psychiatric:  Mood and Affect: Mood normal.        Behavior: Behavior normal.        Thought Content: Thought content normal.        Judgment: Judgment normal.     Labs reviewed: Basic Metabolic Panel: Recent Labs    11/10/18 0700  NA 140  K 4.5  CL 103  CO2 31  GLUCOSE 87  BUN 16  CREATININE 0.98*  CALCIUM 9.4  TSH 1.52   Liver Function Tests: Recent Labs    11/10/18 0700  AST 17  ALT 7  BILITOT 0.4  PROT 6.6   No results for input(s): LIPASE, AMYLASE in the last 8760 hours. No results for input(s): AMMONIA in the last 8760 hours. CBC: Recent Labs    11/10/18 0700  WBC 6.4  NEUTROABS 2,733  HGB 12.3  HCT 37.3  MCV 89.2  PLT 262   Lipid Panel: Recent Labs    11/10/18 0700  CHOL 121  HDL 44*  LDLCALC 60  TRIG 87  CHOLHDL 2.8   Lab Results  Component Value Date   HGBA1C 5.7 (H) 07/25/2014    Procedures since last visit: No results found.  Assessment/Plan  UTI (urinary tract infection) Empirically treated with Nitrofurantoin 100mg  bid initially, symptoms are much improved after the first 3 doses, then completed total 7 day course of Nitrofurantoin. Urine culture showed not susceptible, but clinically resolved symptoms. Will f/u UA  C/S, observe.     Hypertension Blood pressure is controlled, continue Metoprolol 25mg  qd.   Chronic back pain No recent changes, continue Norco 5/325mg  tid.    Labs/tests ordered:  F/u UA C/S  Next appt: 6 months

## 2018-11-26 NOTE — Assessment & Plan Note (Signed)
No recent changes, continue Norco 5/325mg  tid.

## 2018-11-26 NOTE — Assessment & Plan Note (Signed)
Blood pressure is controlled, continue Metoprolol 25mg qd.  

## 2018-11-26 NOTE — Assessment & Plan Note (Signed)
Empirically treated with Nitrofurantoin 100mg  bid initially, symptoms are much improved after the first 3 doses, then completed total 7 day course of Nitrofurantoin. Urine culture showed not susceptible, but clinically resolved symptoms. Will f/u UA C/S, observe.

## 2018-11-26 NOTE — Patient Instructions (Addendum)
F/u UA C/S, f/u in clinic [facility] in 6 months.

## 2018-11-27 ENCOUNTER — Other Ambulatory Visit: Payer: Self-pay | Admitting: Nurse Practitioner

## 2018-11-27 ENCOUNTER — Encounter: Payer: Self-pay | Admitting: Nurse Practitioner

## 2018-11-27 DIAGNOSIS — E785 Hyperlipidemia, unspecified: Secondary | ICD-10-CM

## 2018-11-27 DIAGNOSIS — N390 Urinary tract infection, site not specified: Secondary | ICD-10-CM

## 2018-12-02 ENCOUNTER — Other Ambulatory Visit: Payer: Self-pay | Admitting: *Deleted

## 2018-12-02 DIAGNOSIS — N390 Urinary tract infection, site not specified: Secondary | ICD-10-CM

## 2018-12-03 ENCOUNTER — Other Ambulatory Visit: Payer: Self-pay

## 2018-12-03 DIAGNOSIS — N390 Urinary tract infection, site not specified: Secondary | ICD-10-CM

## 2018-12-04 ENCOUNTER — Telehealth: Payer: Self-pay | Admitting: *Deleted

## 2018-12-04 LAB — URINE CULTURE
MICRO NUMBER:: 918059
Result:: NO GROWTH
SPECIMEN QUALITY:: ADEQUATE

## 2018-12-04 LAB — URINALYSIS
Bilirubin Urine: NEGATIVE
Glucose, UA: NEGATIVE
Hgb urine dipstick: NEGATIVE
Ketones, ur: NEGATIVE
Nitrite: NEGATIVE
Protein, ur: NEGATIVE
Specific Gravity, Urine: 1.009 (ref 1.001–1.03)
pH: 8 (ref 5.0–8.0)

## 2018-12-04 LAB — EXTRA URINE SPECIMEN

## 2018-12-04 NOTE — Telephone Encounter (Signed)
Patient called regarding her Prevnar 13 vaccine, she want to get this as soon  as possible because she stated that she had pneumonia one time before. Patient is unable to go to the pharmacy to get this vaccine because her son's don't live here [facility] [place]) Please advise!

## 2018-12-10 ENCOUNTER — Other Ambulatory Visit: Payer: Self-pay | Admitting: *Deleted

## 2018-12-10 DIAGNOSIS — Z23 Encounter for immunization: Secondary | ICD-10-CM

## 2018-12-10 NOTE — Telephone Encounter (Signed)
Spoke with patient regarding her Prevnar 13 vaccine, she stated that she will go to [facility] to get it done. This is order was given by [facility] Mast NP.

## 2019-02-15 ENCOUNTER — Other Ambulatory Visit: Payer: Self-pay | Admitting: Cardiovascular Disease

## 2019-02-15 DIAGNOSIS — I10 Essential (primary) hypertension: Secondary | ICD-10-CM

## 2019-02-24 ENCOUNTER — Encounter (INDEPENDENT_AMBULATORY_CARE_PROVIDER_SITE_OTHER): Payer: Medicare Other | Admitting: Ophthalmology

## 2019-03-08 ENCOUNTER — Other Ambulatory Visit: Payer: Self-pay | Admitting: Nurse Practitioner

## 2019-03-08 DIAGNOSIS — E785 Hyperlipidemia, unspecified: Secondary | ICD-10-CM

## 2019-03-08 NOTE — Telephone Encounter (Signed)
rx sent to pharmacy by e-script  

## 2019-03-24 ENCOUNTER — Encounter (INDEPENDENT_AMBULATORY_CARE_PROVIDER_SITE_OTHER): Payer: Medicare Other | Admitting: Ophthalmology

## 2019-03-24 DIAGNOSIS — H353132 Nonexudative age-related macular degeneration, bilateral, intermediate dry stage: Secondary | ICD-10-CM

## 2019-03-24 DIAGNOSIS — H35033 Hypertensive retinopathy, bilateral: Secondary | ICD-10-CM | POA: Diagnosis not present

## 2019-03-24 DIAGNOSIS — I10 Essential (primary) hypertension: Secondary | ICD-10-CM | POA: Diagnosis not present

## 2019-03-24 DIAGNOSIS — H43813 Vitreous degeneration, bilateral: Secondary | ICD-10-CM

## 2019-04-21 ENCOUNTER — Encounter (INDEPENDENT_AMBULATORY_CARE_PROVIDER_SITE_OTHER): Payer: Medicare Other | Admitting: Ophthalmology

## 2019-04-21 DIAGNOSIS — H353132 Nonexudative age-related macular degeneration, bilateral, intermediate dry stage: Secondary | ICD-10-CM

## 2019-04-21 DIAGNOSIS — H43813 Vitreous degeneration, bilateral: Secondary | ICD-10-CM

## 2019-04-21 DIAGNOSIS — I10 Essential (primary) hypertension: Secondary | ICD-10-CM

## 2019-04-21 DIAGNOSIS — H35033 Hypertensive retinopathy, bilateral: Secondary | ICD-10-CM | POA: Diagnosis not present

## 2019-05-19 ENCOUNTER — Encounter (INDEPENDENT_AMBULATORY_CARE_PROVIDER_SITE_OTHER): Payer: Medicare Other | Admitting: Ophthalmology

## 2019-05-19 ENCOUNTER — Other Ambulatory Visit: Payer: Self-pay

## 2019-05-19 DIAGNOSIS — H353221 Exudative age-related macular degeneration, left eye, with active choroidal neovascularization: Secondary | ICD-10-CM | POA: Diagnosis not present

## 2019-05-19 DIAGNOSIS — H43813 Vitreous degeneration, bilateral: Secondary | ICD-10-CM

## 2019-05-19 DIAGNOSIS — H33303 Unspecified retinal break, bilateral: Secondary | ICD-10-CM

## 2019-05-19 DIAGNOSIS — H353112 Nonexudative age-related macular degeneration, right eye, intermediate dry stage: Secondary | ICD-10-CM

## 2019-05-19 DIAGNOSIS — I10 Essential (primary) hypertension: Secondary | ICD-10-CM

## 2019-05-19 DIAGNOSIS — H35033 Hypertensive retinopathy, bilateral: Secondary | ICD-10-CM

## 2019-05-27 ENCOUNTER — Other Ambulatory Visit: Payer: Self-pay

## 2019-05-27 ENCOUNTER — Non-Acute Institutional Stay: Payer: Medicare Other | Admitting: Nurse Practitioner

## 2019-05-27 ENCOUNTER — Encounter: Payer: Self-pay | Admitting: Nurse Practitioner

## 2019-05-27 DIAGNOSIS — I10 Essential (primary) hypertension: Secondary | ICD-10-CM | POA: Diagnosis not present

## 2019-05-27 DIAGNOSIS — G8929 Other chronic pain: Secondary | ICD-10-CM

## 2019-05-27 DIAGNOSIS — D509 Iron deficiency anemia, unspecified: Secondary | ICD-10-CM

## 2019-05-27 DIAGNOSIS — M545 Low back pain: Secondary | ICD-10-CM | POA: Diagnosis not present

## 2019-05-27 DIAGNOSIS — R55 Syncope and collapse: Secondary | ICD-10-CM

## 2019-05-27 DIAGNOSIS — E785 Hyperlipidemia, unspecified: Secondary | ICD-10-CM | POA: Diagnosis not present

## 2019-05-27 NOTE — Assessment & Plan Note (Signed)
LDL at goal 11/2018, continue Crestor, update lipid panel.

## 2019-05-27 NOTE — Assessment & Plan Note (Deleted)
Blood pressure is controlled, continue Metoprolol, update CMP/eGFR

## 2019-05-27 NOTE — Patient Instructions (Addendum)
CBC/diff, CMP/eGFR, Lipid panel prior to 6 months with Dr Lyndel Safe. [facility] has regular home visit.

## 2019-05-27 NOTE — Assessment & Plan Note (Signed)
Blood pressure is controlled, continue Metoprolol, update CMP/eGFR

## 2019-05-27 NOTE — Progress Notes (Signed)
Location:   clinic [place]   Place of Service:  Clinic (12) Provider: Marlana Latus NP  Code Status: DNR Goals of Care: [place] 05/27/2019  Does Patient Have a Medical Advance Directive? Yes  Type of Advance Directive Living will;Healthcare Power of Attorney  Does patient want to make changes to medical advance directive? No - Patient declined  Copy of [place] in Chart? Yes - validated most recent copy scanned in chart (See row information)     Chief Complaint  Patient presents with  . Medical Management of Chronic Issues    6 month follow up  . Health Maintenance    Dexa scan    HPI: Patient is a 86 y.o. female seen today for medical management of chronic diseases.    The patient has history of HTN, blood pressure is controlled on Metoprolol, Hyperlipidemia, stable on Crestor 71m qd, OA pain, stable, on Norco [id number]/[id number].   Past Medical History:  Diagnosis Date  . Acute upper respiratory infections of unspecified site 2013  . Cellulitis and abscess of hand, except fingers and thumb 10/04/2010  . Degeneration of intervertebral disc, site unspecified 2000  . Diaphragmatic hernia without mention of obstruction or gangrene 08/15/1998  . Diverticulosis of colon (without mention of hemorrhage) 2000  . Hearing loss 09/28/2015  . Hyperlipidemia 2007  . Hypertension 2007  . Impacted cerumen   . Loss of weight 04/25/2011  . Myalgia and myositis, unspecified 01/07/2007  . Nontoxic uninodular goiter 08/15/1998  . Osteoarthrosis, unspecified whether generalized or localized, unspecified site 07/22/2000  . Osteoporosis 2000  . Other drug allergy(995.27) 04/04/2011  . Pain in limb 2012  . Personal history of fall 2012  . Pulmonary hypertension ([place]5/16/16   Mild-moderate  . Spinal stenosis, unspecified region other than cervical 2002  . Sprain of ankle, left   . Supraventricular premature beats 10/2006  . Tricuspid regurgitation 07/25/14   Mild-moderate   . 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 Urinary tract infection, site not specified   . Urine frequency 01/20/2014  . Urticaria, unspecified 11/01/2008  . Vitamin D deficiency 04/25/2011    Past Surgical History:  Procedure Laterality Date  . BACK SURGERY  1990   HNP L3-4 Dr. BlCheri Rous. CATARACT EXTRACTION W/ INTRAOCULAR LENS IMPLANT Right 05/19/2014   Dr. GrKaty Fitch. COLONOSCOPY  1992   acute segmental colitis Dr. StFuller Plan. FACIAL COSMETIC SURGERY  1995   Dr. PaJeanella Flattery. LARYNGOSCOPY  1987   and biopsy  Dr. CrErnesto Rutherford  Allergies  Allergen Reactions  . Doxycycline Itching  . Lipitor [Atorvastatin] Other (See Comments)    PAIN IN LEGS  . Pantoprazole Itching  . Pepcid [Famotidine] Itching    Allergies as of 05/27/2019      Reactions   Doxycycline Itching   Lipitor [atorvastatin] Other (See Comments)   PAIN IN LEGS   Pantoprazole Itching   Pepcid [famotidine] Itching      Medication List       Accurate as of May 27, 2019 11:59 PM. If you have any questions, ask your nurse or doctor.        cholecalciferol 1000 units tablet Commonly known as: VITAMIN D Take 1,000 Units by mouth daily.   docusate sodium 100 MG capsule Commonly known as: COLACE Take 100 mg by mouth 3 (three) times daily. 7:30am, 2pm, and bedtime -to prevent constipation   HYDROcodone-acetaminophen 5-325 MG tablet Commonly known as:  NORCO/VICODIN Take 1 tablet by mouth 3 (three) times daily. 7:30am, 2pm, and bedtime. Provided by DR RAMOS office.   Magnesium 250 MG Tabs Take 250 mg by mouth daily at 2 PM.   metoprolol succinate 25 MG 24 hr tablet Commonly known as: TOPROL-XL TAKE 1 TABLET ONCE DAILY.   PreserVision AREDS 2 Caps Take 1 capsule by mouth daily.   rosuvastatin 5 MG tablet Commonly known as: CRESTOR TAKE 1 TABLET DAILY TO LOWER CHOLESTEROL.       Review of Systems:  Review of Systems  Constitutional: Negative for activity change, appetite  change, fatigue, fever and unexpected weight change.  HENT: Positive for hearing loss. Negative for congestion and voice change.   Eyes: Positive for visual disturbance.  Respiratory: Negative for cough, shortness of breath and wheezing.   Cardiovascular: Negative for chest pain, palpitations and leg swelling.  Gastrointestinal: Negative for abdominal distention, abdominal pain, constipation, nausea and vomiting.  Genitourinary: Negative for difficulty urinating, dysuria, hematuria and urgency.       2x/night  Musculoskeletal: Positive for back pain and gait problem.       Lower back pain, travels to legs sometimes, f/u Ortho.   Skin: Negative for color change.  Neurological: Negative for dizziness, speech difficulty, weakness and headaches.  Psychiatric/Behavioral: Negative for agitation, behavioral problems and sleep disturbance. The patient is not nervous/anxious.     Health Maintenance  Topic Date Due  . DEXA SCAN  Never done  . PNA vac Low Risk Adult (2 of 2 - PPSV23) 12/10/2019  . TETANUS/TDAP  04/24/2021  . INFLUENZA VACCINE  Completed    Physical Exam: Vitals:   05/27/19 1327  BP: 126/70  Pulse: 93  Temp: (!) 97.1 F (36.2 C)  SpO2: 97%  Weight: 129 lb (58.5 kg)  Height: 5' 2"  (1.575 m)   Body mass index is 23.59 kg/m. Physical Exam Vitals and nursing note reviewed.  Constitutional:      General: She is not in acute distress.    Appearance: Normal appearance. She is toxic-appearing. She is not ill-appearing.  HENT:     Head: Normocephalic and atraumatic.     Nose: Nose normal.     Mouth/Throat:     Mouth: Mucous membranes are moist.  Eyes:     Extraocular Movements: Extraocular movements intact.     Conjunctiva/sclera: Conjunctivae normal.     Pupils: Pupils are equal, round, and reactive to light.  Cardiovascular:     Rate and Rhythm: Normal rate and regular rhythm.     Heart sounds: No murmur.  Pulmonary:     Breath sounds: No wheezing or rales.   Abdominal:     General: Bowel sounds are normal. There is no distension.     Palpations: Abdomen is soft.     Tenderness: There is no abdominal tenderness. There is no guarding or rebound.  Musculoskeletal:     Cervical back: Normal range of motion and neck supple.     Right lower leg: No edema.     Left lower leg: No edema.     Comments: Ambulates with walker.   Skin:    General: Skin is warm and dry.  Neurological:     General: No focal deficit present.     Mental Status: She is alert and oriented to person, place, and time. Mental status is at baseline.     Motor: No weakness.     Coordination: Coordination normal.     Gait: Gait abnormal.  Psychiatric:  Mood and Affect: Mood normal.        Behavior: Behavior normal.        Thought Content: Thought content normal.        Judgment: Judgment normal.     Labs reviewed: Basic Metabolic Panel: Recent Labs    11/10/18 0700  NA 140  K 4.5  CL 103  CO2 31  GLUCOSE 87  BUN 16  CREATININE 0.98*  CALCIUM 9.4  TSH 1.52   Liver Function Tests: Recent Labs    11/10/18 0700  AST 17  ALT 7  BILITOT 0.4  PROT 6.6   No results for input(s): LIPASE, AMYLASE in the last 8760 hours. No results for input(s): AMMONIA in the last 8760 hours. CBC: Recent Labs    11/10/18 0700  WBC 6.4  NEUTROABS 2,733  HGB 12.3  HCT 37.3  MCV 89.2  PLT 262   Lipid Panel: Recent Labs    11/10/18 0700  CHOL 121  HDL 44*  LDLCALC 60  TRIG 87  CHOLHDL 2.8   Lab Results  Component Value Date   HGBA1C 5.7 (H) 07/25/2014    Procedures since last visit: No results found.  Assessment/Plan  Hypertension Blood pressure is controlled, continue Metoprolol, update CMP/eGFR  Chronic back pain Stable, continue Norco, declined DEXA   Hyperlipidemia LDL goal <70 LDL at goal 11/2018, continue Crestor, update lipid panel.   Iron deficiency anemia Hgb 12.3 11/2018, update CBC/diff   Labs/tests ordered:  CBC/diff, CMP/eGFR,  Lipid panel.   Next appt:  6 months with Dr Lyndel Safe.

## 2019-05-27 NOTE — Assessment & Plan Note (Addendum)
Stable, continue Norco, declined DEXA

## 2019-05-27 NOTE — Assessment & Plan Note (Addendum)
Hgb 12.3 11/2018, update CBC/diff

## 2019-05-28 ENCOUNTER — Encounter: Payer: Self-pay | Admitting: Nurse Practitioner

## 2019-06-04 ENCOUNTER — Other Ambulatory Visit: Payer: Self-pay | Admitting: Nurse Practitioner

## 2019-06-04 DIAGNOSIS — E785 Hyperlipidemia, unspecified: Secondary | ICD-10-CM

## 2019-06-18 ENCOUNTER — Encounter (INDEPENDENT_AMBULATORY_CARE_PROVIDER_SITE_OTHER): Payer: Medicare Other | Admitting: Ophthalmology

## 2019-06-18 DIAGNOSIS — I10 Essential (primary) hypertension: Secondary | ICD-10-CM

## 2019-06-18 DIAGNOSIS — H3322 Serous retinal detachment, left eye: Secondary | ICD-10-CM

## 2019-06-18 DIAGNOSIS — H35033 Hypertensive retinopathy, bilateral: Secondary | ICD-10-CM

## 2019-06-18 DIAGNOSIS — H353221 Exudative age-related macular degeneration, left eye, with active choroidal neovascularization: Secondary | ICD-10-CM

## 2019-06-18 DIAGNOSIS — H43813 Vitreous degeneration, bilateral: Secondary | ICD-10-CM

## 2019-06-18 DIAGNOSIS — H353112 Nonexudative age-related macular degeneration, right eye, intermediate dry stage: Secondary | ICD-10-CM | POA: Diagnosis not present

## 2019-08-18 ENCOUNTER — Encounter (INDEPENDENT_AMBULATORY_CARE_PROVIDER_SITE_OTHER): Payer: Medicare Other | Admitting: Ophthalmology

## 2019-09-08 ENCOUNTER — Other Ambulatory Visit: Payer: Self-pay | Admitting: Internal Medicine

## 2019-09-08 DIAGNOSIS — E785 Hyperlipidemia, unspecified: Secondary | ICD-10-CM

## 2019-09-08 NOTE — Telephone Encounter (Signed)
rx sent to pharmacy by e-script  

## 2019-11-09 ENCOUNTER — Other Ambulatory Visit: Payer: Self-pay

## 2019-11-09 DIAGNOSIS — I10 Essential (primary) hypertension: Secondary | ICD-10-CM

## 2019-11-09 DIAGNOSIS — E785 Hyperlipidemia, unspecified: Secondary | ICD-10-CM

## 2019-11-09 DIAGNOSIS — D509 Iron deficiency anemia, unspecified: Secondary | ICD-10-CM

## 2019-11-09 LAB — CBC WITH DIFFERENTIAL/PLATELET
Absolute Monocytes: 519 cells/uL (ref 200–950)
Basophils Absolute: 49 cells/uL (ref 0–200)
Basophils Relative: 0.8 %
Eosinophils Absolute: 220 cells/uL (ref 15–500)
Eosinophils Relative: 3.6 %
HCT: 37.4 % (ref 35.0–45.0)
Hemoglobin: 12 g/dL (ref 11.7–15.5)
Lymphs Abs: 2605 cells/uL (ref 850–3900)
MCH: 28.8 pg (ref 27.0–33.0)
MCHC: 32.1 g/dL (ref 32.0–36.0)
MCV: 89.9 fL (ref 80.0–100.0)
MPV: 10.5 fL (ref 7.5–12.5)
Monocytes Relative: 8.5 %
Neutro Abs: 2708 cells/uL (ref 1500–7800)
Neutrophils Relative %: 44.4 %
Platelets: 254 10*3/uL (ref 140–400)
RBC: 4.16 10*6/uL (ref 3.80–5.10)
RDW: 13.1 % (ref 11.0–15.0)
Total Lymphocyte: 42.7 %
WBC: 6.1 10*3/uL (ref 3.8–10.8)

## 2019-11-09 LAB — LIPID PANEL
Cholesterol: 103 mg/dL (ref <200)
HDL: 44 mg/dL — ABNORMAL LOW (ref >=50)
LDL Cholesterol (Calc): 42 mg/dL (calc)
Non-HDL Cholesterol (Calc): 59 mg/dL (calc) (ref <130)
Total CHOL/HDL Ratio: 2.3 (calc) (ref <5.0)
Triglycerides: 83 mg/dL (ref <150)

## 2019-11-09 LAB — COMPLETE METABOLIC PANEL WITH GFR
AG Ratio: 1.6 (calc) (ref 1.0–2.5)
ALT: 9 U/L (ref 6–29)
AST: 17 U/L (ref 10–35)
Albumin: 3.8 g/dL (ref 3.6–5.1)
Alkaline phosphatase (APISO): 79 U/L (ref 37–153)
BUN/Creatinine Ratio: 19 (calc) (ref 6–22)
BUN: 19 mg/dL (ref 7–25)
CO2: 28 mmol/L (ref 20–32)
Calcium: 9 mg/dL (ref 8.6–10.4)
Chloride: 104 mmol/L (ref 98–110)
Creat: 1.02 mg/dL — ABNORMAL HIGH (ref 0.60–0.88)
GFR, Est African American: 55 mL/min/{1.73_m2} — ABNORMAL LOW (ref >=60)
GFR, Est Non African American: 47 mL/min/{1.73_m2} — ABNORMAL LOW (ref >=60)
Globulin: 2.4 g/dL (calc) (ref 1.9–3.7)
Glucose, Bld: 87 mg/dL (ref 65–99)
Potassium: 4.5 mmol/L (ref 3.5–5.3)
Sodium: 138 mmol/L (ref 135–146)
Total Bilirubin: 0.3 mg/dL (ref 0.2–1.2)
Total Protein: 6.2 g/dL (ref 6.1–8.1)

## 2019-11-12 ENCOUNTER — Encounter: Payer: Self-pay | Admitting: Internal Medicine

## 2019-11-12 ENCOUNTER — Non-Acute Institutional Stay: Payer: Medicare Other | Admitting: Internal Medicine

## 2019-11-12 ENCOUNTER — Other Ambulatory Visit: Payer: Self-pay

## 2019-11-12 VITALS — BP 120/70 | HR 73 | Temp 97.1°F | Ht 62.0 in | Wt 128.0 lb

## 2019-11-12 DIAGNOSIS — E785 Hyperlipidemia, unspecified: Secondary | ICD-10-CM | POA: Diagnosis not present

## 2019-11-12 DIAGNOSIS — G8929 Other chronic pain: Secondary | ICD-10-CM | POA: Diagnosis not present

## 2019-11-12 DIAGNOSIS — I10 Essential (primary) hypertension: Secondary | ICD-10-CM | POA: Diagnosis not present

## 2019-11-12 DIAGNOSIS — M545 Low back pain: Secondary | ICD-10-CM

## 2019-11-12 NOTE — Progress Notes (Signed)
Location:  [facility] [employer] [facility] teacher of Service:  Clinic (12)  Provider:   Code Status:  Goals of Care:  Advanced Directives 05/27/2019  Does Patient Have a Medical Advance Directive? Yes  Type of Advance Directive Living will;Healthcare Power of Attorney  Does patient want to make changes to medical advance directive? No - Patient declined  Copy of Healthcare Power of Attorney in Chart? Yes - validated most recent copy scanned in chart (See row information)     Chief Complaint  Patient presents with  . Medical Management of Chronic Issues    Patient returns to the clinic for follow up.     HPI: Patient is a 86 y.o. female seen today for medical management of chronic diseases.   Has h/o Hypertension, Hyperlipidemia, Exudative AMD, Low Back Pain  Came for Follow up. No Acute Issues. Walks with the walker. No Falls. Has 2 sons one in [place] who is POA Still Drives Short distance. No Cognitive issues  Past Medical History:  Diagnosis Date  . Acute upper respiratory infections of unspecified site 2013  . Cellulitis and abscess of hand, except fingers and thumb 10/04/2010  . Degeneration of intervertebral disc, site unspecified 2000  . Diaphragmatic hernia without mention of obstruction or gangrene 08/15/1998  . Diverticulosis of colon (without mention of hemorrhage) 2000  . Hearing loss 09/28/2015  . Hyperlipidemia 2007  . Hypertension 2007  . Impacted cerumen   . Loss of weight 04/25/2011  . Myalgia and myositis, unspecified 01/07/2007  . Nontoxic uninodular goiter 08/15/1998  . Osteoarthrosis, unspecified whether generalized or localized, unspecified site 07/22/2000  . Osteoporosis 2000  . Other drug allergy(995.27) 04/04/2011  . Pain in limb 2012  . Personal history of fall 2012  . Pulmonary hypertension (HCC) 07/25/14   Mild-moderate  . Spinal stenosis, unspecified region other than cervical 2002  . Sprain of ankle, left   . Supraventricular premature beats 10/2006   . Tricuspid regurgitation 07/25/14   Mild-moderate  . 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 Urinary tract infection, site not specified   . Urine frequency 01/20/2014  . Urticaria, unspecified 11/01/2008  . Vitamin D deficiency 04/25/2011    Past Surgical History:  Procedure Laterality Date  . BACK SURGERY  1990   HNP L3-4 Dr. Lynnette Caffey  . CATARACT EXTRACTION W/ INTRAOCULAR LENS IMPLANT Right 05/19/2014   Dr. Dione Booze  . COLONOSCOPY  1992   acute segmental colitis Dr. Russella Dar  . FACIAL COSMETIC SURGERY  1995   Dr. Charolotte Eke  . LARYNGOSCOPY  1987   and biopsy  Dr. Haroldine Laws    Allergies  Allergen Reactions  . Doxycycline Itching  . Lipitor [Atorvastatin] Other (See Comments)    PAIN IN LEGS  . Pantoprazole Itching  . Pepcid [Famotidine] Itching    Outpatient Encounter Medications as of 11/12/2019  Medication Sig  . cholecalciferol (VITAMIN D) 1000 UNITS tablet Take 1,000 Units by mouth daily.   Marland Kitchen docusate sodium (COLACE) 100 MG capsule Take 100 mg by mouth 3 (three) times daily. 7:30am, 2pm, and bedtime -to prevent constipation  . HYDROcodone-acetaminophen (NORCO/VICODIN) 5-325 MG per tablet Take 1 tablet by mouth 3 (three) times daily. 7:30am, 2pm, and bedtime. Provided by DR RAMOS office.   . Magnesium 250 MG TABS Take 250 mg by mouth daily at 2 PM.   . metoprolol succinate (TOPROL-XL) 25 MG 24 hr tablet TAKE 1 TABLET ONCE DAILY.  . Multiple Vitamins-Minerals (PRESERVISION AREDS 2) CAPS Take 1 capsule  by mouth daily.   . rosuvastatin (CRESTOR) 5 MG tablet TAKE 1 TABLET DAILY TO LOWER CHOLESTEROL.   No facility-administered encounter medications on file as of 11/12/2019.    Review of Systems:  Review of Systems  Review of Systems  Constitutional: Negative for activity change, appetite change, chills, diaphoresis, fatigue and fever.  HENT: Negative for mouth sores, postnasal drip, rhinorrhea, sinus pain and sore throat.   Respiratory: Negative for  apnea, cough, chest tightness, shortness of breath and wheezing.   Cardiovascular: Negative for chest pain, palpitations and leg swelling.  Gastrointestinal: Negative for abdominal distention, abdominal pain, constipation, diarrhea, nausea and vomiting.  Genitourinary: Negative for dysuria and frequency.  Musculoskeletal: Negative for arthralgias, joint swelling and myalgias.  Skin: Negative for rash.  Neurological: Negative for dizziness, syncope, weakness, light-headedness and numbness.  Psychiatric/Behavioral: Negative for behavioral problems, confusion and sleep disturbance.     Health Maintenance  Topic Date Due  . DEXA SCAN  Never done  . INFLUENZA VACCINE  10/10/2019  . PNA vac Low Risk Adult (2 of 2 - PPSV23) 12/10/2019  . TETANUS/TDAP  04/24/2021  . COVID-19 Vaccine  Completed    Physical Exam: Vitals:   11/12/19 0909  BP: 120/70  Pulse: 73  Temp: (!) 97.1 F (36.2 C)  SpO2: 97%  Weight: 128 lb (58.1 kg)  Height: 5\' 2"  (1.575 m)   Body mass index is 23.41 kg/m. Physical Exam  Constitutional: Oriented to person, place, and time. Well-developed and well-nourished.  HENT:  Head: Normocephalic.  Mouth/Throat: Oropharynx is clear and moist.  Has Stained teeth Ears Mild Wax in Both Ears Eyes: Pupils are equal, round, and reactive to light.  Neck: Neck supple.  Cardiovascular: Normal rate and normal heart sounds.  No murmur heard. Pulmonary/Chest: Effort normal and breath sounds normal. No respiratory distress. No wheezes. She has no rales.  Abdominal: Soft. Bowel sounds are normal. No distension. There is no tenderness. There is no rebound.  Musculoskeletal: No edema.  Lymphadenopathy: none Neurological: Alert and oriented to person, place, and time.  Skin: Skin is warm and dry.  Psychiatric: Normal mood and affect. Behavior is normal. Thought content normal.    Labs reviewed: Basic Metabolic Panel: Recent Labs    11/08/19 0835  NA 138  K 4.5  CL 104    CO2 28  GLUCOSE 87  BUN 19  CREATININE 1.02*  CALCIUM 9.0   Liver Function Tests: Recent Labs    11/08/19 0835  AST 17  ALT 9  BILITOT 0.3  PROT 6.2   No results for input(s): LIPASE, AMYLASE in the last 8760 hours. No results for input(s): AMMONIA in the last 8760 hours. CBC: Recent Labs    11/08/19 0835  WBC 6.1  NEUTROABS 2,708  HGB 12.0  HCT 37.4  MCV 89.9  PLT 254   Lipid Panel: Recent Labs    11/08/19 0835  CHOL 103  HDL 44*  LDLCALC 42  TRIG 83  CHOLHDL 2.3   Lab Results  Component Value Date   HGBA1C 5.7 (H) 07/25/2014    Procedures since last visit: No results found.  Assessment/Plan Essential hypertension BP controlled on Metoprolol Chronic midline low back pain without sciatica Norco 3/day Per Dr 07/27/2014 Hyperlipidemia LDL goal <70 LDL less then 100 Low doe of Crestor Constipation On Colasce   Labs/tests ordered:  * No order type specified * Next appt:  Visit date not found

## 2019-12-08 ENCOUNTER — Other Ambulatory Visit: Payer: Self-pay | Admitting: Nurse Practitioner

## 2019-12-08 DIAGNOSIS — E785 Hyperlipidemia, unspecified: Secondary | ICD-10-CM

## 2019-12-08 NOTE — Telephone Encounter (Signed)
High risk or very high risk warning populated when attempting to refill medication. RX request sent to PCP for review and approval if warranted.   

## 2019-12-09 ENCOUNTER — Other Ambulatory Visit: Payer: Self-pay | Admitting: Nurse Practitioner

## 2019-12-09 DIAGNOSIS — Z23 Encounter for immunization: Secondary | ICD-10-CM

## 2020-02-07 ENCOUNTER — Other Ambulatory Visit: Payer: Self-pay | Admitting: Cardiovascular Disease

## 2020-02-07 DIAGNOSIS — I10 Essential (primary) hypertension: Secondary | ICD-10-CM

## 2020-02-07 NOTE — Telephone Encounter (Signed)
Rx has been sent to the pharmacy electronically. ° °

## 2020-03-07 ENCOUNTER — Other Ambulatory Visit: Payer: Self-pay | Admitting: Cardiovascular Disease

## 2020-03-07 DIAGNOSIS — I10 Essential (primary) hypertension: Secondary | ICD-10-CM

## 2020-04-01 ENCOUNTER — Other Ambulatory Visit: Payer: Self-pay | Admitting: Cardiovascular Disease

## 2020-04-01 DIAGNOSIS — I10 Essential (primary) hypertension: Secondary | ICD-10-CM

## 2020-04-07 ENCOUNTER — Other Ambulatory Visit (HOSPITAL_COMMUNITY): Payer: Self-pay | Admitting: Physical Medicine and Rehabilitation

## 2020-04-07 ENCOUNTER — Ambulatory Visit
Admission: RE | Admit: 2020-04-07 | Discharge: 2020-04-07 | Disposition: A | Discharge status: Home / Self Care | Payer: Self-pay | Source: Ambulatory Visit | Attending: Physical Medicine and Rehabilitation | Admitting: Physical Medicine and Rehabilitation

## 2020-04-07 DIAGNOSIS — R52 Pain, unspecified: Secondary | ICD-10-CM

## 2020-04-12 ENCOUNTER — Telehealth (HOSPITAL_COMMUNITY): Payer: Self-pay

## 2020-04-12 ENCOUNTER — Other Ambulatory Visit (HOSPITAL_COMMUNITY): Payer: Self-pay | Admitting: Interventional Radiology

## 2020-04-12 DIAGNOSIS — S32020A Wedge compression fracture of second lumbar vertebra, initial encounter for closed fracture: Secondary | ICD-10-CM

## 2020-04-12 NOTE — Telephone Encounter (Signed)
L2 vp/kp, approved by [facility] 04/10/20

## 2020-04-17 ENCOUNTER — Other Ambulatory Visit: Payer: Self-pay | Admitting: Radiology

## 2020-04-18 ENCOUNTER — Ambulatory Visit (HOSPITAL_COMMUNITY)
Admission: RE | Admit: 2020-04-18 | Discharge: 2020-04-18 | Disposition: A | Discharge status: Home / Self Care | Payer: Medicare Other | Source: Ambulatory Visit | Attending: Interventional Radiology | Admitting: Interventional Radiology

## 2020-04-18 ENCOUNTER — Encounter (HOSPITAL_COMMUNITY): Payer: Self-pay

## 2020-04-18 ENCOUNTER — Other Ambulatory Visit: Payer: Self-pay

## 2020-04-18 DIAGNOSIS — S32020A Wedge compression fracture of second lumbar vertebra, initial encounter for closed fracture: Secondary | ICD-10-CM | POA: Insufficient documentation

## 2020-04-18 DIAGNOSIS — X58XXXA Exposure to other specified factors, initial encounter: Secondary | ICD-10-CM | POA: Diagnosis not present

## 2020-04-18 DIAGNOSIS — Z79899 Other long term (current) drug therapy: Secondary | ICD-10-CM | POA: Diagnosis not present

## 2020-04-18 HISTORY — PX: IR KYPHO LUMBAR INC FX REDUCE BONE BX UNI/BIL CANNULATION INC/IMAGING: IMG5519

## 2020-04-18 LAB — CBC
HCT: 37.6 % (ref 36.0–46.0)
Hemoglobin: 12.5 g/dL (ref 12.0–15.0)
MCH: 30.6 pg (ref 26.0–34.0)
MCHC: 33.2 g/dL (ref 30.0–36.0)
MCV: 91.9 fL (ref 80.0–100.0)
Platelets: 238 10*3/uL (ref 150–400)
RBC: 4.09 MIL/uL (ref 3.87–5.11)
RDW: 14.7 % (ref 11.5–15.5)
WBC: 5.2 10*3/uL (ref 4.0–10.5)
nRBC: 0 % (ref 0.0–0.2)

## 2020-04-18 LAB — BASIC METABOLIC PANEL
Anion gap: 10 (ref 5–15)
BUN: 20 mg/dL (ref 8–23)
CO2: 24 mmol/L (ref 22–32)
Calcium: 9 mg/dL (ref 8.9–10.3)
Chloride: 101 mmol/L (ref 98–111)
Creatinine, Ser: 1.23 mg/dL — ABNORMAL HIGH (ref 0.44–1.00)
GFR, Estimated: 41 mL/min — ABNORMAL LOW (ref >60)
Glucose, Bld: 98 mg/dL (ref 70–99)
Potassium: 4.2 mmol/L (ref 3.5–5.1)
Sodium: 135 mmol/L (ref 135–145)

## 2020-04-18 LAB — PROTIME-INR
INR: 1.1 (ref 0.8–1.2)
Prothrombin Time: 14 seconds (ref 11.4–15.2)

## 2020-04-18 MED ORDER — HYDROMORPHONE HCL 1 MG/ML IJ SOLN
INTRAMUSCULAR | Status: AC
  Filled 2020-04-18: qty 1

## 2020-04-18 MED ORDER — MIDAZOLAM HCL 2 MG/2ML IJ SOLN
INTRAMUSCULAR | Status: AC | PRN
  Administered 2020-04-18: 0.5 mg via INTRAVENOUS
  Administered 2020-04-18: 1 mg via INTRAVENOUS
  Administered 2020-04-18: 0.5 mg via INTRAVENOUS

## 2020-04-18 MED ORDER — FENTANYL CITRATE (PF) 100 MCG/2ML IJ SOLN
INTRAMUSCULAR | Status: AC | PRN
  Administered 2020-04-18: 12.5 ug via INTRAVENOUS
  Administered 2020-04-18: 25 ug via INTRAVENOUS
  Administered 2020-04-18 (×2): 12.5 ug via INTRAVENOUS
  Administered 2020-04-18: 25 ug via INTRAVENOUS

## 2020-04-18 MED ORDER — CEFAZOLIN SODIUM-DEXTROSE 2-4 GM/100ML-% IV SOLN
INTRAVENOUS | Status: AC
  Administered 2020-04-18: 2 g via INTRAVENOUS
  Filled 2020-04-18: qty 100

## 2020-04-18 MED ORDER — CEFAZOLIN SODIUM-DEXTROSE 2-4 GM/100ML-% IV SOLN: 2.0000 g | INTRAVENOUS | Status: AC

## 2020-04-18 MED ORDER — IOHEXOL 300 MG/ML  SOLN: 50.0000 mL | Freq: Once | INTRAMUSCULAR | Status: DC | PRN

## 2020-04-18 MED ORDER — BUPIVACAINE HCL (PF) 0.5 % IJ SOLN
INTRAMUSCULAR | Status: AC
  Administered 2020-04-18: 30 mL
  Filled 2020-04-18: qty 30

## 2020-04-18 MED ORDER — TOBRAMYCIN SULFATE 1.2 G IJ SOLR
INTRAMUSCULAR | Status: AC
  Administered 2020-04-18: 0.2 g via INTRATHECAL
  Filled 2020-04-18: qty 1.2

## 2020-04-18 MED ORDER — SODIUM CHLORIDE 0.9 % IV SOLN: INTRAVENOUS | Status: AC

## 2020-04-18 MED ORDER — FENTANYL CITRATE (PF) 100 MCG/2ML IJ SOLN
INTRAMUSCULAR | Status: AC
  Filled 2020-04-18: qty 2

## 2020-04-18 MED ORDER — MIDAZOLAM HCL 2 MG/2ML IJ SOLN
INTRAMUSCULAR | Status: AC
  Filled 2020-04-18: qty 2

## 2020-04-18 MED ORDER — HYDROMORPHONE HCL 1 MG/ML IJ SOLN
INTRAMUSCULAR | Status: AC | PRN
  Administered 2020-04-18 (×2): 0.5 mg via INTRAVENOUS

## 2020-04-18 MED ORDER — SODIUM CHLORIDE 0.9 % IV SOLN: INTRAVENOUS | Status: DC

## 2020-04-18 NOTE — Procedures (Signed)
S/P L 2 balloon KP with biopsy bipedicular approach. S.Deveshwar MD.

## 2020-04-18 NOTE — Discharge Instructions (Signed)
1.No stooping or bending or lifting more than 10 lbs for 2 weeks. 2.Use walker to ambulate for 2 weeks. 3.No driving for 2 weeks. 4.See PRN  2 weeks. 5. Biopsy results to referring MD  KYPHOPLASTY/VERTEBROPLASTY DISCHARGE INSTRUCTIONS  Medications: (check all that apply)     Resume all home medications as before procedure.             Continue your pain medications as prescribed as needed.  Over the next 3-5 days, decrease your pain medication as tolerated.  Over the counter medications (i.e. Tylenol, ibuprofen, and aleve) may be substituted once severe/moderate pain symptoms have subsided.   Wound Care: - Bandages may be removed the day following your procedure.  You may get your incision wet once bandages are removed.  Bandaids may be used to cover the incisions until scab formation.   - If you develop a fever greater than 101 degrees, have increased skin redness at the incision sites or pus-like oozing from incisions occurring within 1 week of the procedure, contact ordering physician  - Ice pack to back for 15-20 minutes 2-3 time per day for first 2-3 days post procedure.  The ice will expedite muscle healing and help with the pain from the incisions.   Activity: - Bedrest today with limited activity for 24 hours post procedure.  - No driving for 2 weeks.  - Increase your activity as tolerated after bedrest (with assistance if necessary).  - Refrain from any strenuous activity or heavy lifting (greater than 10 lbs.).   Follow up: -   - If a biopsy was performed at the time of your procedure, your referring physician should receive the results in usually 2-3 days.

## 2020-04-18 NOTE — H&P (Signed)
Chief Complaint: Patient was seen in consultation today for Lumbar 2 Kyphoplasty at the request of Dr Rosine Abe  Supervising Physician: Julieanne Cotton  Patient Status: [facility] - Out-pt  History of Present Illness: [name] is a 87 y.o. female   Pt denies injury Has felt low back pain for months Worsening  She says pain is with almost any movement--- seems to subside if she just sits or lays still Using walker to get around Unable to do light housework --- as she is used to  Has seen Dr Ethelene Hal Imaging revealing acute Lumbar 2 compression fracture He has referred pt to Dr Corliss Skains for evaluation and management of L2 painful fracture  Dr Corliss Skains approves procedure    Past Medical History:  Diagnosis Date  . Acute upper respiratory infections of unspecified site 2013  . Cellulitis and abscess of hand, except fingers and thumb 10/04/2010  . Degeneration of intervertebral disc, site unspecified 2000  . Diaphragmatic hernia without mention of obstruction or gangrene 08/15/1998  . Diverticulosis of colon (without mention of hemorrhage) 2000  . Hearing loss 09/28/2015  . Hyperlipidemia 2007  . Hypertension 2007  . Impacted cerumen   . Loss of weight 04/25/2011  . Myalgia and myositis, unspecified 01/07/2007  . Nontoxic uninodular goiter 08/15/1998  . Osteoarthrosis, unspecified whether generalized or localized, unspecified site 07/22/2000  . Osteoporosis 2000  . Other drug allergy(995.27) 04/04/2011  . Pain in limb 2012  . Personal history of fall 2012  . Pulmonary hypertension (HCC) 07/25/14   Mild-moderate  . Spinal stenosis, unspecified region other than cervical 2002  . Sprain of ankle, left   . Supraventricular premature beats 10/2006  . Tricuspid regurgitation 07/25/14   Mild-moderate  . 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 Urinary tract infection, site not specified   . Urine frequency 01/20/2014  . Urticaria, unspecified 11/01/2008  .  Vitamin D deficiency 04/25/2011    Past Surgical History:  Procedure Laterality Date  . BACK SURGERY  1990   HNP L3-4 Dr. Lynnette Caffey  . CATARACT EXTRACTION W/ INTRAOCULAR LENS IMPLANT Right 05/19/2014   Dr. Dione Booze  . COLONOSCOPY  1992   acute segmental colitis Dr. Russella Dar  . FACIAL COSMETIC SURGERY  1995   Dr. Charolotte Eke  . LARYNGOSCOPY  1987   and biopsy  Dr. Haroldine Laws    Allergies: Doxycycline, Lipitor [atorvastatin], Pantoprazole, and Pepcid [famotidine]  Medications: Prior to Admission medications   Medication Sig Start Date End Date Taking? Authorizing Provider  cholecalciferol (VITAMIN D) 1000 UNITS tablet Take 1,000 Units by mouth daily.    Yes [provider]  docusate sodium (COLACE) 100 MG capsule Take 100 mg by mouth 2 (two) times daily as needed for mild constipation.   Yes [provider]  HYDROcodone-acetaminophen (NORCO/VICODIN) 5-325 MG per tablet Take 1 tablet by mouth 3 (three) times daily as needed for moderate pain. 06/17/12  Yes [provider]  Magnesium 250 MG TABS Take 250 mg by mouth daily at 2 PM.    Yes [provider]  metoprolol succinate (TOPROL-XL) 25 MG 24 hr tablet TAKE 1 TABLET ONCE DAILY. Patient taking differently: Take 25 mg by mouth daily. 03/07/20  Yes Chilton Si, MD  rosuvastatin (CRESTOR) 5 MG tablet TAKE 1 TABLET DAILY TO LOWER CHOLESTEROL. Patient taking differently: Take 5 mg by mouth daily. 12/08/19  Yes Mahlon Gammon, MD     Family History  Problem Relation Age of Onset  . Stroke Mother   .  Stroke Father   . Hypertension Brother   . Rheum arthritis Maternal Grandmother   . Pancreatic cancer Sister     Social History   Socioeconomic History  . Marital status: Widowed    Spouse name: Not on file  . Number of children: Not on file  . Years of education: Not on file  . Highest education level: Not on file  Occupational History  . Occupation: retired Film/video editor  Tobacco Use  . Smoking  status: Never Smoker  . Smokeless tobacco: Never Used  Vaping Use  . Vaping Use: Never used  Substance and Sexual Activity  . Alcohol use: No  . Drug use: No  . Sexual activity: Never  Other Topics Concern  . Not on file  Social History Narrative   Lives at [facility] [place] since 7/[address]   Widowed   Never smoke   No alcohol   Exercise none   Still drives, around town    [employer] with walker   Living Will , POA          Social Determinants of Health   Financial Resource Strain: Not on file  Food Insecurity: Not on file  Transportation Needs: Not on file  Physical Activity: Not on file  Stress: Not on file  Social Connections: Not on file     Review of Systems: A 12 point ROS discussed and pertinent positives are indicated in the HPI above.  All other systems are negative.  Review of Systems  Constitutional: Positive for activity change. Negative for fatigue and fever.  Respiratory: Positive for shortness of breath. Negative for cough.   Cardiovascular: Negative for chest pain.  Gastrointestinal: Negative for abdominal pain.  Musculoskeletal: Positive for back pain and gait problem.  Neurological: Positive for weakness.  Psychiatric/Behavioral: Negative for behavioral problems and confusion.    Vital Signs: BP (!) 152/81 (BP Location: Right Arm)   Pulse 78   Temp (!) 97.4 F (36.3 C) (Oral)   Resp 19   Ht 5\' 4"  (1.626 m)   Wt 120 lb (54.4 kg)   SpO2 97%   BMI 20.60 kg/m   Physical Exam Vitals reviewed.  HENT:     Mouth/Throat:     Mouth: Mucous membranes are moist.  Cardiovascular:     Rate and Rhythm: Normal rate and regular rhythm.     Heart sounds: Normal heart sounds.  Pulmonary:     Effort: Pulmonary effort is normal.     Breath sounds: Wheezing present.  Abdominal:     Tenderness: There is no abdominal tenderness.  Musculoskeletal:        General: Normal range of motion.  Skin:    General: Skin is warm.  Neurological:     Mental Status:  She is alert and oriented to person, place, and time.  Psychiatric:        Behavior: Behavior normal.     Imaging: No results found.  Labs:  CBC: Recent Labs    11/08/19 0835  WBC 6.1  HGB 12.0  HCT 37.4  PLT 254    COAGS: No results for input(s): INR, APTT in the last 8760 hours.  BMP: Recent Labs    11/08/19 0835  NA 138  K 4.5  CL 104  CO2 28  GLUCOSE 87  BUN 19  CALCIUM 9.0  CREATININE 1.02*  GFRNONAA 47*  GFRAA 55*    LIVER FUNCTION TESTS: Recent Labs    11/08/19 0835  BILITOT 0.3  AST 17  ALT 9  PROT 6.2    TUMOR MARKERS: No results for input(s): AFPTM, CEA, CA199, CHROMGRNA in the last 8760 hours.  Assessment and Plan:  Low back pain for few months Denies injury Worsening pain Unable to continue normal activities Lumbar 2 acute compression fracture per imaging Scheduled now L2 Kyphoplasty Risks and benefits of Lumbar 2 Kyphoplasty were discussed with the patient including, but not limited to education regarding the natural healing process of compression fractures without intervention, bleeding, infection, cement migration which may cause spinal cord damage, paralysis, pulmonary embolism or even death.  This interventional procedure involves the use of X-rays and because of the nature of the planned procedure, it is possible that we will have prolonged use of X-ray fluoroscopy.  Potential radiation risks to you include (but are not limited to) the following: - A slightly elevated risk for cancer  several years later in life. This risk is typically less than 0.5% percent. This risk is low in comparison to the normal incidence of human cancer, which is 33% for women and 50% for men according to the American Cancer Society. - Radiation induced injury can include skin redness, resembling a rash, tissue breakdown / ulcers and hair loss (which can be temporary or permanent).   The likelihood of either of these occurring depends on the difficulty  of the procedure and whether you are sensitive to radiation due to previous procedures, disease, or genetic conditions.   IF your procedure requires a prolonged use of radiation, you will be notified and given written instructions for further action.  It is your responsibility to monitor the irradiated area for the 2 weeks following the procedure and to notify your physician if you are concerned that you have suffered a radiation induced injury.    All of the patient's questions were answered, patient is agreeable to proceed.  Consent signed and in chart.  Thank you for this interesting consult.  I greatly enjoyed meeting [name] and look forward to participating in their care.  A copy of this report was sent to the requesting provider on this date.  Electronically Signed: Robet Leu, PA-C 04/18/2020, 10:26 AM   I spent a total of  30 Minutes   in face to face in clinical consultation, greater than 50% of which was counseling/coordinating care for L2 KP

## 2020-04-18 NOTE — Sedation Documentation (Signed)
Attempted to call report to short stay. 

## 2020-04-20 LAB — SURGICAL PATHOLOGY

## 2020-04-27 ENCOUNTER — Other Ambulatory Visit: Payer: Self-pay | Admitting: Cardiovascular Disease

## 2020-04-27 DIAGNOSIS — I10 Essential (primary) hypertension: Secondary | ICD-10-CM

## 2020-05-11 ENCOUNTER — Encounter: Payer: Self-pay | Admitting: Nurse Practitioner

## 2020-05-18 ENCOUNTER — Encounter: Payer: Self-pay | Admitting: Nurse Practitioner

## 2020-05-30 ENCOUNTER — Other Ambulatory Visit: Payer: Self-pay | Admitting: Internal Medicine

## 2020-05-30 DIAGNOSIS — E785 Hyperlipidemia, unspecified: Secondary | ICD-10-CM

## 2020-05-30 NOTE — Telephone Encounter (Signed)
Patient has request refill on medication "Crestor 5mg :. Patient has allergy contraindication. Medication pend and sent to PCP Mast, Man X, NP

## 2020-06-01 ENCOUNTER — Non-Acute Institutional Stay: Payer: Medicare Other | Admitting: Nurse Practitioner

## 2020-06-01 ENCOUNTER — Other Ambulatory Visit: Payer: Self-pay

## 2020-06-01 ENCOUNTER — Encounter: Payer: Self-pay | Admitting: Nurse Practitioner

## 2020-06-01 DIAGNOSIS — I10 Essential (primary) hypertension: Secondary | ICD-10-CM | POA: Diagnosis not present

## 2020-06-01 DIAGNOSIS — N183 Chronic kidney disease, stage 3 unspecified: Secondary | ICD-10-CM | POA: Insufficient documentation

## 2020-06-01 DIAGNOSIS — E785 Hyperlipidemia, unspecified: Secondary | ICD-10-CM | POA: Diagnosis not present

## 2020-06-01 DIAGNOSIS — G8929 Other chronic pain: Secondary | ICD-10-CM

## 2020-06-01 DIAGNOSIS — M545 Low back pain, unspecified: Secondary | ICD-10-CM

## 2020-06-01 DIAGNOSIS — N1832 Chronic kidney disease, stage 3b: Secondary | ICD-10-CM

## 2020-06-01 NOTE — Assessment & Plan Note (Addendum)
Bun/creat 20/1.23, eGFR 41 04/18/20, trended up slightly, the patient is encouraged to increase fluid intake. Update CMP/eGFR prior to the next appointment.

## 2020-06-01 NOTE — Progress Notes (Signed)
Location:   clinic [place]   Place of Service:  Clinic (12) Provider: Marlana Latus NP  Code Status: DNR Goals of Care: [place] 04/18/2020  Does Patient Have a Medical Advance Directive? No  Type of Advance Directive -  Does patient want to make changes to medical advance directive? -  Copy of [place] in Chart? -  Would patient like information on creating a medical advance directive? No - Patient declined     Chief Complaint  Patient presents with  . Medical Management of Chronic Issues    Patient returns to the clinic for her 6 month follow up. She has no concerns.    HPI: Patient is a 87 y.o. female seen today for medical management of chronic diseases.      HTN, blood pressure is controlled on Metoprolol, Bun/creat 20/1.23 04/18/20  CKD Bun/creat 20/1.23, eGFR 41 04/18/20  Hyperlipidemia, stable on Crestor 18m qd, LDL 42 10/30/19  OA pain, lower back, f/u ortho,  stable, on Norco 5/3272mtid.  Constipation, stable, takes Colace prn   Past Medical History:  Diagnosis Date  . Acute upper respiratory infections of unspecified site 2013  . Cellulitis and abscess of hand, except fingers and thumb 10/04/2010  . Degeneration of intervertebral disc, site unspecified 2000  . Diaphragmatic hernia without mention of obstruction or gangrene 08/15/1998  . Diverticulosis of colon (without mention of hemorrhage) 2000  . Hearing loss 09/28/2015  . Hyperlipidemia 2007  . Hypertension 2007  . Impacted cerumen   . Loss of weight 04/25/2011  . Myalgia and myositis, unspecified 01/07/2007  . Nontoxic uninodular goiter 08/15/1998  . Osteoarthrosis, unspecified whether generalized or localized, unspecified site 07/22/2000  . Osteoporosis 2000  . Other drug allergy(995.27) 04/04/2011  . Pain in limb 2012  . Personal history of fall 2012  . Pulmonary hypertension ([place]5/16/16   Mild-moderate  . Spinal stenosis, unspecified region other than cervical 2002  . Sprain  of ankle, left   . Supraventricular premature beats 10/2006  . Tricuspid regurgitation 07/25/14   Mild-moderate  . 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 Urinary tract infection, site not specified   . Urine frequency 01/20/2014  . Urticaria, unspecified 11/01/2008  . Vitamin D deficiency 04/25/2011    Past Surgical History:  Procedure Laterality Date  . BACK SURGERY  1990   HNP L3-4 Dr. BlCheri Rous. CATARACT EXTRACTION W/ INTRAOCULAR LENS IMPLANT Right 05/19/2014   Dr. GrKaty Fitch. COLONOSCOPY  1992   acute segmental colitis Dr. StFuller Plan. FACIAL COSMETIC SURGERY  1995   Dr. PaJeanella Flattery. IR KYPHO LUMBAR INC FX REDUCE BONE BX UNI/BIL CANNULATION INC/IMAGING  04/18/2020  . LARYNGOSCOPY  1987   and biopsy  Dr. CrErnesto Rutherford  Allergies  Allergen Reactions  . Doxycycline Itching  . Lipitor [Atorvastatin] Other (See Comments)    PAIN IN LEGS  . Pantoprazole Itching  . Pepcid [Famotidine] Itching    Allergies as of 06/01/2020      Reactions   Doxycycline Itching   Lipitor [atorvastatin] Other (See Comments)   PAIN IN LEGS   Pantoprazole Itching   Pepcid [famotidine] Itching      Medication List       Accurate as of June 01, 2020 11:59 PM. If you have any questions, ask your nurse or doctor.        cholecalciferol 1000 units tablet Commonly known as: VITAMIN D Take 1,000 Units by mouth  daily.   docusate sodium 100 MG capsule Commonly known as: COLACE Take 100 mg by mouth 2 (two) times daily as needed for mild constipation.   HYDROcodone-acetaminophen 5-325 MG tablet Commonly known as: NORCO/VICODIN Take 1 tablet by mouth 3 (three) times daily as needed for moderate pain.   Magnesium 250 MG Tabs Take 250 mg by mouth daily at 2 PM.   metoprolol succinate 25 MG 24 hr tablet Commonly known as: TOPROL-XL TAKE 1 TABLET ONCE DAILY.   rosuvastatin 5 MG tablet Commonly known as: CRESTOR TAKE 1 TABLET DAILY TO LOWER CHOLESTEROL.       Review  of Systems:  Review of Systems  Constitutional: Negative for activity change, fever and unexpected weight change.       Lost #4Ibs in the past year.   HENT: Positive for hearing loss. Negative for congestion and voice change.   Eyes: Positive for visual disturbance.  Respiratory: Negative for cough, shortness of breath and wheezing.   Cardiovascular: Negative for leg swelling.  Gastrointestinal: Negative for abdominal pain, constipation, nausea and vomiting.  Genitourinary: Negative for dysuria, hematuria and urgency.       2x/night  Musculoskeletal: Positive for back pain and gait problem.       Lower back pain, travels to legs sometimes, f/u Ortho, improved.   Skin: Negative for color change.  Neurological: Negative for speech difficulty, weakness, light-headedness and headaches.  Psychiatric/Behavioral: Negative for behavioral problems and sleep disturbance. The patient is not nervous/anxious.     Health Maintenance  Topic Date Due  . DEXA SCAN  Never done  . INFLUENZA VACCINE  10/10/2019  . COVID-19 Vaccine (3 - Booster for Moderna series) 10/10/2019  . PNA vac Low Risk Adult (2 of 2 - PPSV23) 12/10/2019  . TETANUS/TDAP  04/24/2021  . HPV VACCINES  Aged Out    Physical Exam: Vitals:   06/01/20 1326  BP: (!) 142/88  Pulse: 67  Temp: (!) 96.4 F (35.8 C)  SpO2: 100%  Weight: 116 lb 3.2 oz (52.7 kg)  Height: 5' 4"  (1.626 m)   Body mass index is 19.95 kg/m. Physical Exam Vitals and nursing note reviewed.  Constitutional:      Appearance: Normal appearance.  HENT:     Head: Normocephalic and atraumatic.     Mouth/Throat:     Mouth: Mucous membranes are moist.  Eyes:     Extraocular Movements: Extraocular movements intact.     Conjunctiva/sclera: Conjunctivae normal.     Pupils: Pupils are equal, round, and reactive to light.  Cardiovascular:     Rate and Rhythm: Normal rate and regular rhythm.     Heart sounds: No murmur heard.   Pulmonary:     Breath sounds:  No rales.  Abdominal:     General: Bowel sounds are normal.     Palpations: Abdomen is soft.     Tenderness: There is no abdominal tenderness.  Musculoskeletal:     Cervical back: Normal range of motion and neck supple.     Right lower leg: No edema.     Left lower leg: No edema.     Comments: Ambulates with walker.   Skin:    General: Skin is warm and dry.  Neurological:     General: No focal deficit present.     Mental Status: She is alert and oriented to person, place, and time. Mental status is at baseline.     Motor: No weakness.     Coordination: Coordination normal.  Gait: Gait abnormal.  Psychiatric:        Mood and Affect: Mood normal.        Behavior: Behavior normal.        Thought Content: Thought content normal.        Judgment: Judgment normal.     Labs reviewed: Basic Metabolic Panel: Recent Labs    11/08/19 0835 04/18/20 0946  NA 138 135  K 4.5 4.2  CL 104 101  CO2 28 24  GLUCOSE 87 98  BUN 19 20  CREATININE 1.02* 1.23*  CALCIUM 9.0 9.0   Liver Function Tests: Recent Labs    11/08/19 0835  AST 17  ALT 9  BILITOT 0.3  PROT 6.2   No results for input(s): LIPASE, AMYLASE in the last 8760 hours. No results for input(s): AMMONIA in the last 8760 hours. CBC: Recent Labs    11/08/19 0835 04/18/20 0946  WBC 6.1 5.2  NEUTROABS 2,708  --   HGB 12.0 12.5  HCT 37.4 37.6  MCV 89.9 91.9  PLT 254 238   Lipid Panel: Recent Labs    11/08/19 0835  CHOL 103  HDL 44*  LDLCALC 42  TRIG 83  CHOLHDL 2.3   Lab Results  Component Value Date   HGBA1C 5.7 (H) 07/25/2014    Procedures since last visit: No results found.  Assessment/Plan  CKD (chronic kidney disease) stage 3, GFR 30-59 ml/min (HCC) Bun/creat 20/1.23, eGFR 41 04/18/20, trended up slightly, the patient is encouraged to increase fluid intake. Update CMP/eGFR prior to the next appointment.   Hyperlipidemia LDL goal <70  stable on Crestor 45m qd, LDL 42 10/30/19   Chronic lower  back pain OA pain, lower back, f/u ortho,  stable, on Norco 5/3226mtid.  Hypertension blood pressure is controlled on Metoprolol, Bun/creat 20/1.23 04/18/20    Labs/tests ordered: none  Next appt:  4 months.

## 2020-06-01 NOTE — Assessment & Plan Note (Signed)
blood pressure is controlled on Metoprolol, Bun/creat 20/1.23 04/18/20

## 2020-06-01 NOTE — Assessment & Plan Note (Signed)
stable on Crestor 5mg  qd, LDL 42 10/30/19

## 2020-06-01 NOTE — Assessment & Plan Note (Signed)
OA pain, lower back, f/u ortho,  stable, on Norco 5/325mg  tid.

## 2020-06-02 ENCOUNTER — Encounter: Payer: Self-pay | Admitting: Nurse Practitioner

## 2020-09-19 ENCOUNTER — Other Ambulatory Visit: Payer: Self-pay

## 2020-09-19 DIAGNOSIS — N1832 Chronic kidney disease, stage 3b: Secondary | ICD-10-CM

## 2020-09-19 LAB — COMPLETE METABOLIC PANEL WITH GFR
AG Ratio: 1.5 (calc) (ref 1.0–2.5)
ALT: 7 U/L (ref 6–29)
AST: 18 U/L (ref 10–35)
Albumin: 3.8 g/dL (ref 3.6–5.1)
Alkaline phosphatase (APISO): 70 U/L (ref 37–153)
BUN/Creatinine Ratio: 21 (calc) (ref 6–22)
BUN: 24 mg/dL (ref 7–25)
CO2: 26 mmol/L (ref 20–32)
Calcium: 9.1 mg/dL (ref 8.6–10.4)
Chloride: 105 mmol/L (ref 98–110)
Creat: 1.17 mg/dL — ABNORMAL HIGH (ref 0.60–0.95)
Globulin: 2.6 g/dL (calc) (ref 1.9–3.7)
Glucose, Bld: 82 mg/dL (ref 65–99)
Potassium: 4.3 mmol/L (ref 3.5–5.3)
Sodium: 139 mmol/L (ref 135–146)
Total Bilirubin: 0.4 mg/dL (ref 0.2–1.2)
Total Protein: 6.4 g/dL (ref 6.1–8.1)
eGFR: 43 mL/min/{1.73_m2} — ABNORMAL LOW (ref >=60)

## 2020-09-22 ENCOUNTER — Encounter: Payer: Self-pay | Admitting: Internal Medicine

## 2020-09-27 ENCOUNTER — Other Ambulatory Visit: Payer: Self-pay

## 2020-09-27 ENCOUNTER — Encounter: Payer: Self-pay | Admitting: Family Medicine

## 2020-09-27 ENCOUNTER — Non-Acute Institutional Stay: Payer: Medicare Other | Admitting: Family Medicine

## 2020-09-27 VITALS — BP 120/70 | HR 87 | Temp 98.0°F | Ht 65.0 in | Wt 115.6 lb

## 2020-09-27 DIAGNOSIS — E785 Hyperlipidemia, unspecified: Secondary | ICD-10-CM

## 2020-09-27 DIAGNOSIS — I10 Essential (primary) hypertension: Secondary | ICD-10-CM | POA: Diagnosis not present

## 2020-09-27 NOTE — Progress Notes (Addendum)
Provider:  Jacalyn Lefevre, MD  Careteam: Patient Care Team: Frederica Kuster, MD as PCP - General (Family Medicine) Chilton Si, MD as PCP - Cardiology (Cardiology) Myrtis Hopping, MD (Inactive) as Consulting Physician (Neurosurgery) Keturah Barre, MD as Consulting Physician (Otolaryngology) Meryl Dare, MD as Consulting Physician (Gastroenterology) Elliot Cousin, OD as Consulting Physician (Optometry) Sheran Luz, MD as Consulting Physician (Physical Medicine and Rehabilitation) Ranee Gosselin, MD as Consulting Physician (Orthopedic Surgery) [place] Chilton Si, MD as Attending Physician (Cardiology) Sherrie George, MD as Consulting Physician (Ophthalmology) Mast, Man X, NP as Nurse Practitioner (Internal Medicine)  PLACE OF SERVICE:  [facility]  Advanced Directive information    Allergies  Allergen Reactions   Doxycycline Itching   Lipitor [Atorvastatin] Other (See Comments)    PAIN IN LEGS   Pantoprazole Itching   Pepcid [Famotidine] Itching    No chief complaint on file.    HPI: Patient is a 87 y.o. female .  76-month follow-up.  She has no specific complaints today.  She has history of chronic kidney disease hypertension and chronic back pain.  She uses hydrocodone occasionally for the back pain and does not seem to be bothered with constipation.  She does take rosuvastatin 5 mg for cholesterol  Review of Systems:  Review of Systems  Constitutional: Negative.   HENT:  Positive for hearing loss.   Eyes: Negative.   Respiratory: Negative.    Cardiovascular: Negative.   Gastrointestinal: Negative.   Genitourinary: Negative.   Skin: Negative.   Neurological: Negative.   Psychiatric/Behavioral: Negative.    All other systems reviewed and are negative.  Past Medical History:  Diagnosis Date   Acute upper respiratory infections of unspecified site 2013   Cellulitis and abscess of hand, except fingers and thumb  10/04/2010   Degeneration of intervertebral disc, site unspecified 2000   Diaphragmatic hernia without mention of obstruction or gangrene 08/15/1998   Diverticulosis of colon (without mention of hemorrhage) 2000   Hearing loss 09/28/2015   Hyperlipidemia 2007   Hypertension 2007   Impacted cerumen    Loss of weight 04/25/2011   Myalgia and myositis, unspecified 01/07/2007   Nontoxic uninodular goiter 08/15/1998   Osteoarthrosis, unspecified whether generalized or localized, unspecified site 07/22/2000   Osteoporosis 2000   Other drug allergy(995.27) 04/04/2011   Pain in limb 2012   Personal history of fall 2012   Pulmonary hypertension (HCC) 07/25/14   Mild-moderate   Spinal stenosis, unspecified region other than cervical 2002   Sprain of ankle, left    Supraventricular premature beats 10/2006   Tricuspid regurgitation 07/25/14   Mild-moderate   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 Lynnette Caffey   CATARACT EXTRACTION W/ INTRAOCULAR LENS IMPLANT Right 05/19/2014   Dr. Dione Booze   COLONOSCOPY  1992   acute segmental colitis Dr. Russella Dar   FACIAL COSMETIC SURGERY  1995   Dr. Charolotte Eke   IR KYPHO LUMBAR INC FX REDUCE BONE BX UNI/BIL CANNULATION INC/IMAGING  04/18/2020   LARYNGOSCOPY  1987   and biopsy  Dr. Haroldine Laws   Social History:   reports that she has never smoked. She has never used smokeless tobacco. She reports that she does not drink alcohol and does not  use drugs.  Family History  Problem Relation Age of Onset   Stroke Mother    Stroke Father    Hypertension Brother    Rheum arthritis Maternal Grandmother    Pancreatic cancer Sister     Medications: Patient's Medications  New Prescriptions   No medications on file   Previous Medications   CHOLECALCIFEROL (VITAMIN D) 1000 UNITS TABLET    Take 1,000 Units by mouth daily.    DOCUSATE SODIUM (COLACE) 100 MG CAPSULE    Take 100 mg by mouth 2 (two) times daily as needed for mild constipation.   HYDROCODONE-ACETAMINOPHEN (NORCO/VICODIN) 5-325 MG PER TABLET    Take 1 tablet by mouth 3 (three) times daily as needed for moderate pain.   MAGNESIUM 250 MG TABS    Take 250 mg by mouth daily at 2 PM.    METOPROLOL SUCCINATE (TOPROL-XL) 25 MG 24 HR TABLET    TAKE 1 TABLET ONCE DAILY.   ROSUVASTATIN (CRESTOR) 5 MG TABLET    TAKE 1 TABLET DAILY TO LOWER CHOLESTEROL.  Modified Medications   No medications on file  Discontinued Medications   No medications on file    Physical Exam:  There were no vitals filed for this visit. There is no height or weight on file to calculate BMI. Wt Readings from Last 3 Encounters:  06/01/20 116 lb 3.2 oz (52.7 kg)  04/18/20 120 lb (54.4 kg)  11/12/19 128 lb (58.1 kg)    Physical Exam Vitals and nursing note reviewed.  Constitutional:      Appearance: Normal appearance.  HENT:     Ears:     Comments: Right EAC full of cerumen.  This was successfully irrigated Cardiovascular:     Rate and Rhythm: Normal rate and regular rhythm.  Pulmonary:     Effort: Pulmonary effort is normal.     Breath sounds: Normal breath sounds.  Musculoskeletal:     Comments: Uses walker for ambulation  Neurological:     General: No focal deficit present.     Mental Status: She is alert and oriented to person, place, and time.    Labs reviewed: Basic Metabolic Panel: Recent Labs    11/08/19 0835 04/18/20 0946 09/18/20 0929  NA 138 135 139  K 4.5 4.2 4.3  CL 104 101 105  CO2 28 24 26   GLUCOSE 87 98 82  BUN 19 20 24   CREATININE 1.02* 1.23* 1.17*  CALCIUM 9.0 9.0 9.1   Liver Function Tests: Recent Labs    11/08/19 0835 09/18/20 0929  AST 17 18  ALT 9 7  BILITOT 0.3 0.4  PROT 6.2 6.4   No results for input(s): LIPASE, AMYLASE  in the last 8760 hours. No results for input(s): AMMONIA in the last 8760 hours. CBC: Recent Labs    11/08/19 0835 04/18/20 0946  WBC 6.1 5.2  NEUTROABS 2,708  --   HGB 12.0 12.5  HCT 37.4 37.6  MCV 89.9 91.9  PLT 254 238   Lipid Panel: Recent Labs    11/08/19 0835  CHOL 103  HDL 44*  LDLCALC 42  TRIG 83  CHOLHDL 2.3   TSH: No results for input(s): TSH in the last 8760 hours. A1C: Lab Results  Component Value Date   HGBA1C 5.7 (H) 07/25/2014     Assessment/Plan  1. Primary hypertension Blood pressure is good today at 120/70.  Continue on metoprolol - CMP; Future  2. Hyperlipidemia LDL goal <70 Last LDL was 42.  She is due  for lipid check again the end of August but plan to put it off until the next visit here in 4 months - Lipid Panel; Future   Jacalyn Lefevre, MD [facility] [phone]

## 2020-10-02 ENCOUNTER — Telehealth: Payer: Self-pay | Admitting: *Deleted

## 2020-10-02 NOTE — Telephone Encounter (Signed)
Patient called and stated that she had an appointment with Dr. Hyacinth Meeker on 7/20 and her ears were cleaned.  Stated that now when she cleans them with a Kleenex there is blood on the end of the Kleenex from the Right ear. Stated that it is not a lot of blood and it does not hurt.   Patient cannot come into the office for an appointment due to transportation and the next [facility] appointment is Thursday.   Patient wants to know if she should worry about this or not. Stated that it is not hurting and not a lot of blood.   Please Advise. (Forwarded to Dinah due to Dr. Hyacinth Meeker out of office.)

## 2020-10-02 NOTE — Telephone Encounter (Signed)
Avoid using Kleenex for now and make arrangement for ear to be re-evaluated at the facility.If bleeding worsen please notify provider.

## 2020-10-03 NOTE — Telephone Encounter (Signed)
Patient notified and stated that her ear is much better and wants to hold off on making an appointment. Stated that she will call back if it worsens.

## 2020-10-03 NOTE — Addendum Note (Signed)
Addended by: Frederica Kuster on: 10/03/2020 08:25 AM   Modules accepted: Level of Service

## 2020-10-17 ENCOUNTER — Other Ambulatory Visit: Payer: Self-pay | Admitting: Nurse Practitioner

## 2020-10-17 DIAGNOSIS — E785 Hyperlipidemia, unspecified: Secondary | ICD-10-CM

## 2020-10-20 ENCOUNTER — Other Ambulatory Visit: Payer: Self-pay | Admitting: Nurse Practitioner

## 2020-10-20 DIAGNOSIS — E785 Hyperlipidemia, unspecified: Secondary | ICD-10-CM

## 2021-01-24 ENCOUNTER — Other Ambulatory Visit: Payer: Self-pay

## 2021-01-24 ENCOUNTER — Encounter: Payer: Medicare Other | Admitting: Family Medicine

## 2021-01-30 ENCOUNTER — Other Ambulatory Visit: Payer: Medicare Other

## 2021-01-30 ENCOUNTER — Other Ambulatory Visit: Payer: Self-pay

## 2021-01-30 DIAGNOSIS — E785 Hyperlipidemia, unspecified: Secondary | ICD-10-CM

## 2021-01-30 DIAGNOSIS — I10 Essential (primary) hypertension: Secondary | ICD-10-CM

## 2021-01-30 LAB — COMPREHENSIVE METABOLIC PANEL
AG Ratio: 1.6 (calc) (ref 1.0–2.5)
ALT: 12 U/L (ref 6–29)
AST: 20 U/L (ref 10–35)
Albumin: 4.1 g/dL (ref 3.6–5.1)
Alkaline phosphatase (APISO): 77 U/L (ref 37–153)
BUN/Creatinine Ratio: 20 (calc) (ref 6–22)
BUN: 25 mg/dL (ref 7–25)
CO2: 30 mmol/L (ref 20–32)
Calcium: 9.6 mg/dL (ref 8.6–10.4)
Chloride: 101 mmol/L (ref 98–110)
Creat: 1.25 mg/dL — ABNORMAL HIGH (ref 0.60–0.95)
Globulin: 2.5 g/dL (calc) (ref 1.9–3.7)
Glucose, Bld: 74 mg/dL (ref 65–99)
Potassium: 4.2 mmol/L (ref 3.5–5.3)
Sodium: 139 mmol/L (ref 135–146)
Total Bilirubin: 0.5 mg/dL (ref 0.2–1.2)
Total Protein: 6.6 g/dL (ref 6.1–8.1)

## 2021-01-30 LAB — LIPID PANEL
Cholesterol: 118 mg/dL (ref <200)
HDL: 49 mg/dL — ABNORMAL LOW (ref >=50)
LDL Cholesterol (Calc): 51 mg/dL (calc)
Non-HDL Cholesterol (Calc): 69 mg/dL (calc) (ref <130)
Total CHOL/HDL Ratio: 2.4 (calc) (ref <5.0)
Triglycerides: 101 mg/dL (ref <150)

## 2021-01-31 ENCOUNTER — Encounter: Payer: Self-pay | Admitting: *Deleted

## 2021-01-31 NOTE — Telephone Encounter (Signed)
error 

## 2021-02-14 ENCOUNTER — Encounter: Payer: Self-pay | Admitting: Family Medicine

## 2021-02-14 ENCOUNTER — Other Ambulatory Visit: Payer: Self-pay

## 2021-02-14 ENCOUNTER — Non-Acute Institutional Stay (INDEPENDENT_AMBULATORY_CARE_PROVIDER_SITE_OTHER): Payer: Medicare Other | Admitting: Family Medicine

## 2021-02-14 VITALS — BP 118/70 | HR 92 | Temp 98.4°F | Ht 65.0 in | Wt 114.4 lb

## 2021-02-14 DIAGNOSIS — M545 Low back pain, unspecified: Secondary | ICD-10-CM

## 2021-02-14 DIAGNOSIS — E785 Hyperlipidemia, unspecified: Secondary | ICD-10-CM | POA: Diagnosis not present

## 2021-02-14 DIAGNOSIS — N1832 Chronic kidney disease, stage 3b: Secondary | ICD-10-CM | POA: Diagnosis not present

## 2021-02-14 DIAGNOSIS — R413 Other amnesia: Secondary | ICD-10-CM

## 2021-02-14 DIAGNOSIS — R634 Abnormal weight loss: Secondary | ICD-10-CM

## 2021-02-14 DIAGNOSIS — G8929 Other chronic pain: Secondary | ICD-10-CM

## 2021-02-14 NOTE — Progress Notes (Signed)
Provider:  Alain Honey, MD  Careteam: Patient Care Team: Wardell Honour, MD as PCP - General (Family Medicine) Skeet Latch, MD as PCP - Cardiology (Cardiology) Pamella Pert, MD (Inactive) as Consulting Physician (Neurosurgery) Thornell Sartorius, MD as Consulting Physician (Otolaryngology) Ladene Artist, MD as Consulting Physician (Gastroenterology) Dyke Maes, OD as Consulting Physician (Optometry) Suella Broad, MD as Consulting Physician (Physical Medicine and Rehabilitation) Latanya Maudlin, MD as Consulting Physician (Orthopedic Surgery) [place], [facility] Skeet Latch, MD as Attending Physician (Cardiology) Hayden Pedro, MD as Consulting Physician (Ophthalmology) Mast, Man X, NP as Nurse Practitioner (Internal Medicine)  PLACE OF SERVICE:  [place]  Advanced Directive information    Allergies  Allergen Reactions   Doxycycline Itching   Lipitor [Atorvastatin] Other (See Comments)    PAIN IN LEGS   Pantoprazole Itching   Pepcid [Famotidine] Itching    No chief complaint on file.    HPI: Patient is a 87 y.o. female patient is here to follow-up blood work.  She had lipids checked on November 21.  LDL is at goal at 51.  Other labs include renal function which shows some decline since last check 5 months ago.  Liver function studies are all normal as is fasting blood glucose. Appetite is good sleeping well.  No history of falls. We reviewed her medicines which include rosuvastatin for cholesterol and metoprolol for blood pressure. She continues to have some back pain but that does not seem to be as much of a problem as before.  She has not taking any prescription meds for that complaint.  Review of Systems:  Review of Systems  Respiratory: Negative.    Cardiovascular: Negative.   Musculoskeletal:  Positive for back pain.  Neurological: Negative.   All other systems reviewed and are negative.  Past Medical History:   Diagnosis Date   Acute upper respiratory infections of unspecified site 2013   Cellulitis and abscess of hand, except fingers and thumb 10/04/2010   Degeneration of intervertebral disc, site unspecified 2000   Diaphragmatic hernia without mention of obstruction or gangrene 08/15/1998   Diverticulosis of colon (without mention of hemorrhage) 2000   Hearing loss 09/28/2015   Hyperlipidemia 2007   Hypertension 2007   Impacted cerumen    Loss of weight 04/25/2011   Myalgia and myositis, unspecified 01/07/2007   Nontoxic uninodular goiter 08/15/1998   Osteoarthrosis, unspecified whether generalized or localized, unspecified site 07/22/2000   Osteoporosis 2000   Other drug allergy(995.27) 04/04/2011   Pain in limb 2012   Personal history of fall 2012   Pulmonary hypertension ([place]) 07/25/14   Mild-moderate   Spinal stenosis, unspecified region other than cervical 2002   Sprain of ankle, left    Supraventricular premature beats 10/2006   Tricuspid regurgitation 07/25/14   Mild-moderate   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 Cheri Rous   CATARACT EXTRACTION W/ INTRAOCULAR LENS IMPLANT Right 05/19/2014   Dr. Katy Fitch   COLONOSCOPY  1992   acute segmental colitis Dr. Fuller Plan   FACIAL COSMETIC SURGERY  1995   Dr. Jeanella Flattery   IR [facility] LUMBAR INC FX REDUCE BONE BX UNI/BIL CANNULATION INC/IMAGING  04/18/2020   [place]   and biopsy  Dr. Ernesto Rutherford  Social History:   reports that she has never smoked. She has never used smokeless tobacco. She reports that she does not drink alcohol and does not use drugs.  Family History  Problem Relation Age of Onset   Stroke Mother    Stroke Father    Hypertension Brother    Rheum arthritis  Maternal Grandmother    Pancreatic cancer Sister     Medications: Patient's Medications  New Prescriptions   No medications on file  Previous Medications   CHOLECALCIFEROL (VITAMIN D) 1000 UNITS TABLET    Take 1,000 Units by mouth daily.    DOCUSATE SODIUM (COLACE) 100 MG CAPSULE    Take 100 mg by mouth 2 (two) times daily as needed for mild constipation.   HYDROCODONE-ACETAMINOPHEN (NORCO/VICODIN) 5-325 MG PER TABLET    Take 1 tablet by mouth 3 (three) times daily as needed for moderate pain.   MAGNESIUM 250 MG TABS    Take 250 mg by mouth daily at 2 PM.    METOPROLOL SUCCINATE (TOPROL-XL) 25 MG 24 HR TABLET    TAKE 1 TABLET ONCE DAILY.   ROSUVASTATIN (CRESTOR) 5 MG TABLET    TAKE 1 TABLET DAILY TO LOWER CHOLESTEROL.  Modified Medications   No medications on file  Discontinued Medications   No medications on file    Physical Exam:  There were no vitals filed for this visit. There is no height or weight on file to calculate BMI. Wt Readings from Last 3 Encounters:  09/27/20 115 lb 9.6 oz (52.4 kg)  06/01/20 116 lb 3.2 oz (52.7 kg)  04/18/20 120 lb (54.4 kg)    Physical Exam Vitals and nursing note reviewed.  Constitutional:      Appearance: Normal appearance.  Cardiovascular:     Rate and Rhythm: Normal rate and regular rhythm.  Pulmonary:     Effort: Pulmonary effort is normal.     Breath sounds: Normal breath sounds.  Neurological:     General: No focal deficit present.     Mental Status: She is oriented to person, place, and time.    Labs reviewed: Basic Metabolic Panel: Recent Labs    04/18/20 0946 09/18/20 0929 01/29/21 0853  NA 135 139 139  K 4.2 4.3 4.2  CL 101 105 101  CO2 24 26 30   GLUCOSE 98 82 74  BUN 20 24 25   CREATININE 1.23* 1.17* 1.25*  CALCIUM 9.0 9.1 9.6   Liver Function Tests: Recent Labs    09/18/20 0929 01/29/21 0853  AST 18 20  ALT 7 12  BILITOT 0.4 0.5  PROT 6.4 6.6   No results for input(s): LIPASE, AMYLASE in the last 8760  hours. No results for input(s): AMMONIA in the last 8760 hours. CBC: Recent Labs    04/18/20 0946  WBC 5.2  HGB 12.5  HCT 37.6  MCV 91.9  PLT 238   Lipid Panel: Recent Labs    01/29/21 0853  CHOL 118  HDL 49*  LDLCALC 51  TRIG 06/16/20  CHOLHDL 2.4   TSH: No results for input(s): TSH in the last 8760 hours. A1C: Lab Results  Component Value Date   HGBA1C 5.7 (H) 07/25/2014     Assessment/Plan  Chronic midline low back pain without sciatica  Stage 3b chronic kidney disease (HCC)  Hyperlipidemia LDL goal <70  Impaired memory  Loss of weight   237, MD [facility] [phone]

## 2021-06-06 ENCOUNTER — Other Ambulatory Visit: Payer: Self-pay

## 2021-06-06 ENCOUNTER — Non-Acute Institutional Stay: Payer: Medicare Other | Admitting: Family Medicine

## 2021-06-06 ENCOUNTER — Encounter: Payer: Self-pay | Admitting: Family Medicine

## 2021-06-06 VITALS — BP 118/70 | HR 72 | Temp 97.8°F | Ht 65.0 in | Wt 112.2 lb

## 2021-06-06 DIAGNOSIS — R062 Wheezing: Secondary | ICD-10-CM | POA: Diagnosis not present

## 2021-06-06 DIAGNOSIS — H903 Sensorineural hearing loss, bilateral: Secondary | ICD-10-CM

## 2021-06-06 DIAGNOSIS — M545 Low back pain, unspecified: Secondary | ICD-10-CM

## 2021-06-06 DIAGNOSIS — G8929 Other chronic pain: Secondary | ICD-10-CM

## 2021-06-06 DIAGNOSIS — N1832 Chronic kidney disease, stage 3b: Secondary | ICD-10-CM | POA: Diagnosis not present

## 2021-06-06 NOTE — Patient Instructions (Signed)
Stop the Crestor ?Start taking Calcium with Vitamin D. Medication was send into your pharmacy. ?

## 2021-06-06 NOTE — Progress Notes (Signed)
? ? ?Provider:  ?Jacalyn LefevreStephen Rihan Schueler, MD ? ?Careteam: ?Patient Care Team: ?Frederica KusterMiller, Ignatius Kloos M, MD as PCP - General (Family Medicine) ?Chilton Siandolph, Tiffany, MD as PCP - Cardiology (Cardiology) ?Myrtis HoppingBlomquist, Gustav, MD (Inactive) as Consulting Physician (Neurosurgery) ?Keturah Barrerossley, James J, MD as Consulting Physician (Otolaryngology) ?Meryl DareStark, Malcolm T, MD as Consulting Physician (Gastroenterology) ?Elliot CousinByrnes, Charles, OD as Consulting Physician (Optometry) ?Sheran Luzamos, Richard, MD as Consulting Physician (Physical Medicine and Rehabilitation) ?Ranee GosselinGioffre, Ronald, MD as Consulting Physician (Orthopedic Surgery) ?[place], [facility] ?Chilton Siandolph, Tiffany, MD as Attending Physician (Cardiology) ?Sherrie GeorgeMatthews, John D, MD as Consulting Physician (Ophthalmology) ?Mast, Man X, NP as Nurse Practitioner (Internal Medicine) ? ?[facility] SERV[facility]  [facility]  ?Advanced Directive information ?  ? ?Allergies  ?Allergen Reactions  ? Doxycycline Itching  ? Lipitor [Atorvastatin] Other (See Comments)  ?  PAIN IN LEGS  ? Pantoprazole Itching  ? Pepcid [Famotidine] Itching  ? ? ?Chief Complaint  ?Patient presents with  ? Medical Management of Chronic Issues  ?  Patient presents today for a 4 month follow-up.  ? Quality Metric Gaps  ?  DEXA scan, TDAP, pneumonia, COVID booster 3  ? ? ? ?HPI: Patient is a 88 y.o. female .  Patient is here for health maintenance of chronic problems including hypertension hyperlipidemia chronic low back pain.  She has no complaints today.  She has some difficulty in hearing and responding to my questions but I think her memory is okay for age when she is able to hear.  She has some hydrocodone that she has available to take for back pain but says she does not use it.  Also where her bones are concerned she has vitamin D but does not think she is taking that.  I told her I recommend most postmenopausal women taking combination vitamin D and calcium for bone health. ? ?Review of Systems:  ?Review of Systems  ?Constitutional: Negative.    ?HENT:  Positive for hearing loss.   ?Respiratory: Negative.    ?Cardiovascular: Negative.   ?Musculoskeletal:  Positive for back pain.  ?Neurological: Negative.   ? ?Past Medical History:  ?Diagnosis Date  ? Acute upper respiratory infections of unspecified site 2013  ? Cellulitis and abscess of hand, except fingers and thumb 10/04/2010  ? Degeneration of intervertebral disc, site unspecified 2000  ? Diaphragmatic hernia without mention of obstruction or gangrene 08/15/1998  ? Diverticulosis of colon (without mention of hemorrhage) 2000  ? Hearing loss 09/28/2015  ? Hyperlipidemia 2007  ? Hypertension 2007  ? Impacted cerumen   ? Loss of weight 04/25/2011  ? Myalgia and myositis, unspecified 01/07/2007  ? Nontoxic uninodular goiter 08/15/1998  ? Osteoarthrosis, unspecified whether generalized or localized, unspecified site 07/22/2000  ? Osteoporosis 2000  ? Other drug allergy(995.27) 04/04/2011  ? Pain in limb 2012  ? Personal history of fall 2012  ? Pulmonary hypertension (HCC) 07/25/14  ? Mild-moderate  ? Spinal stenosis, unspecified region other than cervical 2002  ? Sprain of ankle, left   ? Supraventricular premature beats 10/2006  ? Tricuspid regurgitation 07/25/14  ? Mild-moderate  ? 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 Urinary tract infection, site not specified   ? Urine frequency 01/20/2014  ? Urticaria, unspecified 11/01/2008  ? Vitamin D deficiency 04/25/2011  ? ?Past Surgical History:  ?Procedure Laterality Date  ? BACK SURGERY  1990  ? HNP L3-4 Dr. Lynnette CaffeyBlomquist  ? CATARACT EXTRACTION W/ INTRAOCULAR LENS IMPLANT Right 05/19/2014  ? Dr. Dione BoozeGroat  ? COLONOSCOPY  1992  ?  acute segmental colitis Dr. Russella Dar  ? FACIAL COSMETIC SURGERY  1995  ? Dr. Charolotte Eke  ? IR [facility] LUMBAR INC FX REDUCE BONE BX UNI/BIL CANNULATION INC/IMAGING  04/18/2020  ? LARYNGOSCOPY  1987  ? and biopsy  Dr. Haroldine Laws  ? ?Social History: ?  reports that she has never smoked. She has never used smokeless tobacco. She reports  that she does not drink alcohol and does not use drugs. ? ?Family History  ?Problem Relation Age of Onset  ? Stroke Mother   ? Stroke Father   ? Hypertension Brother   ? Rheum arthritis Maternal Grandmother   ? Pancreatic cancer Sister   ? ? ?Medications: ?Patient's Medications  ?New Prescriptions  ? No medications on file  ?Previous Medications  ? CHOLECALCIFEROL (VITAMIN D) 1000 UNITS TABLET    Take 1,000 Units by mouth daily.   ? DOCUSATE SODIUM (COLACE) 100 MG CAPSULE    Take 100 mg by mouth 2 (two) times daily as needed for mild constipation.  ? HYDROCODONE-ACETAMINOPHEN (NORCO/VICODIN) 5-325 MG PER TABLET    Take 1 tablet by mouth 3 (three) times daily as needed for moderate pain.  ? MAGNESIUM 250 MG TABS    Take 250 mg by mouth daily at 2 PM.   ? METOPROLOL SUCCINATE (TOPROL-XL) 25 MG 24 HR TABLET    TAKE 1 TABLET ONCE DAILY.  ? ROSUVASTATIN (CRESTOR) 5 MG TABLET    TAKE 1 TABLET DAILY TO LOWER CHOLESTEROL.  ?Modified Medications  ? No medications on file  ?Discontinued Medications  ? No medications on file  ? ? ?Physical Exam: ? ?There were no vitals filed for this visit. ?There is no height or weight on file to calculate BMI. ?Wt Readings from Last 3 Encounters:  ?02/14/21 114 lb 6.4 oz (51.9 kg)  ?09/27/20 115 lb 9.6 oz (52.4 kg)  ?06/01/20 116 lb 3.2 oz (52.7 kg)  ? ? ?Physical Exam ?Vitals and nursing note reviewed.  ?Constitutional:   ?   Appearance: Normal appearance.  ?Cardiovascular:  ?   Rate and Rhythm: Normal rate and regular rhythm.  ?Pulmonary:  ?   Effort: Pulmonary effort is normal.  ?Musculoskeletal:     ?   General: Normal range of motion.  ?   Comments: Uses walker as ambulation aid  ?Neurological:  ?   General: No focal deficit present.  ?   Mental Status: She is alert and oriented to person, place, and time.  ? ? ?Labs reviewed: ?Basic Metabolic Panel: ?Recent Labs  ?  09/18/20 ?0929 01/29/21 ?0853  ?NA 139 139  ?K 4.3 4.2  ?CL 105 101  ?CO2 26 30  ?GLUCOSE 82 74  ?BUN 24 25  ?CREATININE  1.17* 1.25*  ?CALCIUM 9.1 9.6  ? ?Liver Function Tests: ?Recent Labs  ?  09/18/20 ?0929 01/29/21 ?0853  ?AST 18 20  ?ALT 7 12  ?BILITOT 0.4 0.5  ?PROT 6.4 6.6  ? ?No results for input(s): LIPASE, AMYLASE in the last 8760 hours. ?No results for input(s): AMMONIA in the last 8760 hours. ?CBC: ?No results for input(s): WBC, NEUTROABS, HGB, HCT, MCV, PLT in the last 8760 hours. ?Lipid Panel: ?Recent Labs  ?  01/29/21 ?0853  ?CHOL 118  ?HDL 49*  ?LDLCALC 51  ?TRIG 101  ?CHOLHDL 2.4  ? ?TSH: ?No results for input(s): TSH in the last 8760 hours. ?A1C: ?Lab Results  ?Component Value Date  ? HGBA1C 5.7 (H) 07/25/2014  ? ? ? ?Assessment/Plan ? ? ?1. Chronic low  back pain, unspecified back pain laterality, unspecified whether sciatica present ?Does not really complain of back pain today but I have suggested some calcium and vitamin D would be good for bone health ? ?2. Stage 3b chronic kidney disease (HCC) ?Most recent chemistries show creatinine of 1.25 which represents a slight decline ? ?3. Expiratory wheezing ?She continues to have some expiratory wheezes.  She denies any dyspnea or shortness of breath and I do not think she is aware of wheezing so will not treat ? ?4. Sensorineural hearing loss (SNHL) of both ears ?She might benefit from hearing aids but she is able to converse with her friends and hear me during the exam today ? ?Jacalyn Lefevre, MD ?[facility] ?[phone]  ? ?

## 2021-06-29 ENCOUNTER — Other Ambulatory Visit: Payer: Self-pay | Admitting: Family Medicine

## 2021-06-29 DIAGNOSIS — E785 Hyperlipidemia, unspecified: Secondary | ICD-10-CM

## 2021-06-29 NOTE — Telephone Encounter (Signed)
Very high warning. Pended and routed to Richarda Bladeinah Ngetich, NP for approval ?

## 2021-10-16 ENCOUNTER — Other Ambulatory Visit: Payer: Self-pay | Admitting: Family

## 2021-10-16 DIAGNOSIS — E785 Hyperlipidemia, unspecified: Secondary | ICD-10-CM

## 2021-12-05 ENCOUNTER — Encounter: Payer: Self-pay | Admitting: Family Medicine

## 2021-12-05 ENCOUNTER — Non-Acute Institutional Stay: Payer: Medicare Other | Admitting: Family Medicine

## 2021-12-05 VITALS — BP 144/76 | HR 79 | Temp 98.1°F | Ht 65.0 in | Wt 110.6 lb

## 2021-12-05 DIAGNOSIS — I10 Essential (primary) hypertension: Secondary | ICD-10-CM

## 2021-12-05 DIAGNOSIS — N1832 Chronic kidney disease, stage 3b: Secondary | ICD-10-CM

## 2021-12-05 DIAGNOSIS — G8929 Other chronic pain: Secondary | ICD-10-CM

## 2021-12-05 DIAGNOSIS — M545 Low back pain, unspecified: Secondary | ICD-10-CM

## 2021-12-05 DIAGNOSIS — E785 Hyperlipidemia, unspecified: Secondary | ICD-10-CM | POA: Diagnosis not present

## 2021-12-05 DIAGNOSIS — H903 Sensorineural hearing loss, bilateral: Secondary | ICD-10-CM

## 2021-12-05 NOTE — Progress Notes (Signed)
Provider:  Jacalyn Lefevre, MD  Careteam: Patient Care Team: Frederica Kuster, MD as PCP - General (Family Medicine) Chilton Si, MD as PCP - Cardiology (Cardiology) Myrtis Hopping, MD (Inactive) as Consulting Physician (Neurosurgery) Keturah Barre, MD as Consulting Physician (Otolaryngology) Meryl Dare, MD as Consulting Physician (Gastroenterology) Elliot Cousin, OD as Consulting Physician (Optometry) Sheran Luz, MD as Consulting Physician (Physical Medicine and Rehabilitation) Ranee Gosselin, MD as Consulting Physician (Orthopedic Surgery) [place], [facility] Chilton Si, MD as Attending Physician (Cardiology) Sherrie George, MD as Consulting Physician (Ophthalmology) Mast, Man X, NP as Nurse Practitioner (Internal Medicine)  PLACE OF SERVICE:  [facility]  Advanced Directive information    Allergies  Allergen Reactions   Doxycycline Itching   Lipitor [Atorvastatin] Other (See Comments)    PAIN IN LEGS   Pantoprazole Itching   Pepcid [Famotidine] Itching    No chief complaint on file.    HPI: Patient is a 88 y.o. female patient had a 70-month follow-up visit to recheck her chronic kidney disease, hyperlipidemia, and low back pain. Regarding the latter she takes hydrocodone about every day.  Back pain has not worsened.  There is no radiation to her legs. Her last labs were done about a year ago and we need to reassess lipids as well as LFTs. She thought she was going to see the audiology today here in the clinic but that group left without seeing her and she is upset about that  Review of Systems:  Review of Systems  Constitutional: Negative.   Respiratory: Negative.    Cardiovascular: Negative.   Musculoskeletal:  Positive for back pain.  Neurological: Negative.   Psychiatric/Behavioral: Negative.    All other systems reviewed and are negative.   Past Medical History:  Diagnosis Date   Acute upper respiratory infections  of unspecified site 2013   Cellulitis and abscess of hand, except fingers and thumb 10/04/2010   Degeneration of intervertebral disc, site unspecified 2000   Diaphragmatic hernia without mention of obstruction or gangrene 08/15/1998   Diverticulosis of colon (without mention of hemorrhage) 2000   Hearing loss 09/28/2015   Hyperlipidemia 2007   Hypertension 2007   Impacted cerumen    Loss of weight 04/25/2011   Myalgia and myositis, unspecified 01/07/2007   Nontoxic uninodular goiter 08/15/1998   Osteoarthrosis, unspecified whether generalized or localized, unspecified site 07/22/2000   Osteoporosis 2000   Other drug allergy(995.27) 04/04/2011   Pain in limb 2012   Personal history of fall 2012   Pulmonary hypertension (HCC) 07/25/14   Mild-moderate   Spinal stenosis, unspecified region other than cervical 2002   Sprain of ankle, left    Supraventricular premature beats 10/2006   Tricuspid regurgitation 07/25/14   Mild-moderate   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 Lynnette Caffey   CATARACT EXTRACTION W/ INTRAOCULAR LENS IMPLANT Right 05/19/2014   Dr. Dione Booze   COLONOSCOPY  1992   acute segmental colitis Dr. Russella Dar   FACIAL COSMETIC SURGERY  1995   Dr. Charolotte Eke   IR [facility] LUMBAR INC FX REDUCE BONE BX UNI/BIL CANNULATION INC/IMAGING  04/18/2020   LARYNGOSCOPY  1987   and biopsy  Dr. Haroldine Laws   Social History:   reports that she has  never smoked. She has never used smokeless tobacco. She reports that she does not drink alcohol and does not use drugs.  Family History  Problem Relation Age of Onset   Stroke Mother    Stroke Father    Hypertension Brother    Rheum arthritis Maternal Grandmother    Pancreatic cancer Sister      Medications: Patient's Medications  New Prescriptions   No medications on file  Previous Medications   CHOLECALCIFEROL (VITAMIN D) 1000 UNITS TABLET    Take 1,000 Units by mouth daily.    DOCUSATE SODIUM (COLACE) 100 MG CAPSULE    Take 100 mg by mouth 2 (two) times daily as needed for mild constipation.   HYDROCODONE-ACETAMINOPHEN (NORCO/VICODIN) 5-325 MG PER TABLET    Take 1 tablet by mouth 3 (three) times daily as needed for moderate pain.   MAGNESIUM 250 MG TABS    Take 250 mg by mouth daily at 2 PM.    METOPROLOL SUCCINATE (TOPROL-XL) 25 MG 24 HR TABLET    TAKE 1 TABLET ONCE DAILY.   ROSUVASTATIN (CRESTOR) 5 MG TABLET    TAKE ONE TABLET BY MOUTH DAILY TO LOWER CHOLESTEROL  Modified Medications   No medications on file  Discontinued Medications   No medications on file    Physical Exam:  There were no vitals filed for this visit. There is no height or weight on file to calculate BMI. Wt Readings from Last 3 Encounters:  06/06/21 112 lb 3.2 oz (50.9 kg)  02/14/21 114 lb 6.4 oz (51.9 kg)  09/27/20 115 lb 9.6 oz (52.4 kg)    Physical Exam Vitals and nursing note reviewed.  Constitutional:      Appearance: Normal appearance.  Cardiovascular:     Rate and Rhythm: Normal rate and regular rhythm.  Pulmonary:     Effort: Pulmonary effort is normal.     Breath sounds: Normal breath sounds.  Musculoskeletal:     Cervical back: Normal range of motion.  Skin:    General: Skin is warm and dry.  Neurological:     General: No focal deficit present.     Mental Status: She is alert and oriented to person, place, and time.     Labs reviewed: Basic Metabolic Panel: Recent Labs    01/29/21 0853  NA 139  K 4.2  CL 101  CO2 30  GLUCOSE 74  BUN 25  CREATININE 1.25*  CALCIUM 9.6   Liver Function Tests: Recent Labs    01/29/21 0853  AST 20  ALT 12  BILITOT 0.5  PROT 6.6   No results for input(s): "LIPASE", "AMYLASE" in the last 8760 hours. No results for  input(s): "AMMONIA" in the last 8760 hours. CBC: No results for input(s): "WBC", "NEUTROABS", "HGB", "HCT", "MCV", "PLT" in the last 8760 hours. Lipid Panel: Recent Labs    01/29/21 0853  CHOL 118  HDL 49*  LDLCALC 51  TRIG 300  CHOLHDL 2.4   TSH: No results for input(s): "TSH" in the last 8760 hours. A1C: Lab Results  Component Value Date   HGBA1C 5.7 (H) 07/25/2014     Assessment/Plan  1. Primary hypertension Blood pressure today 144/76 on metoprolol.  Continue same  2. Hyperlipidemia LDL goal <70 Last LDL was 51 with HDL 49 and total cholesterol of 118  3. Chronic midline low back pain without sciatica Back pain has not changed.  She has not seen any other provider recently about the back pain good but continues with her  hydrocodone about 1/day  4. Stage 3b chronic kidney disease ([place]) About 1 year ago creatinine was 1.25 and GFR 43.  We will reassess with labs tomorrow  5. Sensorineural hearing loss (SNHL) of both ears She is hard of hearing and has a new hearing aid but failed to see the hearing aid clinic folks here today.  Encouraged her to reschedule   Alain Honey, MD [place] [phone]

## 2021-12-06 ENCOUNTER — Other Ambulatory Visit: Payer: Medicare Other

## 2021-12-06 DIAGNOSIS — E785 Hyperlipidemia, unspecified: Secondary | ICD-10-CM

## 2021-12-06 DIAGNOSIS — I10 Essential (primary) hypertension: Secondary | ICD-10-CM

## 2021-12-06 LAB — COMPLETE METABOLIC PANEL WITH GFR
AG Ratio: 1.6 (calc) (ref 1.0–2.5)
ALT: 6 U/L (ref 6–29)
AST: 13 U/L (ref 10–35)
Albumin: 3.9 g/dL (ref 3.6–5.1)
Alkaline phosphatase (APISO): 63 U/L (ref 37–153)
BUN/Creatinine Ratio: 23 (calc) — ABNORMAL HIGH (ref 6–22)
BUN: 31 mg/dL — ABNORMAL HIGH (ref 7–25)
CO2: 29 mmol/L (ref 20–32)
Calcium: 9.3 mg/dL (ref 8.6–10.4)
Chloride: 102 mmol/L (ref 98–110)
Creat: 1.34 mg/dL — ABNORMAL HIGH (ref 0.60–0.95)
Globulin: 2.4 g/dL (calc) (ref 1.9–3.7)
Glucose, Bld: 85 mg/dL (ref 65–99)
Potassium: 4.3 mmol/L (ref 3.5–5.3)
Sodium: 138 mmol/L (ref 135–146)
Total Bilirubin: 0.6 mg/dL (ref 0.2–1.2)
Total Protein: 6.3 g/dL (ref 6.1–8.1)
eGFR: 37 mL/min/{1.73_m2} — ABNORMAL LOW (ref >=60)

## 2021-12-06 LAB — EXTRA LAV TOP TUBE

## 2021-12-06 LAB — LIPID PANEL
Cholesterol: 154 mg/dL (ref <200)
HDL: 51 mg/dL (ref >=50)
LDL Cholesterol (Calc): 86 mg/dL (calc)
Non-HDL Cholesterol (Calc): 103 mg/dL (calc) (ref <130)
Total CHOL/HDL Ratio: 3 (calc) (ref <5.0)
Triglycerides: 77 mg/dL (ref <150)

## 2022-01-17 ENCOUNTER — Non-Acute Institutional Stay: Payer: Medicare Other | Admitting: Nurse Practitioner

## 2022-01-17 ENCOUNTER — Encounter: Payer: Self-pay | Admitting: Nurse Practitioner

## 2022-01-17 VITALS — BP 130/80 | HR 97 | Temp 97.5°F | Resp 16 | Ht 65.0 in | Wt 106.0 lb

## 2022-01-17 DIAGNOSIS — R4589 Other symptoms and signs involving emotional state: Secondary | ICD-10-CM

## 2022-01-17 DIAGNOSIS — E785 Hyperlipidemia, unspecified: Secondary | ICD-10-CM

## 2022-01-17 DIAGNOSIS — M545 Low back pain, unspecified: Secondary | ICD-10-CM

## 2022-01-17 DIAGNOSIS — G8929 Other chronic pain: Secondary | ICD-10-CM

## 2022-01-17 DIAGNOSIS — N1832 Chronic kidney disease, stage 3b: Secondary | ICD-10-CM | POA: Diagnosis not present

## 2022-01-17 DIAGNOSIS — I10 Essential (primary) hypertension: Secondary | ICD-10-CM

## 2022-01-17 DIAGNOSIS — K5909 Other constipation: Secondary | ICD-10-CM

## 2022-01-17 NOTE — Assessment & Plan Note (Signed)
stable, takes Colace prn

## 2022-01-17 NOTE — Assessment & Plan Note (Signed)
blood pressure is controlled on Metoprolol

## 2022-01-17 NOTE — Progress Notes (Signed)
Location:   Clinic [facility]   Place of Service:  Clinic (12) Provider: Chipper Oman NP  Code Status: DNR Goals of Care: [place]    01/17/2022    8:49 AM  Advanced Directives  Does Patient Have a Medical Advance Directive? Yes  Type of Estate agent of [place];Living will  Does patient want to make changes to medical advance directive? No - Patient declined  Copy of Healthcare Power of Attorney in Chart? Yes - validated most recent copy scanned in chart (See row information)     Chief Complaint  Patient presents with   Medical Management of Chronic Issues    Patient is here for follow up for chronic conditions   Quality Metric Gaps    Patient is due for AWV, and Dexa scan Discuss need for updated vaccines    HPI: Patient is a 88 y.o. female seen today for medical management of chronic diseases.       HTN, blood pressure is controlled on Metoprolol             CKD Bun/creat 31/1.23  12/06/21             Hyperlipidemia, stable on Crestor 5mg  qd, LDL 86 12/06/21             OA pain, lower back, f/u ortho,  stable, on Norco              Constipation, stable, takes Colace prn    Past Medical History:  Diagnosis Date   Acute upper respiratory infections of unspecified site 2013   Cellulitis and abscess of hand, except fingers and thumb 10/04/2010   Degeneration of intervertebral disc, site unspecified 2000   Diaphragmatic hernia without mention of obstruction or gangrene 08/15/1998   Diverticulosis of colon (without mention of hemorrhage) 2000   Hearing loss 09/28/2015   Hyperlipidemia 2007   Hypertension 2007   Impacted cerumen    Loss of weight 04/25/2011   Myalgia and myositis, unspecified 01/07/2007   Nontoxic uninodular goiter 08/15/1998   Osteoarthrosis, unspecified whether generalized or localized, unspecified site 07/22/2000   Osteoporosis 2000   Other drug allergy(995.27) 04/04/2011   Pain in limb 2012   Personal history of fall 2012   Pulmonary  hypertension (HCC) 07/25/14   Mild-moderate   Spinal stenosis, unspecified region other than cervical 2002   Sprain of ankle, left    Supraventricular premature beats 10/2006   Tricuspid regurgitation 07/25/14   Mild-moderate   Unspecified late effects of cerebrovascular disease 01/01/2006   Urinary tract infection, site not specified    Urine frequency 01/20/2014   Urticaria, unspecified 11/01/2008   Vitamin D deficiency 04/25/2011    Past Surgical History:  Procedure Laterality Date   BACK SURGERY  1990   HNP L3-4 Dr. 04/27/2011   CATARACT EXTRACTION W/ INTRAOCULAR LENS IMPLANT Right 05/19/2014   Dr. 07/19/2014   COLONOSCOPY  1992   acute segmental colitis Dr. Dione Booze   FACIAL COSMETIC SURGERY  1995   Dr. Russella Dar   IR KYPHO LUMBAR INC FX REDUCE BONE BX UNI/BIL CANNULATION INC/IMAGING  04/18/2020   LARYNGOSCOPY  1987   and biopsy  Dr. 06/16/2020    Allergies  Allergen Reactions   Doxycycline Itching   Lipitor [Atorvastatin] Other (See Comments)    PAIN IN LEGS   Pantoprazole Itching   Pepcid [Famotidine] Itching    Allergies as of 01/17/2022       Reactions   Doxycycline Itching   Lipitor [atorvastatin]  Other (See Comments)   PAIN IN LEGS   Pantoprazole Itching   Pepcid [famotidine] Itching        Medication List        Accurate as of January 17, 2022 11:59 PM. If you have any questions, ask your nurse or doctor.          cholecalciferol 1000 units tablet Commonly known as: VITAMIN D Take 1,000 Units by mouth daily.   docusate sodium 100 MG capsule Commonly known as: COLACE Take 100 mg by mouth 2 (two) times daily as needed for mild constipation.   HYDROcodone-acetaminophen 5-325 MG tablet Commonly known as: NORCO/VICODIN Take 1 tablet by mouth 3 (three) times daily as needed for moderate pain.   Magnesium 250 MG Tabs Take 250 mg by mouth daily at 2 PM.   metoprolol succinate 25 MG 24 hr tablet Commonly known as: TOPROL-XL TAKE 1 TABLET ONCE DAILY.    rosuvastatin 5 MG tablet Commonly known as: CRESTOR TAKE ONE TABLET BY MOUTH DAILY TO LOWER CHOLESTEROL        Review of Systems:  Review of Systems  Constitutional:  Positive for unexpected weight change. Negative for activity change and fever.       Gradual weight loss.   HENT:  Positive for hearing loss. Negative for congestion and voice change.   Eyes:  Positive for visual disturbance.  Respiratory:  Negative for cough, shortness of breath and wheezing.   Cardiovascular:  Negative for leg swelling.  Gastrointestinal:  Negative for abdominal pain, constipation, nausea and vomiting.  Genitourinary:  Negative for dysuria, hematuria and urgency.       2x/night  Musculoskeletal:  Positive for back pain and gait problem.       Lower back pain, travels to legs sometimes, f/u Ortho, improved.   Skin:  Negative for color change.  Neurological:  Negative for speech difficulty, weakness and light-headedness.       Memory lapses.   Psychiatric/Behavioral:  Negative for behavioral problems and sleep disturbance. The patient is not nervous/anxious.        The patient's son reported the patient expressed loneliness over the phone at times.     Health Maintenance  Topic Date Due   Medicare Annual Wellness (AWV)  Never done   DEXA SCAN  Never done   Pneumonia Vaccine 67+ Years old (3 - PPSV23 or PCV20) 12/10/2019   TETANUS/TDAP  04/24/2021   COVID-19 Vaccine (4 - Moderna series) 05/11/2022   INFLUENZA VACCINE  Completed   Zoster Vaccines- Shingrix  Completed   HPV VACCINES  Aged Out    Physical Exam: Vitals:   01/17/22 1302  BP: 130/80  Pulse: 97  Resp: 16  Temp: (!) 97.5 F (36.4 C)  SpO2: 96%  Weight: 106 lb (48.1 kg)  Height: 5\' 5"  (1.651 m)   Body mass index is 17.64 kg/m. Physical Exam Vitals and nursing note reviewed.  Constitutional:      Appearance: Normal appearance.  HENT:     Head: Normocephalic and atraumatic.     Mouth/Throat:     Mouth: Mucous membranes  are moist.  Eyes:     Extraocular Movements: Extraocular movements intact.     Conjunctiva/sclera: Conjunctivae normal.     Pupils: Pupils are equal, round, and reactive to light.  Cardiovascular:     Rate and Rhythm: Normal rate and regular rhythm.     Heart sounds: No murmur heard. Pulmonary:     Breath sounds: No rales.  Comments: Occasionally wheezes at end of the expiration. Abdominal:     General: Bowel sounds are normal.     Palpations: Abdomen is soft.     Tenderness: There is no abdominal tenderness.  Musculoskeletal:     Cervical back: Normal range of motion and neck supple.     Right lower leg: No edema.     Left lower leg: No edema.     Comments: Ambulates with walker.   Skin:    General: Skin is warm and dry.  Neurological:     General: No focal deficit present.     Mental Status: She is alert and oriented to person, place, and time. Mental status is at baseline.     Motor: No weakness.     Coordination: Coordination normal.     Gait: Gait abnormal.  Psychiatric:        Mood and Affect: Mood normal.        Behavior: Behavior normal.        Thought Content: Thought content normal.        Judgment: Judgment normal.     Labs reviewed: Basic Metabolic Panel: Recent Labs    01/29/21 0853 12/06/21 0750  NA 139 138  K 4.2 4.3  CL 101 102  CO2 30 29  GLUCOSE 74 85  BUN 25 31*  CREATININE 1.25* 1.34*  CALCIUM 9.6 9.3   Liver Function Tests: Recent Labs    01/29/21 0853 12/06/21 0750  AST 20 13  ALT 12 6  BILITOT 0.5 0.6  PROT 6.6 6.3   No results for input(s): "LIPASE", "AMYLASE" in the last 8760 hours. No results for input(s): "AMMONIA" in the last 8760 hours. CBC: No results for input(s): "WBC", "NEUTROABS", "HGB", "HCT", "MCV", "PLT" in the last 8760 hours. Lipid Panel: Recent Labs    01/29/21 0853 12/06/21 0750  CHOL 118 154  HDL 49* 51  LDLCALC 51 86  TRIG 101 77  CHOLHDL 2.4 3.0   Lab Results  Component Value Date   HGBA1C 5.7  (H) 07/25/2014    Procedures since last visit: No results found.  Assessment/Plan  Hypertension blood pressure is controlled on Metoprolol  CKD (chronic kidney disease) stage 3, GFR 30-59 ml/min (HCC) Bun/creat 31/1.23  12/06/21  Hyperlipidemia LDL goal <70 stable on Crestor 5mg  qd, LDL 86 12/06/21  Chronic lower back pain lower back, f/u ortho,  stable, on Norco   Chronic constipation  stable, takes Colace prn  Feeling lonely Will PHQ9 MMSE next AWV in 2 weeks.    Labs/tests ordered:  none  Next appt:  3 months, AWV 2 weeks.

## 2022-01-17 NOTE — Assessment & Plan Note (Signed)
Will PHQ9 MMSE next AWV in 2 weeks.

## 2022-01-17 NOTE — Assessment & Plan Note (Signed)
lower back, f/u ortho,  stable, on Norco

## 2022-01-17 NOTE — Assessment & Plan Note (Signed)
stable on Crestor 5mg  qd, LDL 86 12/06/21

## 2022-01-17 NOTE — Assessment & Plan Note (Signed)
Bun/creat 31/1.23  12/06/21

## 2022-01-18 ENCOUNTER — Encounter: Payer: Self-pay | Admitting: Nurse Practitioner

## 2022-02-05 ENCOUNTER — Telehealth: Payer: Self-pay

## 2022-02-05 NOTE — Telephone Encounter (Signed)
Patient's son [name] called concerning the bone density order and states that pt would like to cancel out the order.

## 2022-02-14 ENCOUNTER — Encounter: Payer: Medicare Other | Admitting: Nurse Practitioner

## 2022-02-14 ENCOUNTER — Encounter: Payer: Self-pay | Admitting: Nurse Practitioner

## 2022-02-15 NOTE — Progress Notes (Signed)
This encounter was created in error - please disregard.

## 2022-04-18 ENCOUNTER — Encounter: Payer: Medicare Other | Admitting: Nurse Practitioner

## 2022-05-02 ENCOUNTER — Ambulatory Visit (INDEPENDENT_AMBULATORY_CARE_PROVIDER_SITE_OTHER): Payer: Medicare Other | Admitting: Adult Health

## 2022-05-02 ENCOUNTER — Encounter: Payer: Self-pay | Admitting: Adult Health

## 2022-05-02 ENCOUNTER — Encounter: Payer: Medicare Other | Admitting: Nurse Practitioner

## 2022-05-02 ENCOUNTER — Encounter: Payer: Medicare Other | Admitting: Adult Health

## 2022-05-02 VITALS — BP 128/68 | HR 94 | Temp 97.8°F | Resp 18 | Ht 65.0 in | Wt 108.0 lb

## 2022-05-02 DIAGNOSIS — K5909 Other constipation: Secondary | ICD-10-CM

## 2022-05-02 DIAGNOSIS — D509 Iron deficiency anemia, unspecified: Secondary | ICD-10-CM

## 2022-05-02 DIAGNOSIS — H903 Sensorineural hearing loss, bilateral: Secondary | ICD-10-CM

## 2022-05-02 DIAGNOSIS — I10 Essential (primary) hypertension: Secondary | ICD-10-CM

## 2022-05-02 DIAGNOSIS — Z23 Encounter for immunization: Secondary | ICD-10-CM

## 2022-05-02 DIAGNOSIS — G8929 Other chronic pain: Secondary | ICD-10-CM

## 2022-05-02 DIAGNOSIS — N1832 Chronic kidney disease, stage 3b: Secondary | ICD-10-CM

## 2022-05-02 DIAGNOSIS — M545 Low back pain, unspecified: Secondary | ICD-10-CM

## 2022-05-02 NOTE — Patient Instructions (Signed)
You are doing great!  Tdap vaccine given  Follow up in 3 months  Labs will done Tuesday at [facility] 05/07/22

## 2022-05-02 NOTE — Progress Notes (Signed)
Location:  [facility]  POS: Clinic  Provider: Royal Hawthorn, ANP   Goals of Care:     05/02/2022   10:53 AM  Advanced Directives  Does Patient Have a Medical Advance Directive? Yes  Type of Paramedic of [place];Living will  Does patient want to make changes to medical advance directive? No - Patient declined  Copy of [place] in Chart? Yes - validated most recent copy scanned in chart (See row information)     Chief Complaint  Patient presents with   Medical Management of Chronic Issues    Patient is here for a follow up for chronic conditions    Immunizations    Patient is due for updated covid vaccine and Tdap   Quality Metric Gaps    Patient due for AWV    HPI: Patient is a 89 y.o. female seen today for medical management of chronic diseases.    Resides in [facility] [place]   No acute complaints  BP controlled on metoprolol  CKD III  Lab Results  Component Value Date   BUN 31 (H) 12/06/2021   Lab Results  Component Value Date   CREATININE 1.34 (H) 12/06/2021   Has hx of osteoporosis with kyphoplasty , decined further BMD  HLD on crestor Lab Results  Component Value Date   CHOL 154 12/06/2021   HDL 51 12/06/2021   LDLCALC 86 12/06/2021   TRIG 77 12/06/2021   CHOLHDL 3.0 12/06/2021  Denies constipation  Denies incontinence Has hearing loss Uses walker for ambulation,  No recent falls   Past Medical History:  Diagnosis Date   Acute upper respiratory infections of unspecified site 2013   Cellulitis and abscess of hand, except fingers and thumb 10/04/2010   Degeneration of intervertebral disc, site unspecified 2000   Diaphragmatic hernia without mention of obstruction or gangrene 08/15/1998   Diverticulosis of colon (without mention of hemorrhage) 2000   Hearing loss 09/28/2015   Hyperlipidemia 2007   Hypertension 2007   Impacted cerumen    Loss of weight 04/25/2011   Myalgia and myositis, unspecified  01/07/2007   Nontoxic uninodular goiter 08/15/1998   Osteoarthrosis, unspecified whether generalized or localized, unspecified site 07/22/2000   Osteoporosis 2000   Other drug allergy(995.27) 04/04/2011   Pain in limb 2012   Personal history of fall 2012   Pulmonary hypertension ([place]) 07/25/14   Mild-moderate   Spinal stenosis, unspecified region other than cervical 2002   Sprain of ankle, left    Supraventricular premature beats 10/2006   Tricuspid regurgitation 07/25/14   Mild-moderate   Unspecified late effects of cerebrovascular disease 01/01/2006   Urinary tract infection, site not specified    Urine frequency 01/20/2014   Urticaria, unspecified 11/01/2008   Vitamin D deficiency 04/25/2011    Past Surgical History:  Procedure Laterality Date   BACK SURGERY  1990   HNP L3-4 Dr. Cheri Rous   CATARACT EXTRACTION W/ INTRAOCULAR LENS IMPLANT Right 05/19/2014   Dr. Katy Fitch   COLONOSCOPY  1992   acute segmental colitis Dr. Fuller Plan   FACIAL COSMETIC SURGERY  1995   Dr. Jeanella Flattery   IR KYPHO LUMBAR INC FX REDUCE BONE BX UNI/BIL CANNULATION INC/IMAGING  04/18/2020   LARYNGOSCOPY  1987   and biopsy  Dr. Ernesto Rutherford    Allergies  Allergen Reactions   Doxycycline Itching   Lipitor [Atorvastatin] Other (See Comments)    PAIN IN LEGS   Pantoprazole Itching   Pepcid [Famotidine] Itching  Outpatient Encounter Medications as of 05/02/2022  Medication Sig   cholecalciferol (VITAMIN D) 1000 UNITS tablet Take 1,000 Units by mouth daily.    docusate sodium (COLACE) 100 MG capsule Take 100 mg by mouth 2 (two) times daily as needed for mild constipation.   HYDROcodone-acetaminophen (NORCO/VICODIN) 5-325 MG per tablet Take 1 tablet by mouth 3 (three) times daily as needed for moderate pain.   Magnesium 250 MG TABS Take 250 mg by mouth daily at 2 PM.    metoprolol succinate (TOPROL-XL) 25 MG 24 hr tablet TAKE 1 TABLET ONCE DAILY.   rosuvastatin (CRESTOR) 5 MG tablet TAKE ONE TABLET BY MOUTH DAILY TO  LOWER CHOLESTEROL   No facility-administered encounter medications on file as of 05/02/2022.    Review of Systems:  Review of Systems  Constitutional:  Negative for activity change, appetite change, chills, diaphoresis, fatigue, fever and unexpected weight change.  HENT:  Negative for congestion.   Respiratory:  Negative for cough, shortness of breath and wheezing.   Cardiovascular:  Negative for chest pain, palpitations and leg swelling.  Gastrointestinal:  Negative for abdominal distention, abdominal pain, constipation and diarrhea.  Genitourinary:  Negative for difficulty urinating and dysuria.  Musculoskeletal:  Positive for back pain and gait problem. Negative for arthralgias, joint swelling and myalgias.  Neurological:  Negative for dizziness, tremors, seizures, syncope, facial asymmetry, speech difficulty, weakness, light-headedness, numbness and headaches.  Psychiatric/Behavioral:  Negative for agitation, behavioral problems and confusion.     Health Maintenance  Topic Date Due   Medicare Annual Wellness (AWV)  Never done   DTaP/Tdap/Td (2 - Tdap) 04/24/2021   COVID-19 Vaccine (4 - 2023-24 season) 03/07/2022   DEXA SCAN  02/06/2023 (Originally 09/02/1991)   Pneumonia Vaccine 70+ Years old (3 of 3 - PPSV23 or PCV20) 12/10/2023   INFLUENZA VACCINE  Completed   Zoster Vaccines- Shingrix  Completed   HPV VACCINES  Aged Out    Physical Exam: Vitals:   05/02/22 1055  BP: 128/68  Pulse: 94  Resp: 18  Temp: 97.8 F (36.6 C)  SpO2: 97%  Weight: 108 lb (49 kg)  Height: 5' 5"$  (1.651 m)   Body mass index is 17.97 kg/m. Weight: 108 lb (49 kg)  Wt Readings from Last 3 Encounters:  05/02/22 108 lb (49 kg)  01/17/22 106 lb (48.1 kg)  12/05/21 110 lb 9.6 oz (50.2 kg)    Physical Exam Vitals and nursing note reviewed.  Constitutional:      General: She is not in acute distress.    Appearance: She is not diaphoretic.  HENT:     Head: Normocephalic and atraumatic.  Neck:      Vascular: No JVD.  Cardiovascular:     Rate and Rhythm: Normal rate and regular rhythm.     Heart sounds: No murmur heard. Pulmonary:     Effort: Pulmonary effort is normal. No respiratory distress.     Breath sounds: Wheezing present.  Abdominal:     General: Bowel sounds are normal. There is no distension.     Palpations: Abdomen is soft.     Tenderness: There is no abdominal tenderness.  Musculoskeletal:     Cervical back: No rigidity or tenderness.  Lymphadenopathy:     Cervical: No cervical adenopathy.  Skin:    General: Skin is warm and dry.  Neurological:     Mental Status: She is alert and oriented to person, place, and time.     Labs reviewed: Basic Metabolic Panel: Recent Labs  12/06/21 0750  NA 138  K 4.3  CL 102  CO2 29  GLUCOSE 85  BUN 31*  CREATININE 1.34*  CALCIUM 9.3   Liver Function Tests: Recent Labs    12/06/21 0750  AST 13  ALT 6  BILITOT 0.6  PROT 6.3   No results for input(s): "LIPASE", "AMYLASE" in the last 8760 hours. No results for input(s): "AMMONIA" in the last 8760 hours. CBC: No results for input(s): "WBC", "NEUTROABS", "HGB", "HCT", "MCV", "PLT" in the last 8760 hours. Lipid Panel: Recent Labs    12/06/21 0750  CHOL 154  HDL 51  LDLCALC 86  TRIG 77  CHOLHDL 3.0   Lab Results  Component Value Date   HGBA1C 5.7 (H) 07/25/2014    Procedures since last visit: No results found.  Assessment/Plan  1. Primary hypertension Controlled Continue metoprolol and magnesium - Complete Metabolic Panel with eGFR  2. Iron deficiency anemia, unspecified iron deficiency anemia type Lab Results  Component Value Date   HGB 12.5 04/18/2020   - CBC (no diff)  3. Need for Tdap vaccination  - Tdap vaccine greater than or equal to 7yo IM  4. Chronic constipation Colace prn  5. Sensorineural hearing loss (SNHL) of both ears noted  6. Stage 3b chronic kidney disease ([place]) Continue to periodically monitor BMP and avoid  nephrotoxic agents  7. Chronic low back pain, unspecified back pain laterality, unspecified whether sciatica present Followed by Dr Nelva Bush Using norco prn  Labs/tests ordered:  * No order type specified * Next appt:  F/U with Man xie mast in 3 months    Total time 41mn:  time greater than 50% of total time spent doing pt counseling and coordination of care

## 2022-05-07 LAB — COMPLETE METABOLIC PANEL WITH GFR
AG Ratio: 1.6 (calc) (ref 1.0–2.5)
ALT: 6 U/L (ref 6–29)
AST: 15 U/L (ref 10–35)
Albumin: 4.1 g/dL (ref 3.6–5.1)
Alkaline phosphatase (APISO): 56 U/L (ref 37–153)
BUN/Creatinine Ratio: 22 (calc) (ref 6–22)
BUN: 27 mg/dL — ABNORMAL HIGH (ref 7–25)
CO2: 29 mmol/L (ref 20–32)
Calcium: 9.6 mg/dL (ref 8.6–10.4)
Chloride: 103 mmol/L (ref 98–110)
Creat: 1.25 mg/dL — ABNORMAL HIGH (ref 0.60–0.95)
Globulin: 2.5 g/dL (calc) (ref 1.9–3.7)
Glucose, Bld: 84 mg/dL (ref 65–99)
Potassium: 4 mmol/L (ref 3.5–5.3)
Sodium: 142 mmol/L (ref 135–146)
Total Bilirubin: 0.7 mg/dL (ref 0.2–1.2)
Total Protein: 6.6 g/dL (ref 6.1–8.1)
eGFR: 40 mL/min/{1.73_m2} — ABNORMAL LOW (ref >=60)

## 2022-05-07 LAB — CBC
HCT: 40.4 % (ref 35.0–45.0)
Hemoglobin: 13.4 g/dL (ref 11.7–15.5)
MCH: 29.1 pg (ref 27.0–33.0)
MCHC: 33.2 g/dL (ref 32.0–36.0)
MCV: 87.8 fL (ref 80.0–100.0)
MPV: 10.4 fL (ref 7.5–12.5)
Platelets: 267 10*3/uL (ref 140–400)
RBC: 4.6 10*6/uL (ref 3.80–5.10)
RDW: 13.9 % (ref 11.0–15.0)
WBC: 7.6 10*3/uL (ref 3.8–10.8)

## 2022-08-28 ENCOUNTER — Encounter: Payer: Self-pay | Admitting: Family Medicine

## 2022-08-28 ENCOUNTER — Non-Acute Institutional Stay: Payer: Medicare Other | Admitting: Family Medicine

## 2022-08-28 VITALS — BP 126/74 | HR 89 | Temp 97.6°F | Ht 65.0 in | Wt 106.6 lb

## 2022-08-28 DIAGNOSIS — I10 Essential (primary) hypertension: Secondary | ICD-10-CM | POA: Diagnosis not present

## 2022-08-28 DIAGNOSIS — D509 Iron deficiency anemia, unspecified: Secondary | ICD-10-CM | POA: Diagnosis not present

## 2022-08-28 DIAGNOSIS — H903 Sensorineural hearing loss, bilateral: Secondary | ICD-10-CM

## 2022-08-28 NOTE — Progress Notes (Signed)
Provider:  Jacalyn Lefevre, MD  Careteam: Patient Care Team: Frederica Kuster, MD as PCP - General (Family Medicine) Chilton Si, MD as PCP - Cardiology (Cardiology) Myrtis Hopping, MD (Inactive) as Consulting Physician (Neurosurgery) Keturah Barre, MD as Consulting Physician (Otolaryngology) Meryl Dare, MD as Consulting Physician (Gastroenterology) Elliot Cousin, OD as Consulting Physician (Optometry) Sheran Luz, MD as Consulting Physician (Physical Medicine and Rehabilitation) Ranee Gosselin, MD as Consulting Physician (Orthopedic Surgery) [place], [facility] Chilton Si, MD as Attending Physician (Cardiology) Sherrie George, MD as Consulting Physician (Ophthalmology) Mast, Man X, NP as Nurse Practitioner (Internal Medicine)  PLACE OF SERVICE:  [facility]  Advanced Directive information    Allergies  Allergen Reactions   Doxycycline Itching   Lipitor [Atorvastatin] Other (See Comments)    PAIN IN LEGS   Pantoprazole Itching   Pepcid [Famotidine] Itching    Chief Complaint  Patient presents with   Medical Management of Chronic Issues    Patient presents today for a 7 month follow-up   Quality Metric Gaps    AWV, TDAP, COVID#4     HPI: Patient is a 89 y.o. female patient presents today for 65-month follow-up.  She is accompanied by her son who is visiting here from [place].  She is not aware of any reason she is here but according to the son there is some concern with her other son and his wife about her memory.  The son who accompanies her today has not noted any particular issues with memory. She lives in independent apartment.  She has friends there and eats with her regular group at meals.  She continues to have back pain and sees orthopedics for that.  I have given her hydrocodone for pain.  No issues with constipation; however, she does take stool softener  Review of Systems:  Review of Systems  Constitutional: Negative.    Respiratory: Negative.    Cardiovascular: Negative.   Genitourinary: Negative.   Musculoskeletal:  Positive for back pain.  Skin: Negative.   Neurological: Negative.   Psychiatric/Behavioral: Negative.      Past Medical History:  Diagnosis Date   Acute upper respiratory infections of unspecified site 2013   Cellulitis and abscess of hand, except fingers and thumb 10/04/2010   Degeneration of intervertebral disc, site unspecified 2000   Diaphragmatic hernia without mention of obstruction or gangrene 08/15/1998   Diverticulosis of colon (without mention of hemorrhage) 2000   Hearing loss 09/28/2015   Hyperlipidemia 2007   Hypertension 2007   Impacted cerumen    Loss of weight 04/25/2011   Myalgia and myositis, unspecified 01/07/2007   Nontoxic uninodular goiter 08/15/1998   Osteoarthrosis, unspecified whether generalized or localized, unspecified site 07/22/2000   Osteoporosis 2000   Other drug allergy(995.27) 04/04/2011   Pain in limb 2012   Personal history of fall 2012   Pulmonary hypertension (HCC) 07/25/14   Mild-moderate   Spinal stenosis, unspecified region other than cervical 2002   Sprain of ankle, left    Supraventricular premature beats 10/2006   Tricuspid regurgitation 07/25/14   Mild-moderate   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 Lynnette Caffey   CATARACT EXTRACTION W/ INTRAOCULAR LENS IMPLANT Right 05/19/2014   Dr. Dione Booze  COLONOSCOPY  1992   acute segmental colitis Dr. Russella Dar   FACIAL COSMETIC SURGERY  1995   Dr. Charolotte Eke   IR KYPHO LUMBAR INC FX REDUCE BONE BX UNI/BIL CANNULATION INC/IMAGING  04/18/2020   LARYNGOSCOPY  1987   and biopsy  Dr. Haroldine Laws   Social History:   reports that she has never smoked. She  has never used smokeless tobacco. She reports that she does not drink alcohol and does not use drugs.  Family History  Problem Relation Age of Onset   Stroke Mother    Stroke Father    Hypertension Brother    Rheum arthritis Maternal Grandmother    Pancreatic cancer Sister     Medications: Patient's Medications  New Prescriptions   No medications on file  Previous Medications   CHOLECALCIFEROL (VITAMIN D) 1000 UNITS TABLET    Take 1,000 Units by mouth daily.    DOCUSATE SODIUM (COLACE) 100 MG CAPSULE    Take 100 mg by mouth 2 (two) times daily as needed for mild constipation.   HYDROCODONE-ACETAMINOPHEN (NORCO/VICODIN) 5-325 MG PER TABLET    Take 1 tablet by mouth 3 (three) times daily as needed for moderate pain.   MAGNESIUM 250 MG TABS    Take 250 mg by mouth daily at 2 PM.    METOPROLOL SUCCINATE (TOPROL-XL) 25 MG 24 HR TABLET    TAKE 1 TABLET ONCE DAILY.   ROSUVASTATIN (CRESTOR) 5 MG TABLET    TAKE ONE TABLET BY MOUTH DAILY TO LOWER CHOLESTEROL  Modified Medications   No medications on file  Discontinued Medications   No medications on file    Physical Exam:  Vitals:   08/28/22 1335  BP: 126/74  Pulse: 89  Temp: 97.6 F (36.4 C)  SpO2: 99%  Weight: 106 lb 9.6 oz (48.4 kg)  Height: 5\' 5"  (1.651 m)   Body mass index is 17.74 kg/m. Wt Readings from Last 3 Encounters:  08/28/22 106 lb 9.6 oz (48.4 kg)  05/02/22 108 lb (49 kg)  01/17/22 106 lb (48.1 kg)    Physical Exam Vitals and nursing note reviewed.  Constitutional:      Appearance: Normal appearance.  Cardiovascular:     Rate and Rhythm: Normal rate and regular rhythm.  Pulmonary:     Effort: Pulmonary effort is normal.     Breath sounds: Normal breath sounds.  Neurological:     General: No focal deficit present.     Mental Status: She is alert and oriented to person, place, and time.     Labs reviewed: Basic Metabolic Panel: Recent Labs    12/06/21 0750 05/07/22 0630  NA 138 142  K 4.3 4.0   CL 102 103  CO2 29 29  GLUCOSE 85 84  BUN 31* 27*  CREATININE 1.34* 1.25*  CALCIUM 9.3 9.6   Liver Function Tests: Recent Labs    12/06/21 0750 05/07/22 0630  AST 13 15  ALT 6 6  BILITOT 0.6 0.7  PROT 6.3 6.6   No results for input(s): "LIPASE", "AMYLASE" in the last 8760 hours. No results for input(s): "AMMONIA" in the last 8760 hours. CBC: Recent Labs    05/07/22 0630  WBC 7.6  HGB 13.4  HCT 40.4  MCV 87.8  PLT 267   Lipid Panel: Recent Labs    12/06/21 0750  CHOL 154  HDL 51  LDLCALC 86  TRIG 77  CHOLHDL 3.0   TSH: No results for input(s): "TSH" in the last 8760 hours. A1C: Lab  Results  Component Value Date   HGBA1C 5.7 (H) 07/25/2014     Assessment/Plan  1. Sensorineural hearing loss (SNHL) of both ears Patient has hearing aids but have to repeat questions multiple times  2. Primary hypertension Blood pressure is doing good today at 126/74.  Continue metoprolol  3. Iron deficiency anemia, unspecified iron deficiency anemia type CBC done 2 months ago hemoglobin is normal at 13.4  Has meeting tomorrow with Child psychotherapist and facility nurse.  I have requested a MMSE or slums test to evaluate memory.  Last test done was 6 years ago  Jacalyn Lefevre, MD [facility] [phone]

## 2022-10-03 ENCOUNTER — Other Ambulatory Visit: Payer: Self-pay | Admitting: Family

## 2022-10-03 DIAGNOSIS — E785 Hyperlipidemia, unspecified: Secondary | ICD-10-CM

## 2022-10-03 NOTE — Telephone Encounter (Signed)
High Risk Warning Populated when attempting to refill, I will send to Provider for further review 

## 2022-10-18 ENCOUNTER — Encounter: Payer: Self-pay | Admitting: Family Medicine

## 2022-10-21 ENCOUNTER — Encounter: Payer: Self-pay | Admitting: Family Medicine

## 2022-10-22 ENCOUNTER — Encounter: Payer: Self-pay | Admitting: Family Medicine

## 2022-10-25 ENCOUNTER — Non-Acute Institutional Stay: Payer: Medicare Other | Admitting: Nurse Practitioner

## 2022-10-25 ENCOUNTER — Encounter: Payer: Self-pay | Admitting: Nurse Practitioner

## 2022-10-25 DIAGNOSIS — I10 Essential (primary) hypertension: Secondary | ICD-10-CM | POA: Diagnosis not present

## 2022-10-25 DIAGNOSIS — K5909 Other constipation: Secondary | ICD-10-CM

## 2022-10-25 DIAGNOSIS — R413 Other amnesia: Secondary | ICD-10-CM

## 2022-10-25 DIAGNOSIS — N1832 Chronic kidney disease, stage 3b: Secondary | ICD-10-CM

## 2022-10-25 DIAGNOSIS — M545 Low back pain, unspecified: Secondary | ICD-10-CM | POA: Diagnosis not present

## 2022-10-25 DIAGNOSIS — E785 Hyperlipidemia, unspecified: Secondary | ICD-10-CM | POA: Diagnosis not present

## 2022-10-25 DIAGNOSIS — G8929 Other chronic pain: Secondary | ICD-10-CM

## 2022-10-25 NOTE — Assessment & Plan Note (Signed)
Bun/creat 27/1.25 05/07/22

## 2022-10-25 NOTE — Assessment & Plan Note (Signed)
[facility] for care, update MMSE

## 2022-10-25 NOTE — Assessment & Plan Note (Signed)
blood pressure is controlled on Metoprolol. Bun/creat 27/1.25 05/07/22, update CMP/eGFR, CBC/diff, TSH

## 2022-10-25 NOTE — Assessment & Plan Note (Signed)
lower back, f/u ortho,  stable, on Norco

## 2022-10-25 NOTE — Progress Notes (Unsigned)
Location:  [facility] Room Number: 10/18/2022 Place of Service:  ALF (13) Provider:  Bethani Brugger Johnney Ou, NP  Frederica Kuster, MD  Patient Care Team: Frederica Kuster, MD as PCP - General (Family Medicine) Chilton Si, MD as PCP - Cardiology (Cardiology) Myrtis Hopping, MD (Inactive) as Consulting Physician (Neurosurgery) Keturah Barre, MD as Consulting Physician (Otolaryngology) Meryl Dare, MD as Consulting Physician (Gastroenterology) Elliot Cousin, OD as Consulting Physician (Optometry) Sheran Luz, MD as Consulting Physician (Physical Medicine and Rehabilitation) Ranee Gosselin, MD as Consulting Physician (Orthopedic Surgery) [place] Chilton Si, MD as Attending Physician (Cardiology) Sherrie George, MD as Consulting Physician (Ophthalmology) Allis Quirarte X, NP as Nurse Practitioner (Internal Medicine)  Extended Emergency Contact Information Primary Emergency Contact: Gwenlyn Fudge of [place] Home Phone: [phone] Mobile Phone: [phone] Relation: Son Secondary Emergency Contact: Towanda Malkin [place] of [place] Mobile Phone: [phone] Relation: Other  Code Status:  Full Code  Goals of care: Advanced Directive information    10/25/2022   12:35 PM  Advanced Directives  Does Patient Have a Medical Advance Directive? Yes  Type of Estate agent of [place];Living will  Does patient want to make changes to medical advance directive? No - Patient declined  Copy of Healthcare Power of Attorney in Chart? Yes - validated most recent copy scanned in chart (See row information)     Chief Complaint  Patient presents with   Acute Visit    Patient is being seen for medication review    Immunizations    Patient is due for covid, tdap and flu vaccine    Quality Metric Gaps    Patient needs AWV    HPI:  Pt is a 89 y.o. female seen today for medication  review   HTN, blood pressure is controlled on Metoprolol             CKD Bun/creat 27/1.25 05/07/22             Hyperlipidemia, stable on Crestor 5mg  qd, LDL 86 12/06/21             OA pain, lower back, f/u ortho,  stable, on Norco              Constipation, stable, takes Colace prn  [facility] for care     Past Medical History:  Diagnosis Date   Acute upper respiratory infections of unspecified site 2013   Cellulitis and abscess of hand, except fingers and thumb 10/04/2010   Degeneration of intervertebral disc, site unspecified 2000   Diaphragmatic hernia without mention of obstruction or gangrene 08/15/1998   Diverticulosis of colon (without mention of hemorrhage) 2000   Hearing loss 09/28/2015   Hyperlipidemia 2007   Hypertension 2007   Impacted cerumen    Loss of weight 04/25/2011   Myalgia and myositis, unspecified 01/07/2007   Nontoxic uninodular goiter 08/15/1998   Osteoarthrosis, unspecified whether generalized or localized, unspecified site 07/22/2000   Osteoporosis 2000   Other drug allergy(995.27) 04/04/2011   Pain in limb 2012   Personal history of fall 2012   Pulmonary hypertension (HCC) 07/25/14   Mild-moderate   Spinal stenosis, unspecified region other than cervical 2002   Sprain of ankle, left    Supraventricular premature beats 10/2006   Tricuspid regurgitation 07/25/14   Mild-moderate   Unspecified late effects of cerebrovascular disease 01/01/2006   Urinary tract infection, site  not specified    Urine frequency 01/20/2014   Urticaria, unspecified 11/01/2008   Vitamin D deficiency 04/25/2011   Past Surgical History:  Procedure Laterality Date   BACK SURGERY  1990   HNP L3-4 Dr. Lynnette Caffey   CATARACT EXTRACTION W/ INTRAOCULAR LENS IMPLANT Right 05/19/2014   Dr. Dione Booze   COLONOSCOPY  1992   acute segmental colitis Dr. Russella Dar   FACIAL COSMETIC SURGERY  1995   Dr. Charolotte Eke   IR [facility] LUMBAR INC FX REDUCE BONE BX UNI/BIL CANNULATION INC/IMAGING  04/18/2020    LARYNGOSCOPY  1987   and biopsy  Dr. Haroldine Laws    Allergies  Allergen Reactions   Doxycycline Itching   Lipitor [Atorvastatin] Other (See Comments)    PAIN IN LEGS   Pantoprazole Itching   Pepcid [Famotidine] Itching    Outpatient Encounter Medications as of 10/25/2022  Medication Sig   cholecalciferol (VITAMIN D) 1000 UNITS tablet Take 1,000 Units by mouth daily.    docusate sodium (COLACE) 100 MG capsule Take 100 mg by mouth 2 (two) times daily as needed for mild constipation.   HYDROcodone-acetaminophen (NORCO/VICODIN) 5-325 MG per tablet Take 1 tablet by mouth 3 (three) times daily as needed for moderate pain.   Magnesium 250 MG TABS Take 250 mg by mouth daily at 2 PM.    metoprolol succinate (TOPROL-XL) 25 MG 24 hr tablet TAKE 1 TABLET ONCE DAILY.   rosuvastatin (CRESTOR) 5 MG tablet TAKE ONE TABLET BY MOUTH DAILY TO LOWER CHOLESTEROL   No facility-administered encounter medications on file as of 10/25/2022.    Review of Systems  Constitutional:  Negative for activity change and fever.  HENT:  Positive for hearing loss. Negative for congestion and voice change.   Eyes:  Positive for visual disturbance.  Respiratory:  Negative for cough and shortness of breath.   Cardiovascular:  Negative for leg swelling.  Gastrointestinal:  Negative for abdominal pain, constipation and nausea.  Genitourinary:  Negative for dysuria, hematuria and urgency.       2x/night  Musculoskeletal:  Positive for back pain and gait problem.       Lower back pain, travels to legs sometimes, f/u Ortho, improved.   Skin:  Negative for color change.  Neurological:  Negative for speech difficulty, weakness and light-headedness.       Memory lapses.   Psychiatric/Behavioral:  Negative for behavioral problems and sleep disturbance. The patient is not nervous/anxious.        The patient's son reported the patient expressed loneliness over the phone at times.     Immunization History  Administered Date(s)  Administered   Fluad Quad(high Dose 65+) 12/25/2021   Influenza Whole 12/10/2011, 12/23/2012, 10/26/2017   Influenza, High Dose Seasonal PF 12/18/2016, 12/10/2018   Influenza,inj,quad, With Preservative 11/17/2017   Influenza-Unspecified 12/24/2013, 12/08/2014, 12/21/2015   Moderna Covid-19 Vaccine Bivalent Booster 77yrs & up 01/10/2022   Moderna Sars-Covid-2 Vaccination 03/15/2019, 04/12/2019   Pneumococcal Conjugate-13 12/10/2018   Pneumococcal Polysaccharide-23 03/12/1991   Td 04/25/2011   Zoster Recombinant(Shingrix) 01/30/2018, 05/20/2018   Zoster, Live 09/04/2005   Pertinent  Health Maintenance Due  Topic Date Due   INFLUENZA VACCINE  10/10/2022   DEXA SCAN  02/06/2023 (Originally 09/02/1991)      02/14/2021    3:28 PM 01/17/2022    8:49 AM 02/14/2022    9:16 AM 05/02/2022   10:53 AM 08/28/2022    1:41 PM  Fall Risk  Falls in the past year? 0 0 0 0 0  Was there  an injury with Fall? 0 0 0 0 0  Fall Risk Category Calculator 0 0 0 0 0  Fall Risk Category (Retired) Low Low Low    (RETIRED) Patient Fall Risk Level Low fall risk Low fall risk Low fall risk    Patient at Risk for Falls Due to No Fall Risks No Fall Risks No Fall Risks No Fall Risks No Fall Risks  Fall risk Follow up Falls evaluation completed;Education provided;Falls prevention discussed Falls evaluation completed Falls evaluation completed Falls evaluation completed Falls evaluation completed   Functional Status Survey:    Vitals:   10/25/22 1234  BP: 134/66  Pulse: 61  Resp: 16  Temp: 98 F (36.7 C)  TempSrc: Temporal  SpO2: 96%  Weight: 105 lb 3.2 oz (47.7 kg)  Height: 5\' 5"  (1.651 m)   Body mass index is 17.51 kg/m. Physical Exam Vitals and nursing note reviewed.  Constitutional:      Appearance: Normal appearance.  HENT:     Head: Normocephalic and atraumatic.     Mouth/Throat:     Mouth: Mucous membranes are moist.  Eyes:     Extraocular Movements: Extraocular movements intact.      Conjunctiva/sclera: Conjunctivae normal.     Pupils: Pupils are equal, round, and reactive to light.  Cardiovascular:     Rate and Rhythm: Normal rate and regular rhythm.     Heart sounds: No murmur heard. Pulmonary:     Breath sounds: No rales.     Comments: Occasionally wheezes at end of the expiration. Abdominal:     General: Bowel sounds are normal.     Palpations: Abdomen is soft.     Tenderness: There is no abdominal tenderness.  Musculoskeletal:     Cervical back: Normal range of motion and neck supple.     Right lower leg: No edema.     Left lower leg: No edema.     Comments: Ambulates with walker.   Skin:    General: Skin is warm and dry.  Neurological:     General: No focal deficit present.     Mental Status: She is alert and oriented to person, place, and time. Mental status is at baseline.     Motor: No weakness.     Coordination: Coordination normal.     Gait: Gait abnormal.  Psychiatric:        Mood and Affect: Mood normal.        Behavior: Behavior normal.        Thought Content: Thought content normal.        Judgment: Judgment normal.     Labs reviewed: Recent Labs    12/06/21 0750 05/07/22 0630  NA 138 142  K 4.3 4.0  CL 102 103  CO2 29 29  GLUCOSE 85 84  BUN 31* 27*  CREATININE 1.34* 1.25*  CALCIUM 9.3 9.6   Recent Labs    12/06/21 0750 05/07/22 0630  AST 13 15  ALT 6 6  BILITOT 0.6 0.7  PROT 6.3 6.6   Recent Labs    05/07/22 0630  WBC 7.6  HGB 13.4  HCT 40.4  MCV 87.8  PLT 267   Lab Results  Component Value Date   TSH 1.52 11/10/2018   Lab Results  Component Value Date   HGBA1C 5.7 (H) 07/25/2014   Lab Results  Component Value Date   CHOL 154 12/06/2021   HDL 51 12/06/2021   LDLCALC 86 12/06/2021   TRIG 77 12/06/2021  CHOLHDL 3.0 12/06/2021    Significant Diagnostic Results in last 30 days:  No results found.  Assessment/Plan Hypertension blood pressure is controlled on Metoprolol. Bun/creat 27/1.25 05/07/22,  update CMP/eGFR, CBC/diff, TSH  CKD (chronic kidney disease) stage 3, GFR 30-59 ml/min (HCC) Bun/creat 27/1.25 05/07/22  Hyperlipidemia LDL goal <70 stable on Crestor 5mg  qd, LDL 86 12/06/21  Chronic back pain lower back, f/u ortho,  stable, on Norco   Chronic constipation stable, takes Colace prn  Impaired memory [facility] for care, update MMSE     Family/ staff Communication: plan of care reviewed with the patient and charge nurse.   Labs/tests ordered:  none  Time spend 40 minutes.

## 2022-10-25 NOTE — Assessment & Plan Note (Signed)
stable, takes Colace prn

## 2022-10-25 NOTE — Assessment & Plan Note (Signed)
stable on Crestor 5mg  qd, LDL 86 12/06/21

## 2022-10-29 ENCOUNTER — Non-Acute Institutional Stay (SKILLED_NURSING_FACILITY): Payer: Medicare Other | Admitting: Family Medicine

## 2022-10-29 DIAGNOSIS — I10 Essential (primary) hypertension: Secondary | ICD-10-CM | POA: Diagnosis not present

## 2022-10-29 DIAGNOSIS — M545 Low back pain, unspecified: Secondary | ICD-10-CM | POA: Diagnosis not present

## 2022-10-29 DIAGNOSIS — E785 Hyperlipidemia, unspecified: Secondary | ICD-10-CM | POA: Diagnosis not present

## 2022-10-29 DIAGNOSIS — G8929 Other chronic pain: Secondary | ICD-10-CM

## 2022-10-29 DIAGNOSIS — H903 Sensorineural hearing loss, bilateral: Secondary | ICD-10-CM | POA: Diagnosis not present

## 2022-10-29 DIAGNOSIS — R4189 Other symptoms and signs involving cognitive functions and awareness: Secondary | ICD-10-CM

## 2022-10-29 NOTE — Progress Notes (Signed)
Provider:  Jacalyn Lefevre, MD Location:      Place of Service:     PCP: Frederica Kuster, MD Patient Care Team: Frederica Kuster, MD as PCP - General (Family Medicine) Chilton Si, MD as PCP - Cardiology (Cardiology) Myrtis Hopping, MD (Inactive) as Consulting Physician (Neurosurgery) Keturah Barre, MD as Consulting Physician (Otolaryngology) Meryl Dare, MD as Consulting Physician (Gastroenterology) Elliot Cousin, OD as Consulting Physician (Optometry) Sheran Luz, MD as Consulting Physician (Physical Medicine and Rehabilitation) Ranee Gosselin, MD as Consulting Physician (Orthopedic Surgery) [place], [facility] Chilton Si, MD as Attending Physician (Cardiology) Sherrie George, MD as Consulting Physician (Ophthalmology) Mast, Man X, NP as Nurse Practitioner (Internal Medicine)  Extended Emergency Contact Information Primary Emergency Contact: Gwenlyn Fudge of [place] Home Phone: [phone] Mobile Phone: [phone] Relation: Son Secondary Emergency Contact: Towanda Malkin [place] Mobile Phone: [phone] Relation: Other  Code Status:  Goals of Care: Advanced Directive information    10/25/2022   12:35 PM  Advanced Directives  Does Patient Have a Medical Advance Directive? Yes  Type of Estate agent of [place];Living will  Does patient want to make changes to medical advance directive? No - Patient declined  Copy of Healthcare Power of Attorney in Chart? Yes - validated most recent copy scanned in chart (See row information)      No chief complaint on file.   HPI: Patient is a 89 y.o. female seen today for admission to [facility] assisted living.  Patient had been living in an independent apartment but was transferred here for increasing care needs.  Today she seems confused.  She tells me she was living in a house rather than apartment on this location.   Furthermore and trying to take medical history she denies any sort of problems but she does in fact have high blood pressure chronic back pain hearing loss, hyperlipidemia, and spinal stenosis. I had seen her previously in clinic with her son who is visiting from [place].  According to son there is some concern regarding her memory  Past Medical History:  Diagnosis Date   Acute upper respiratory infections of unspecified site 2013   Cellulitis and abscess of hand, except fingers and thumb 10/04/2010   Degeneration of intervertebral disc, site unspecified 2000   Diaphragmatic hernia without mention of obstruction or gangrene 08/15/1998   Diverticulosis of colon (without mention of hemorrhage) 2000   Hearing loss 09/28/2015   Hyperlipidemia 2007   Hypertension 2007   Impacted cerumen    Loss of weight 04/25/2011   Myalgia and myositis, unspecified 01/07/2007   Nontoxic uninodular goiter 08/15/1998   Osteoarthrosis, unspecified whether generalized or localized, unspecified site 07/22/2000   Osteoporosis 2000   Other drug allergy(995.27) 04/04/2011   Pain in limb 2012   Personal history of fall 2012   Pulmonary hypertension (HCC) 07/25/14   Mild-moderate   Spinal stenosis, unspecified region other than cervical 2002   Sprain of ankle, left    Supraventricular premature beats 10/2006   Tricuspid regurgitation 07/25/14   Mild-moderate   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 Lynnette Caffey   CATARACT EXTRACTION W/ INTRAOCULAR LENS IMPLANT  Right 05/19/2014   Dr. Dione Booze   COLONOSCOPY  1992   acute segmental colitis Dr. Russella Dar   FACIAL COSMETIC SURGERY  1995   Dr. Charolotte Eke   IR [facility] LUMBAR INC FX REDUCE BONE BX UNI/BIL CANNULATION INC/IMAGING   04/18/2020   LARYNGOSCOPY  1987   and biopsy  Dr. Haroldine Laws    reports that she has never smoked. She has never used smokeless tobacco. She reports that she does not drink alcohol and does not use drugs. Social History   Socioeconomic History   Marital status: Widowed    Spouse name: Not on file   Number of children: Not on file   Years of education: Not on file   Highest education level: Not on file  Occupational History   Occupation: retired Film/video editor  Tobacco Use   Smoking status: Never   Smokeless tobacco: Never  Vaping Use   Vaping status: Never Used  Substance and Sexual Activity   Alcohol use: No   Drug use: No   Sexual activity: Never  Other Topics Concern   Not on file  Social History Narrative   Lives at [facility] since 7/[address]12   Widowed   Never smoke   No alcohol   Exercise none   Walks with walker   Living Will , POA          Social Determinants of Health   Financial Resource Strain: Not on file  Food Insecurity: Not on file  Transportation Needs: Not on file  Physical Activity: Not on file  Stress: Not on file  Social Connections: Not on file  Intimate Partner Violence: Not on file    Functional Status Survey:    Family History  Problem Relation Age of Onset   Stroke Mother    Stroke Father    Hypertension Brother    Rheum arthritis Maternal Grandmother    Pancreatic cancer Sister     Health Maintenance  Topic Date Due   Medicare Annual Wellness (AWV)  Never done   DTaP/Tdap/Td (2 - Tdap) 04/24/2021   COVID-19 Vaccine (4 - 2023-24 season) 03/07/2022   INFLUENZA VACCINE  10/10/2022   DEXA SCAN  02/06/2023 (Originally 09/02/1991)   Pneumonia Vaccine 38+ Years old (3 of 3 - PPSV23 or PCV20) 12/10/2023   Zoster Vaccines- Shingrix  Completed   HPV VACCINES  Aged Out    Allergies  Allergen Reactions   Doxycycline Itching   Lipitor [Atorvastatin] Other (See Comments)    PAIN IN LEGS   Pantoprazole Itching   Pepcid  [Famotidine] Itching    Outpatient Encounter Medications as of 10/29/2022  Medication Sig   cholecalciferol (VITAMIN D) 1000 UNITS tablet Take 1,000 Units by mouth daily.    docusate sodium (COLACE) 100 MG capsule Take 100 mg by mouth 2 (two) times daily as needed for mild constipation.   HYDROcodone-acetaminophen (NORCO/VICODIN) 5-325 MG per tablet Take 1 tablet by mouth 3 (three) times daily as needed for moderate pain.   Magnesium 250 MG TABS Take 250 mg by mouth daily at 2 PM.    metoprolol succinate (TOPROL-XL) 25 MG 24 hr tablet TAKE 1 TABLET ONCE DAILY.   rosuvastatin (CRESTOR) 5 MG tablet TAKE ONE TABLET BY MOUTH DAILY TO LOWER CHOLESTEROL   No facility-administered encounter medications on file as of 10/29/2022.    Review of Systems  Unable to perform ROS: Dementia  HENT:  Positive for hearing loss.   Musculoskeletal:  Positive for back pain and gait problem.  There were no vitals filed for this visit. There is no height or weight on file to calculate BMI. Physical Exam Vitals and nursing note reviewed.  Constitutional:      Appearance: Normal appearance.  HENT:     Head: Normocephalic.     Mouth/Throat:     Mouth: Mucous membranes are moist.  Cardiovascular:     Rate and Rhythm: Normal rate and regular rhythm.  Pulmonary:     Effort: Pulmonary effort is normal.     Breath sounds: Normal breath sounds.  Abdominal:     General: Bowel sounds are normal.     Palpations: Abdomen is soft.  Musculoskeletal:     Cervical back: Normal range of motion.     Comments: Uses walker for ambulation  Neurological:     Mental Status: She is alert.     Comments: Oriented to name but not place or time and  Psychiatric:        Mood and Affect: Mood normal.        Behavior: Behavior normal.     Labs reviewed: Basic Metabolic Panel: Recent Labs    12/06/21 0750 05/07/22 0630  NA 138 142  K 4.3 4.0  CL 102 103  CO2 29 29  GLUCOSE 85 84  BUN 31* 27*  CREATININE 1.34*  1.25*  CALCIUM 9.3 9.6   Liver Function Tests: Recent Labs    12/06/21 0750 05/07/22 0630  AST 13 15  ALT 6 6  BILITOT 0.6 0.7  PROT 6.3 6.6   No results for input(s): "LIPASE", "AMYLASE" in the last 8760 hours. No results for input(s): "AMMONIA" in the last 8760 hours. CBC: Recent Labs    05/07/22 0630  WBC 7.6  HGB 13.4  HCT 40.4  MCV 87.8  PLT 267   Cardiac Enzymes: No results for input(s): "CKTOTAL", "CKMB", "CKMBINDEX", "TROPONINI" in the last 8760 hours. BNP: Invalid input(s): "POCBNP" Lab Results  Component Value Date   HGBA1C 5.7 (H) 07/25/2014   Lab Results  Component Value Date   TSH 1.52 11/10/2018   Lab Results  Component Value Date   VITAMINB12 1122 (H) 09/19/2010   Lab Results  Component Value Date   FOLATE >20.0 09/19/2010   Lab Results  Component Value Date   IRON 18 (L) 09/19/2010   TIBC 207 (L) 09/19/2010   FERRITIN 62 12/09/2016    Imaging and Procedures obtained prior to SNF admission: IR KYPHO LUMBAR INC FX REDUCE BONE BX UNI/BIL CANNULATION INC/IMAGING  Result Date: 04/20/2020 INDICATION: Severe low back pain secondary to compression fracture at L2. EXAM: BALLOON KYPHOPLASTY AT L2 COMPARISON:  MRI of the lumbosacral spine of March 23, 2020. MEDICATIONS: As antibiotic prophylaxis, Ancef 2 g IV was ordered pre-procedure and administered intravenously within 1 hour of incision. ANESTHESIA/SEDATION: Moderate (conscious) sedation was employed during this procedure. A total of Versed 2 mg and Fentanyl 100 mcg was administered intravenously. Moderate Sedation Time: 45 minutes. The patient's level of consciousness and vital signs were monitored continuously by radiology nursing throughout the procedure under my direct supervision. FLUOROSCOPY TIME:  Fluoroscopy Time: 19 minutes 6 seconds (6909 mGy) COMPLICATIONS: None immediate. PROCEDURE: Following a full explanation of the procedure along with the potential associated complications, an informed  witnessed consent was obtained. The patient was placed prone on the fluoroscopic table. The skin overlying the lumbar region was then prepped and draped in the usual sterile fashion. Both pedicles at L2 were then infiltrated with 0.25% bupivacaine followed by the advancement  of an 11-gauge Jamshidi needle through each pedicle into the posterior one-third at L2. These were then exchanged for a Kyphon advanced osteo introducer system comprised of a working cannula and a Kyphon osteo drill. The combinations were then advanced over a Kyphon osteo bone pin until the tip of the Kyphon osteo drill was in the posterior third at L2. At this time, the bone pins were removed. In a medial trajectory, the combination was advanced until the tips of the working cannulae were inside the posterior one-third at L2. The osteo drills were removed and core samples sent for pathologic analysis. Through the working cannulae, a Kyphon bone biopsy device was advanced to within 5 mm of the anterior aspect of L2. A core sample from this was also sent for pathologic analysis. Through the working cannulae, a Kyphon inflatable bone tamp 20 x 3 were advanced and positioned with the distal marker 5 mm from the anterior aspect of L2. Crossing of the midline was seen on the AP projection. At this time, the balloons were expanded using contrast via a Kyphon inflation syringe device via microtubing. Inflations were continued until there was apposition with the superior endplate. At this time, methylmethacrylate mixture was reconstituted with Tobramycin in the Kyphon bone mixing device system. This was then loaded onto the Kyphon bone fillers. The balloon was deflated and removed followed by the instillation of 5 bone filler equivalents of methylmethacrylate mixture at L2 with excellent filling in the AP and lateral projections. No extravasation was noted in the disk spaces or posteriorly into the spinal canal. Small amount of contrast was seen  extruding in the right paravertebral vein on the right at L2. This remained stable throughout the procedure. The working cannulae and the bone fillers were then retrieved and removed. Hemostasis was achieved at the skin entry sites. IMPRESSION: 1. Status post fluoroscopic-guided needle placement for deep core bone biopsy at L2. 2. Status post vertebral body augmentation using balloon kyphoplasty at L2 as described without event. Patient and son informed to check with the referring MD regarding results of the biopsy. They expressed understanding and agreement with the above management plan. Electronically Signed   By: Julieanne Cotton M.D.   On: 04/19/2020 09:24    Assessment/Plan 1. Chronic midline low back pain without sciatica Seen multiple times with orthopedic.  They have given her Norco that she takes as needed for back pain  2. Sensorineural hearing loss (SNHL) of both ears Has hearing aids but necessary to repeat questions multiple times  3. Primary hypertension Blood pressure today 120/60.  Medication metoprolol XL 25 mg daily  4. Hyperlipidemia LDL goal <70 Continues to take rosuvastatin 5 mg.  Lipids last assessed 10 months ago LDL was 86  5. Cognitive change Looking back over notes from recent years there have not been comments by other providers on her cognitive changes.  She does seem very confused today and maybe that is a function of new environment having moved from her apartment to assisted living but even if this changes responsibilities still suggest there is some cognitive decline.  I have ordered a more extensive test of cognition either slums or MMSE.  BIMS test that was done today suggest that she does have some dementia    Family/ staff Communication:   Labs/tests ordered:  Bertram Millard. Hyacinth Meeker, MD [facility] [address] [place], [place] [address] Office [phone]

## 2022-10-30 ENCOUNTER — Non-Acute Institutional Stay (INDEPENDENT_AMBULATORY_CARE_PROVIDER_SITE_OTHER): Payer: Medicare Other | Admitting: Nurse Practitioner

## 2022-10-30 ENCOUNTER — Encounter: Payer: Self-pay | Admitting: Nurse Practitioner

## 2022-10-30 DIAGNOSIS — Z0000 Encounter for general adult medical examination without abnormal findings: Secondary | ICD-10-CM | POA: Diagnosis not present

## 2022-10-30 NOTE — Progress Notes (Signed)
Subjective:   [name] is a 89 y.o. female who presents for Medicare Annual (Subsequent) preventive examination.  Visit Complete:   Patient Medicare AWV questionnaire was completed by the patient on 10/30/22; I have confirmed that all information answered by patient is correct and no changes since this date.   Cardiac Risk Factors include: advanced age (>37men, >38 women);dyslipidemia;hypertension     Objective:    Today's Vitals   10/30/22 1217  BP: 120/60  Pulse: 76  Resp: 19  Temp: (!) 97.3 F (36.3 C)  SpO2: 96%  Weight: 105 lb 3.2 oz (47.7 kg)   Body mass index is 17.51 kg/m.     10/25/2022   12:35 PM 05/02/2022   10:53 AM 02/14/2022    9:15 AM 01/17/2022    8:49 AM 04/18/2020    9:56 AM 05/27/2019   11:13 AM 11/12/2018    1:55 PM  Advanced Directives  Does Patient Have a Medical Advance Directive? Yes Yes Yes Yes No Yes Yes  Type of Estate agent of [place];Living will Healthcare Power of [place];Living will Healthcare Power of [place];Living will Healthcare Power of [place];Living will  Living will;Healthcare Power of [employer] Power of [place];Living will  Does patient want to make changes to medical advance directive? No - Patient declined No - Patient declined No - Patient declined No - Patient declined  No - Patient declined No - Guardian declined  Copy of Healthcare Power of Attorney in Chart? Yes - validated most recent copy scanned in chart (See row information) Yes - validated most recent copy scanned in chart (See row information) Yes - validated most recent copy scanned in chart (See row information) Yes - validated most recent copy scanned in chart (See row information)  Yes - validated most recent copy scanned in chart (See row information)   Would patient like information on creating a medical advance directive?     No - Patient declined      Current Medications (verified) Outpatient Encounter Medications as of  10/30/2022  Medication Sig   cholecalciferol (VITAMIN D) 1000 UNITS tablet Take 1,000 Units by mouth daily.    docusate sodium (COLACE) 100 MG capsule Take 100 mg by mouth 2 (two) times daily as needed for mild constipation.   HYDROcodone-acetaminophen (NORCO/VICODIN) 5-325 MG per tablet Take 1 tablet by mouth 3 (three) times daily as needed for moderate pain.   Magnesium 250 MG TABS Take 250 mg by mouth daily at 2 PM.    metoprolol succinate (TOPROL-XL) 25 MG 24 hr tablet TAKE 1 TABLET ONCE DAILY.   rosuvastatin (CRESTOR) 5 MG tablet TAKE ONE TABLET BY MOUTH DAILY TO LOWER CHOLESTEROL   No facility-administered encounter medications on file as of 10/30/2022.    Allergies (verified) Doxycycline, Lipitor [atorvastatin], Pantoprazole, and Pepcid [famotidine]   History: Past Medical History:  Diagnosis Date   Acute upper respiratory infections of unspecified site 2013   Cellulitis and abscess of hand, except fingers and thumb 10/04/2010   Degeneration of intervertebral disc, site unspecified 2000   Diaphragmatic hernia without mention of obstruction or gangrene 08/15/1998   Diverticulosis of colon (without mention of hemorrhage) 2000   Hearing loss 09/28/2015   Hyperlipidemia 2007   Hypertension 2007   Impacted cerumen    Loss of weight 04/25/2011   Myalgia and myositis, unspecified 01/07/2007   Nontoxic uninodular goiter 08/15/1998   Osteoarthrosis, unspecified whether generalized or localized, unspecified site 07/22/2000   Osteoporosis 2000   Other drug  allergy(995.27) 04/04/2011   Pain in limb 2012   Personal history of fall 2012   Pulmonary hypertension (HCC) 07/25/14   Mild-moderate   Spinal stenosis, unspecified region other than cervical 2002   Sprain of ankle, left    Supraventricular premature beats 10/2006   Tricuspid regurgitation 07/25/14   Mild-moderate   Unspecified late effects of cerebrovascular disease 01/01/2006   Urinary tract infection, site not specified    Urine  frequency 01/20/2014   Urticaria, unspecified 11/01/2008   Vitamin D deficiency 04/25/2011   Past Surgical History:  Procedure Laterality Date   BACK SURGERY  1990   HNP L3-4 Dr. Lynnette Caffey   CATARACT EXTRACTION W/ INTRAOCULAR LENS IMPLANT Right 05/19/2014   Dr. Dione Booze   COLONOSCOPY  1992   acute segmental colitis Dr. Russella Dar   FACIAL COSMETIC SURGERY  1995   Dr. Charolotte Eke   IR KYPHO LUMBAR INC FX REDUCE BONE BX UNI/BIL CANNULATION INC/IMAGING  04/18/2020   LARYNGOSCOPY  1987   and biopsy  Dr. Haroldine Laws   Family History  Problem Relation Age of Onset   Stroke Mother    Stroke Father    Hypertension Brother    Rheum arthritis Maternal Grandmother    Pancreatic cancer Sister    Social History   Socioeconomic History   Marital status: Widowed    Spouse name: Not on file   Number of children: Not on file   Years of education: Not on file   Highest education level: Not on file  Occupational History   Occupation: retired Film/video editor  Tobacco Use   Smoking status: Never   Smokeless tobacco: Never  Vaping Use   Vaping status: Never Used  Substance and Sexual Activity   Alcohol use: No   Drug use: No   Sexual activity: Never  Other Topics Concern   Not on file  Social History Narrative   Lives at [facility] [place] since 7/[address]   Widowed   Never smoke   No alcohol   Exercise none   Walks with walker   Living Will , POA          Social Determinants of Health   Financial Resource Strain: Not on file  Food Insecurity: Not on file  Transportation Needs: Not on file  Physical Activity: Not on file  Stress: Not on file  Social Connections: Not on file    Tobacco Counseling Counseling given: Not Answered   Clinical Intake:  Pre-visit preparation completed: Yes  Pain : No/denies pain     BMI - recorded: 17.51 Nutritional Status: BMI <19  Underweight Nutritional Risks: Unintentional weight loss (about #5Ibs in the past year, admitted to [facility] for care  needs.) Diabetes: No  How often do you need to have someone help you when you read instructions, pamphlets, or other written materials from your doctor or pharmacy?: 3 - Sometimes What is the last grade level you completed in school?: high school  Interpreter Needed?: No  Information entered by :: Roneisha Stern Nedra Hai NP   Activities of Daily Living    10/30/2022   12:26 PM  In your present state of health, do you have any difficulty performing the following activities:  Hearing? 1  Vision? 1  Difficulty concentrating or making decisions? 1  Walking or climbing stairs? 1  Dressing or bathing? 1  Doing errands, shopping? 1  Preparing Food and eating ? N  In the past six months, have you accidently leaked urine? Y  Do you have  problems with loss of bowel control? N  Managing your Medications? Y  Managing your Finances? Y  Housekeeping or managing your Housekeeping? Y    Patient Care Team: Venita Sheffield, MD as PCP - General (Internal Medicine) Chilton Si, MD as PCP - Cardiology (Cardiology) Myrtis Hopping, MD (Inactive) as Consulting Physician (Neurosurgery) Keturah Barre, MD as Consulting Physician (Otolaryngology) Meryl Dare, MD as Consulting Physician (Gastroenterology) Elliot Cousin, OD as Consulting Physician (Optometry) Sheran Luz, MD as Consulting Physician (Physical Medicine and Rehabilitation) Ranee Gosselin, MD as Consulting Physician (Orthopedic Surgery) [place], [facility] Chilton Si, MD as Attending Physician (Cardiology) Sherrie George, MD as Consulting Physician (Ophthalmology) Tirza Senteno X, NP as Nurse Practitioner (Internal Medicine)  Indicate any recent Medical Services you may have received from other than [facility] providers in the past year (date may be approximate).     Assessment:   This is a routine wellness examination for [name].  Hearing/Vision screen No results found.  Dietary issues and exercise activities  discussed:     Goals Addressed             This Visit's Progress    Maintain Mobility and Function       Evidence-based guidance:  Acknowledge and validate impact of pain, loss of strength and potential disfigurement (hand osteoarthritis) on mental health and daily life, such as social isolation, anxiety, depression, impaired sexual relationship and   injury from falls.  Anticipate referral to physical or occupational therapy for assessment, therapeutic exercise and recommendation for adaptive equipment or assistive devices; encourage participation.  Assess impact on ability to perform activities of daily living, as well as engage in sports and leisure events or requirements of work or school.  Provide anticipatory guidance and reassurance about the benefit of exercise to maintain function; acknowledge and normalize fear that exercise may worsen symptoms.  Encourage regular exercise, at least 10 minutes at a time for 45 minutes per week; consider yoga, water exercise and proprioceptive exercises; encourage use of wearable activity tracker to increase motivation and adherence.  Encourage maintenance or resumption of daily activities, including employment, as pain allows and with minimal exposure to trauma.  Assist patient to advocate for adaptations to the work environment.  Consider level of pain and function, gender, age, lifestyle, patient preference, quality of life, readiness and ?ocapacity to benefit? when recommending patients for orthopaedic surgery consultation.  Explore strategies, such as changes to medication regimen or activity that enables patient to anticipate and manage flare-ups that increase deconditioning and disability.  Explore patient preferences; encourage exposure to a broader range of activities that have been avoided for fear of experiencing pain.  Identify barriers to participation in therapy or exercise, such as pain with activity, anticipated or imagined pain.   Monitor postoperative joint replacement or any preexisting joint replacement for ongoing pain and loss of function; provide social support and encouragement throughout recovery.   Notes:        Depression Screen    10/30/2022   12:28 PM 05/21/2018    2:13 PM 05/20/2017    9:41 AM 05/09/2016    2:29 PM 09/28/2015    1:57 PM 03/02/2015    2:16 PM 08/04/2014   10:06 AM  PHQ 2/9 Scores  PHQ - 2 Score 0 0 0 0 0 0 0    Fall Risk    08/28/2022    1:41 PM 05/02/2022   10:53 AM 02/14/2022    9:16 AM 01/17/2022    8:49 AM 02/14/2021  3:28 PM  Fall Risk   Falls in the past year? 0 0 0 0 0  Number falls in past yr: 0 0 0 0 0  Injury with Fall? 0 0 0 0 0  Risk for fall due to : No Fall Risks No Fall Risks No Fall Risks No Fall Risks No Fall Risks  Follow up Falls evaluation completed Falls evaluation completed Falls evaluation completed Falls evaluation completed Falls evaluation completed;Education provided;Falls prevention discussed    MEDICARE RISK AT HOME: Medicare Risk at Home Any stairs in or around the home?: Yes If so, are there any without handrails?: No Home free of loose throw rugs in walkways, pet beds, electrical cords, etc?: Yes Adequate lighting in your home to reduce risk of falls?: Yes Life alert?: No Use of a cane, walker or w/c?: Yes Grab bars in the bathroom?: Yes Shower chair or bench in shower?: Yes Elevated toilet seat or a handicapped toilet?: Yes  TIMED UP AND GO:  Was the test performed?  Yes  Length of time to ambulate 10 feet: 8 sec Gait slow and steady with assistive device    Cognitive Function:    10/31/2022   10:13 AM 05/21/2018    2:21 PM 05/20/2017    9:43 AM 05/09/2016    2:38 PM  MMSE - Mini Mental State Exam  Not completed:  -- --   Orientation to time 1 4 5 5   Orientation to Place 5 5 5 5   Registration 3 3 3 3   Attention/ Calculation 2 5 5 4   Recall 2 3 2 3   Language- name 2 objects 2 2 2 2   Language- repeat 1 1 1 1   Language- follow 3  step command 3 3 3 3   Language- read & follow direction 1 1 1 1   Write a sentence 1 1 1 1   Copy design 1 0 1 1  Total score 22 28 29 29         Immunizations Immunization History  Administered Date(s) Administered   Fluad Quad(high Dose 65+) 12/25/2021   Influenza Whole 12/10/2011, 12/23/2012, 10/26/2017   Influenza, High Dose Seasonal PF 12/18/2016, 12/10/2018   Influenza,inj,quad, With Preservative 11/17/2017   Influenza-Unspecified 12/24/2013, 12/08/2014, 12/21/2015   Moderna Covid-19 Vaccine Bivalent Booster 16yrs & up 01/10/2022   Moderna Sars-Covid-2 Vaccination 03/15/2019, 04/12/2019   Pneumococcal Conjugate-13 12/10/2018   Pneumococcal Polysaccharide-23 03/12/1991   Td 04/25/2011   Zoster Recombinant(Shingrix) 01/30/2018, 05/20/2018   Zoster, Live 09/04/2005    TDAP status: Due, Education has been provided regarding the importance of this vaccine. Advised may receive this vaccine at local pharmacy or Health Dept. Aware to provide a copy of the vaccination record if obtained from local pharmacy or Health Dept. Verbalized acceptance and understanding.  Flu Vaccine status: Due, Education has been provided regarding the importance of this vaccine. Advised may receive this vaccine at local pharmacy or Health Dept. Aware to provide a copy of the vaccination record if obtained from local pharmacy or Health Dept. Verbalized acceptance and understanding.  Pneumococcal vaccine status: Completed during today's visit.  Covid-19 vaccine status: Information provided on how to obtain vaccines.   Qualifies for Shingles Vaccine? Yes   Zostavax completed Yes   Shingrix Completed?: Yes  Screening Tests Health Maintenance  Topic Date Due   DTaP/Tdap/Td (2 - Tdap) 04/24/2021   COVID-19 Vaccine (4 - 2023-24 season) 03/07/2022   INFLUENZA VACCINE  10/10/2022   DEXA SCAN  02/06/2023 (Originally 09/02/1991)   Medicare Annual Wellness (AWV)  10/30/2023   Pneumonia Vaccine 61+ Years old (3  of 3 - PPSV23 or PCV20) 12/10/2023   Zoster Vaccines- Shingrix  Completed   HPV VACCINES  Aged Out    Health Maintenance  Health Maintenance Due  Topic Date Due   DTaP/Tdap/Td (2 - Tdap) 04/24/2021   COVID-19 Vaccine (4 - 2023-24 season) 03/07/2022   INFLUENZA VACCINE  10/10/2022    Colorectal cancer screening: No longer required.   Mammogram status: No longer required due to aged out.  Bone Density status: Ordered 10/30/22. Pt provided with contact info and advised to call to schedule appt.  Lung Cancer Screening: (Low Dose CT Chest recommended if Age 66-80 years, 20 pack-year currently smoking OR have quit w/in 15years.) does not qualify.     Additional Screening:  Hepatitis C Screening: does not qualify;   Vision Screening: Recommended annual ophthalmology exams for early detection of glaucoma and other disorders of the eye. Is the patient up to date with their annual eye exam?  Yes  Who is the provider or what is the name of the office in which the patient attends annual eye exams? Dr Marolyn Haller If pt is not established with a provider, would they like to be referred to a provider to establish care? No .   Dental Screening: Recommended annual dental exams for proper oral hygiene    Community Resource Referral / Chronic Care Management: CRR required this visit?  No   CCM required this visit?  No     Plan:     I have personally reviewed and noted the following in the patient's chart:   Medical and social history Use of alcohol, tobacco or illicit drugs  Current medications and supplements including opioid prescriptions. Patient is not currently taking opioid prescriptions. Functional ability and status Nutritional status Physical activity Advanced directives List of other physicians Hospitalizations, surgeries, and ER visits in previous 12 months Vitals Screenings to include cognitive, depression, and falls Referrals and appointments  In addition, I have  reviewed and discussed with patient certain preventive protocols, quality metrics, and best practice recommendations. A written personalized care plan for preventive services as well as general preventive health recommendations were provided to patient.     Zurie Platas X Jovie Swanner, NP   10/31/2022

## 2022-12-16 ENCOUNTER — Emergency Department (HOSPITAL_COMMUNITY): Payer: Medicare Other

## 2022-12-16 ENCOUNTER — Encounter: Payer: Self-pay | Admitting: Sports Medicine

## 2022-12-16 ENCOUNTER — Encounter (HOSPITAL_COMMUNITY): Payer: Self-pay | Admitting: Emergency Medicine

## 2022-12-16 ENCOUNTER — Inpatient Hospital Stay (HOSPITAL_COMMUNITY): Payer: Medicare Other

## 2022-12-16 ENCOUNTER — Inpatient Hospital Stay (HOSPITAL_COMMUNITY)
Admission: EM | Admit: 2022-12-16 | Discharge: 2022-12-20 | DRG: 065 | Disposition: A | Discharge status: Skilled Nursing Facility | Payer: Medicare Other | Source: Skilled Nursing Facility | Attending: Family Medicine | Admitting: Family Medicine

## 2022-12-16 ENCOUNTER — Non-Acute Institutional Stay (SKILLED_NURSING_FACILITY): Payer: Medicare Other | Admitting: Sports Medicine

## 2022-12-16 DIAGNOSIS — I6381 Other cerebral infarction due to occlusion or stenosis of small artery: Secondary | ICD-10-CM | POA: Diagnosis present

## 2022-12-16 DIAGNOSIS — I272 Pulmonary hypertension, unspecified: Secondary | ICD-10-CM | POA: Diagnosis present

## 2022-12-16 DIAGNOSIS — R269 Unspecified abnormalities of gait and mobility: Secondary | ICD-10-CM | POA: Diagnosis present

## 2022-12-16 DIAGNOSIS — Z881 Allergy status to other antibiotic agents status: Secondary | ICD-10-CM

## 2022-12-16 DIAGNOSIS — R29714 NIHSS score 14: Secondary | ICD-10-CM | POA: Diagnosis not present

## 2022-12-16 DIAGNOSIS — Z8249 Family history of ischemic heart disease and other diseases of the circulatory system: Secondary | ICD-10-CM

## 2022-12-16 DIAGNOSIS — R531 Weakness: Secondary | ICD-10-CM | POA: Diagnosis not present

## 2022-12-16 DIAGNOSIS — G8191 Hemiplegia, unspecified affecting right dominant side: Secondary | ICD-10-CM | POA: Diagnosis not present

## 2022-12-16 DIAGNOSIS — E785 Hyperlipidemia, unspecified: Secondary | ICD-10-CM | POA: Diagnosis present

## 2022-12-16 DIAGNOSIS — Z87440 Personal history of urinary (tract) infections: Secondary | ICD-10-CM

## 2022-12-16 DIAGNOSIS — R0989 Other specified symptoms and signs involving the circulatory and respiratory systems: Secondary | ICD-10-CM | POA: Diagnosis not present

## 2022-12-16 DIAGNOSIS — Z7902 Long term (current) use of antithrombotics/antiplatelets: Secondary | ICD-10-CM

## 2022-12-16 DIAGNOSIS — M810 Age-related osteoporosis without current pathological fracture: Secondary | ICD-10-CM | POA: Diagnosis present

## 2022-12-16 DIAGNOSIS — R29703 NIHSS score 3: Secondary | ICD-10-CM | POA: Diagnosis present

## 2022-12-16 DIAGNOSIS — Z681 Body mass index (BMI) 19 or less, adult: Secondary | ICD-10-CM

## 2022-12-16 DIAGNOSIS — Z823 Family history of stroke: Secondary | ICD-10-CM

## 2022-12-16 DIAGNOSIS — I10 Essential (primary) hypertension: Secondary | ICD-10-CM | POA: Diagnosis present

## 2022-12-16 DIAGNOSIS — R29810 Facial weakness: Secondary | ICD-10-CM | POA: Diagnosis not present

## 2022-12-16 DIAGNOSIS — Z888 Allergy status to other drugs, medicaments and biological substances status: Secondary | ICD-10-CM

## 2022-12-16 DIAGNOSIS — Z9181 History of falling: Secondary | ICD-10-CM

## 2022-12-16 DIAGNOSIS — R413 Other amnesia: Secondary | ICD-10-CM | POA: Diagnosis present

## 2022-12-16 DIAGNOSIS — R471 Dysarthria and anarthria: Secondary | ICD-10-CM | POA: Diagnosis not present

## 2022-12-16 DIAGNOSIS — Z79899 Other long term (current) drug therapy: Secondary | ICD-10-CM

## 2022-12-16 DIAGNOSIS — I639 Cerebral infarction, unspecified: Secondary | ICD-10-CM | POA: Diagnosis present

## 2022-12-16 DIAGNOSIS — I071 Rheumatic tricuspid insufficiency: Secondary | ICD-10-CM | POA: Diagnosis present

## 2022-12-16 DIAGNOSIS — H9190 Unspecified hearing loss, unspecified ear: Secondary | ICD-10-CM | POA: Diagnosis present

## 2022-12-16 DIAGNOSIS — R636 Underweight: Secondary | ICD-10-CM | POA: Diagnosis present

## 2022-12-16 DIAGNOSIS — M6281 Muscle weakness (generalized): Secondary | ICD-10-CM

## 2022-12-16 DIAGNOSIS — R2681 Unsteadiness on feet: Secondary | ICD-10-CM | POA: Diagnosis present

## 2022-12-16 DIAGNOSIS — Z7982 Long term (current) use of aspirin: Secondary | ICD-10-CM

## 2022-12-16 DIAGNOSIS — I63412 Cerebral infarction due to embolism of left middle cerebral artery: Secondary | ICD-10-CM | POA: Diagnosis not present

## 2022-12-16 DIAGNOSIS — Z8261 Family history of arthritis: Secondary | ICD-10-CM

## 2022-12-16 DIAGNOSIS — R4701 Aphasia: Secondary | ICD-10-CM | POA: Diagnosis present

## 2022-12-16 DIAGNOSIS — I6389 Other cerebral infarction: Secondary | ICD-10-CM | POA: Diagnosis not present

## 2022-12-16 DIAGNOSIS — R296 Repeated falls: Secondary | ICD-10-CM

## 2022-12-16 DIAGNOSIS — Z8673 Personal history of transient ischemic attack (TIA), and cerebral infarction without residual deficits: Secondary | ICD-10-CM

## 2022-12-16 DIAGNOSIS — Z800 Family history of malignant neoplasm of digestive organs: Secondary | ICD-10-CM

## 2022-12-16 LAB — URINALYSIS, ROUTINE W REFLEX MICROSCOPIC
Bacteria, UA: NONE SEEN
Bilirubin Urine: NEGATIVE
Glucose, UA: NEGATIVE mg/dL
Hgb urine dipstick: NEGATIVE
Ketones, ur: NEGATIVE mg/dL
Nitrite: NEGATIVE
Protein, ur: NEGATIVE mg/dL
Specific Gravity, Urine: 1.01 (ref 1.005–1.030)
pH: 7 (ref 5.0–8.0)

## 2022-12-16 LAB — CBC WITH DIFFERENTIAL/PLATELET
Abs Immature Granulocytes: 0.03 10*3/uL (ref 0.00–0.07)
Basophils Absolute: 0.1 10*3/uL (ref 0.0–0.1)
Basophils Relative: 1 %
Eosinophils Absolute: 0.1 10*3/uL (ref 0.0–0.5)
Eosinophils Relative: 1 %
HCT: 38.4 % (ref 36.0–46.0)
Hemoglobin: 12.5 g/dL (ref 12.0–15.0)
Immature Granulocytes: 0 %
Lymphocytes Relative: 31 %
Lymphs Abs: 2.5 10*3/uL (ref 0.7–4.0)
MCH: 29.9 pg (ref 26.0–34.0)
MCHC: 32.6 g/dL (ref 30.0–36.0)
MCV: 91.9 fL (ref 80.0–100.0)
Monocytes Absolute: 0.7 10*3/uL (ref 0.1–1.0)
Monocytes Relative: 8 %
Neutro Abs: 4.9 10*3/uL (ref 1.7–7.7)
Neutrophils Relative %: 59 %
Platelets: 261 10*3/uL (ref 150–400)
RBC: 4.18 MIL/uL (ref 3.87–5.11)
RDW: 14.4 % (ref 11.5–15.5)
WBC: 8.2 10*3/uL (ref 4.0–10.5)
nRBC: 0 % (ref 0.0–0.2)

## 2022-12-16 LAB — COMPREHENSIVE METABOLIC PANEL
ALT: 11 U/L (ref 0–44)
AST: 17 U/L (ref 15–41)
Albumin: 3.5 g/dL (ref 3.5–5.0)
Alkaline Phosphatase: 51 U/L (ref 38–126)
Anion gap: 8 (ref 5–15)
BUN: 24 mg/dL — ABNORMAL HIGH (ref 8–23)
CO2: 25 mmol/L (ref 22–32)
Calcium: 8.8 mg/dL — ABNORMAL LOW (ref 8.9–10.3)
Chloride: 101 mmol/L (ref 98–111)
Creatinine, Ser: 1.22 mg/dL — ABNORMAL HIGH (ref 0.44–1.00)
GFR, Estimated: 41 mL/min — ABNORMAL LOW (ref >60)
Glucose, Bld: 101 mg/dL — ABNORMAL HIGH (ref 70–99)
Potassium: 4.5 mmol/L (ref 3.5–5.1)
Sodium: 134 mmol/L — ABNORMAL LOW (ref 135–145)
Total Bilirubin: 0.6 mg/dL (ref 0.3–1.2)
Total Protein: 6.2 g/dL — ABNORMAL LOW (ref 6.5–8.1)

## 2022-12-16 LAB — CBC
HCT: 39.4 % (ref 36.0–46.0)
Hemoglobin: 13 g/dL (ref 12.0–15.0)
MCH: 29.8 pg (ref 26.0–34.0)
MCHC: 33 g/dL (ref 30.0–36.0)
MCV: 90.4 fL (ref 80.0–100.0)
Platelets: 249 10*3/uL (ref 150–400)
RBC: 4.36 MIL/uL (ref 3.87–5.11)
RDW: 14.4 % (ref 11.5–15.5)
WBC: 9.6 10*3/uL (ref 4.0–10.5)
nRBC: 0 % (ref 0.0–0.2)

## 2022-12-16 LAB — TROPONIN I (HIGH SENSITIVITY): Troponin I (High Sensitivity): 11 ng/L (ref <18)

## 2022-12-16 LAB — CREATININE, SERUM
Creatinine, Ser: 1.34 mg/dL — ABNORMAL HIGH (ref 0.44–1.00)
GFR, Estimated: 36 mL/min — ABNORMAL LOW (ref >60)

## 2022-12-16 MED ORDER — SENNOSIDES-DOCUSATE SODIUM 8.6-50 MG PO TABS: 1.0000 | ORAL_TABLET | Freq: Every evening | ORAL | Status: DC | PRN

## 2022-12-16 MED ORDER — ENOXAPARIN SODIUM 30 MG/0.3ML IJ SOSY
30.0000 mg | PREFILLED_SYRINGE | INTRAMUSCULAR | Status: DC
  Administered 2022-12-16 – 2022-12-19 (×4): 30 mg via SUBCUTANEOUS
  Filled 2022-12-16 (×4): qty 0.3

## 2022-12-16 MED ORDER — ACETAMINOPHEN 325 MG PO TABS: 650.0000 mg | ORAL_TABLET | ORAL | Status: DC | PRN

## 2022-12-16 MED ORDER — ACETAMINOPHEN 160 MG/5ML PO SOLN: 650.0000 mg | ORAL | Status: DC | PRN

## 2022-12-16 MED ORDER — PRAVASTATIN SODIUM 40 MG PO TABS
40.0000 mg | ORAL_TABLET | Freq: Every day | ORAL | Status: DC
  Administered 2022-12-16 – 2022-12-19 (×4): 40 mg via ORAL
  Filled 2022-12-16 (×2): qty 1
  Filled 2022-12-16: qty 2
  Filled 2022-12-16 (×2): qty 1

## 2022-12-16 MED ORDER — ACETAMINOPHEN 650 MG RE SUPP: 650.0000 mg | RECTAL | Status: DC | PRN

## 2022-12-16 MED ORDER — CLOPIDOGREL BISULFATE 75 MG PO TABS
75.0000 mg | ORAL_TABLET | Freq: Every day | ORAL | Status: DC
  Administered 2022-12-16 – 2022-12-19 (×4): 75 mg via ORAL
  Filled 2022-12-16 (×5): qty 1

## 2022-12-16 MED ORDER — ASPIRIN 325 MG PO TABS
325.0000 mg | ORAL_TABLET | Freq: Once | ORAL | Status: AC
  Administered 2022-12-16: 325 mg via ORAL
  Filled 2022-12-16: qty 1

## 2022-12-16 MED ORDER — STROKE: EARLY STAGES OF RECOVERY BOOK
Freq: Once | Status: AC
  Filled 2022-12-16 (×2): qty 1

## 2022-12-16 NOTE — ED Notes (Signed)
Pt passed swallow screening. Pt had dinner and milk.

## 2022-12-16 NOTE — Progress Notes (Signed)
Provider:  Venita Sheffield MD Location:   [facility] [place]   Place of Service:   Assisted living   PCP: Venita Sheffield, MD Patient Care Team: Venita Sheffield, MD as PCP - General (Internal Medicine) Chilton Si, MD as PCP - Cardiology (Cardiology) Myrtis Hopping, MD (Inactive) as Consulting Physician (Neurosurgery) Keturah Barre, MD as Consulting Physician (Otolaryngology) Meryl Dare, MD as Consulting Physician (Gastroenterology) Elliot Cousin, OD as Consulting Physician (Optometry) Sheran Luz, MD as Consulting Physician (Physical Medicine and Rehabilitation) Ranee Gosselin, MD as Consulting Physician (Orthopedic Surgery) [place] Chilton Si, MD as Attending Physician (Cardiology) Sherrie George, MD as Consulting Physician (Ophthalmology) Mast, Man X, NP as Nurse Practitioner (Internal Medicine)  Extended Emergency Contact Information Primary Emergency Contact: Gwenlyn Fudge of [place] Home Phone: [phone] Mobile Phone: [phone] Relation: Son Secondary Emergency Contact: Towanda Malkin [place] of [place] Mobile Phone: [phone] Relation: Other  Code Status:  Goals of Care: Advanced Directive information    10/25/2022   12:35 PM  Advanced Directives  Does Patient Have a Medical Advance Directive? Yes  Type of Estate agent of [place];Living will  Does patient want to make changes to medical advance directive? No - Patient declined  Copy of Healthcare Power of Attorney in Chart? Yes - validated most recent copy scanned in chart (See row information)      No chief complaint on file.   HPI: Patient is a 89 y.o. female seen today for acute visit for weakness. Staff nurse reported that pt is unable to ambulate , she is holding on to the wall, staff got her and assister her to wheel chair.  She was seen walking with walker this morning.   Reportedly pt had 2 unwitnessed falls and noticed worsening SOB this afternoon.  Pt knows her name, states she cannot move her legs and feels weak. Pt denies chest pain, palpitations, dizzy or lightheadedness, abdominal pain, nausea, vomiting, dysuria. Staff did the covid test which is negative As per nursing staff pt is slower from her baseline   Past Medical History:  Diagnosis Date   Acute upper respiratory infections of unspecified site 2013   Cellulitis and abscess of hand, except fingers and thumb 10/04/2010   Degeneration of intervertebral disc, site unspecified 2000   Diaphragmatic hernia without mention of obstruction or gangrene 08/15/1998   Diverticulosis of colon (without mention of hemorrhage) 2000   Hearing loss 09/28/2015   Hyperlipidemia 2007   Hypertension 2007   Impacted cerumen    Loss of weight 04/25/2011   Myalgia and myositis, unspecified 01/07/2007   Nontoxic uninodular goiter 08/15/1998   Osteoarthrosis, unspecified whether generalized or localized, unspecified site 07/22/2000   Osteoporosis 2000   Other drug allergy(995.27) 04/04/2011   Pain in limb 2012   Personal history of fall 2012   Pulmonary hypertension (HCC) 07/25/14   Mild-moderate   Spinal stenosis, unspecified region other than cervical 2002   Sprain of ankle, left    Supraventricular premature beats 10/2006   Tricuspid regurgitation 07/25/14   Mild-moderate   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 Lynnette Caffey   CATARACT EXTRACTION W/ INTRAOCULAR LENS IMPLANT Right 05/19/2014  Dr. Dione Booze   COLONOSCOPY  1992   acute segmental colitis Dr. Russella Dar   FACIAL COSMETIC SURGERY  1995   Dr. Charolotte Eke   IR KYPHO LUMBAR INC FX REDUCE BONE BX UNI/BIL  CANNULATION INC/IMAGING  04/18/2020   LARYNGOSCOPY  1987   and biopsy  Dr. Haroldine Laws    reports that she has never smoked. She has never used smokeless tobacco. She reports that she does not drink alcohol and does not use drugs. Social History   Socioeconomic History   Marital status: Widowed    Spouse name: Not on file   Number of children: Not on file   Years of education: Not on file   Highest education level: Not on file  Occupational History   Occupation: retired Film/video editor  Tobacco Use   Smoking status: Never   Smokeless tobacco: Never  Vaping Use   Vaping status: Never Used  Substance and Sexual Activity   Alcohol use: No   Drug use: No   Sexual activity: Never  Other Topics Concern   Not on file  Social History Narrative   Lives at [facility] [place] since 7/[address]   Widowed   Never smoke   No alcohol   Exercise none   Walks with walker   Living Will , POA          Social Determinants of Health   Financial Resource Strain: Not on file  Food Insecurity: Not on file  Transportation Needs: Not on file  Physical Activity: Not on file  Stress: Not on file  Social Connections: Not on file  Intimate Partner Violence: Not on file    Functional Status Survey:    Family History  Problem Relation Age of Onset   Stroke Mother    Stroke Father    Hypertension Brother    Rheum arthritis Maternal Grandmother    Pancreatic cancer Sister     Health Maintenance  Topic Date Due   DTaP/Tdap/Td (2 - Tdap) 04/24/2021   INFLUENZA VACCINE  10/10/2022   COVID-19 Vaccine (4 - 2023-24 season) 11/10/2022   DEXA SCAN  02/06/2023 (Originally 09/02/1991)   Medicare Annual Wellness (AWV)  10/30/2023   Pneumonia Vaccine 39+ Years old (3 of 3 - PPSV23 or PCV20) 12/10/2023   Zoster Vaccines- Shingrix  Completed   HPV VACCINES  Aged Out    Allergies  Allergen Reactions   Doxycycline Itching   Lipitor [Atorvastatin] Other (See Comments)    PAIN IN LEGS   Pantoprazole  Itching   Pepcid [Famotidine] Itching    Outpatient Encounter Medications as of 12/16/2022  Medication Sig   cholecalciferol (VITAMIN D) 1000 UNITS tablet Take 1,000 Units by mouth daily.    docusate sodium (COLACE) 100 MG capsule Take 100 mg by mouth 2 (two) times daily as needed for mild constipation.   HYDROcodone-acetaminophen (NORCO/VICODIN) 5-325 MG per tablet Take 1 tablet by mouth 3 (three) times daily as needed for moderate pain.   Magnesium 250 MG TABS Take 250 mg by mouth daily at 2 PM.    metoprolol succinate (TOPROL-XL) 25 MG 24 hr tablet TAKE 1 TABLET ONCE DAILY.   rosuvastatin (CRESTOR) 5 MG tablet TAKE ONE TABLET BY MOUTH DAILY TO LOWER CHOLESTEROL   No facility-administered encounter medications on file as of 12/16/2022.    Review of Systems  There were no vitals filed for this visit. There is no height or weight on file to calculate BMI. Physical Exam Constitutional:      Appearance: Normal  appearance.  Cardiovascular:     Rate and Rhythm: Normal rate and regular rhythm.     Heart sounds: No murmur heard. Pulmonary:     Breath sounds: Rales (coarse breath sounds) present.  Abdominal:     General: Bowel sounds are normal. There is no distension.     Tenderness: There is no abdominal tenderness. There is no guarding or rebound.  Musculoskeletal:     Comments: Skin tear Rt elbow  Neurological:     Mental Status: She is alert.     Sensory: No sensory deficit.     Comments: Unable to do finger nose Not able to follow my finger when assessing her for Extra ocular muscles Strength slightly decreased on right upper extremity when compared to left  Lower legs strength intact      Labs reviewed: Basic Metabolic Panel: Recent Labs    05/07/22 0630  NA 142  K 4.0  CL 103  CO2 29  GLUCOSE 84  BUN 27*  CREATININE 1.25*  CALCIUM 9.6   Liver Function Tests: Recent Labs    05/07/22 0630  AST 15  ALT 6  BILITOT 0.7  PROT 6.6   No results for input(s):  "LIPASE", "AMYLASE" in the last 8760 hours. No results for input(s): "AMMONIA" in the last 8760 hours. CBC: Recent Labs    05/07/22 0630  WBC 7.6  HGB 13.4  HCT 40.4  MCV 87.8  PLT 267   Cardiac Enzymes: No results for input(s): "CKTOTAL", "CKMB", "CKMBINDEX", "TROPONINI" in the last 8760 hours. BNP: Invalid input(s): "POCBNP" Lab Results  Component Value Date   HGBA1C 5.7 (H) 07/25/2014   Lab Results  Component Value Date   TSH 1.52 11/10/2018   Lab Results  Component Value Date   VITAMINB12 1122 (H) 09/19/2010   Lab Results  Component Value Date   FOLATE >20.0 09/19/2010   Lab Results  Component Value Date   IRON 18 (L) 09/19/2010   TIBC 207 (L) 09/19/2010   FERRITIN 62 12/09/2016    Imaging and Procedures obtained prior to SNF admission: IR KYPHO LUMBAR INC FX REDUCE BONE BX UNI/BIL CANNULATION INC/IMAGING  Result Date: 04/20/2020 INDICATION: Severe low back pain secondary to compression fracture at L2. EXAM: BALLOON KYPHOPLASTY AT L2 COMPARISON:  MRI of the lumbosacral spine of March 23, 2020. MEDICATIONS: As antibiotic prophylaxis, Ancef 2 g IV was ordered pre-procedure and administered intravenously within 1 hour of incision. ANESTHESIA/SEDATION: Moderate (conscious) sedation was employed during this procedure. A total of Versed 2 mg and Fentanyl 100 mcg was administered intravenously. Moderate Sedation Time: 45 minutes. The patient's level of consciousness and vital signs were monitored continuously by radiology nursing throughout the procedure under my direct supervision. FLUOROSCOPY TIME:  Fluoroscopy Time: 19 minutes 6 seconds (6909 mGy) COMPLICATIONS: None immediate. PROCEDURE: Following a full explanation of the procedure along with the potential associated complications, an informed witnessed consent was obtained. The patient was placed prone on the fluoroscopic table. The skin overlying the lumbar region was then prepped and draped in the usual sterile  fashion. Both pedicles at L2 were then infiltrated with 0.25% bupivacaine followed by the advancement of an 11-gauge Jamshidi needle through each pedicle into the posterior one-third at L2. These were then exchanged for a Kyphon advanced osteo introducer system comprised of a working cannula and a Kyphon osteo drill. The combinations were then advanced over a Kyphon osteo bone pin until the tip of the Kyphon osteo drill was in the posterior third at  L2. At this time, the bone pins were removed. In a medial trajectory, the combination was advanced until the tips of the working cannulae were inside the posterior one-third at L2. The osteo drills were removed and core samples sent for pathologic analysis. Through the working cannulae, a Kyphon bone biopsy device was advanced to within 5 mm of the anterior aspect of L2. A core sample from this was also sent for pathologic analysis. Through the working cannulae, a Kyphon inflatable bone tamp 20 x 3 were advanced and positioned with the distal marker 5 mm from the anterior aspect of L2. Crossing of the midline was seen on the AP projection. At this time, the balloons were expanded using contrast via a Kyphon inflation syringe device via microtubing. Inflations were continued until there was apposition with the superior endplate. At this time, methylmethacrylate mixture was reconstituted with Tobramycin in the Kyphon bone mixing device system. This was then loaded onto the Kyphon bone fillers. The balloon was deflated and removed followed by the instillation of 5 bone filler equivalents of methylmethacrylate mixture at L2 with excellent filling in the AP and lateral projections. No extravasation was noted in the disk spaces or posteriorly into the spinal canal. Small amount of contrast was seen extruding in the right paravertebral vein on the right at L2. This remained stable throughout the procedure. The working cannulae and the bone fillers were then retrieved and  removed. Hemostasis was achieved at the skin entry sites. IMPRESSION: 1. Status post fluoroscopic-guided needle placement for deep core bone biopsy at L2. 2. Status post vertebral body augmentation using balloon kyphoplasty at L2 as described without event. Patient and son informed to check with the referring MD regarding results of the biopsy. They expressed understanding and agreement with the above management plan. Electronically Signed   By: Julieanne Cotton M.D.   On: 04/19/2020 09:24    Assessment/Plan  1. Muscle weakness (generalized) 2. Unwitnessed fall Pt with 2 unwitnessed falls over the weekend She was able to walk with rolater walker this morning  Pt reports weak and unable to walk  this  after noon  Last seen normal in the morning,  Denies pain  Increased confusion from baseline as per staff  Has skin tear near Rt elbow  Pt noted with coarse breath sounds, o2 sat 97% ob RA Will send her to ED for further eval    Family/ staff Communication:  care plan discussed with the nursing staff      I spent greater than  45 minutes for the care of this patient in face to face time, chart review, clinical documentation, patient education.

## 2022-12-16 NOTE — ED Provider Notes (Signed)
[facility] EMERGENCY DEPARTMENT AT [facility] Provider Note   CSN: [id number] Arrival date & time: 12/16/22  1405     History  Chief Complaint  Patient presents with   Weakness    [name] is a 89 y.o. female.  Patient brought in by EMS from friend's home.  Patient unwitnessed fall on Saturday no loss of consciousness.  Patient not on blood thinners.  But since then she has had unsteady gait says she needs to cannot hold onto the walls because she feels off balance.  But denies any pain to her neck back upper arms or lower extremities.  No headache.  Patient's blood sugar was 104 by EMS.  Temp here 98.6 blood pressure 174/91 and pulse of 68 respirations 18 and room air sats are 98%.  Patient is very alert for her age.  Past medical history significant for urinary tract infections myalgia hyperlipidemia hypertension tricuspid regurgitation diagnosed in 2016 pulmonary hypertension diagnosed in 2016.  Past surgical history facial cosmetic surgery.  Cataract surgery patient never used tobacco products.  Patient last seen in the emergency department for palpitations in 2017.  In addition patient denies any room spinning or dizziness.       Home Medications Prior to Admission medications   Medication Sig Start Date End Date Taking? Authorizing Provider  cholecalciferol (VITAMIN D) 1000 UNITS tablet Take 1,000 Units by mouth daily.     [provider]  docusate sodium (COLACE) 100 MG capsule Take 100 mg by mouth 2 (two) times daily as needed for mild constipation.    [provider]  HYDROcodone-acetaminophen (NORCO/VICODIN) 5-325 MG per tablet Take 1 tablet by mouth 3 (three) times daily as needed for moderate pain. 06/17/12   [provider]  Magnesium 250 MG TABS Take 250 mg by mouth daily at 2 PM.     [provider]  metoprolol succinate (TOPROL-XL) 25 MG 24 hr tablet TAKE 1 TABLET ONCE DAILY. 03/07/20   Chilton Si, MD   rosuvastatin (CRESTOR) 5 MG tablet TAKE ONE TABLET BY MOUTH DAILY TO LOWER CHOLESTEROL 10/07/22   Frederica Kuster, MD      Allergies    Doxycycline, Lipitor [atorvastatin], Pantoprazole, and Pepcid [famotidine]    Review of Systems   Review of Systems  Constitutional:  Negative for chills and fever.  HENT:  Negative for ear pain and sore throat.   Eyes:  Negative for pain and visual disturbance.  Respiratory:  Negative for cough and shortness of breath.   Cardiovascular:  Negative for chest pain and palpitations.  Gastrointestinal:  Negative for abdominal pain and vomiting.  Genitourinary:  Negative for dysuria and hematuria.  Musculoskeletal:  Negative for arthralgias and back pain.  Skin:  Negative for color change and rash.  Neurological:  Negative for dizziness, seizures, syncope, numbness and headaches.  All other systems reviewed and are negative.   Physical Exam Updated Vital Signs BP (!) 174/91 (BP Location: Left Arm)   Pulse 68   Temp 98.6 F (37 C) (Oral)   Resp 18   SpO2 98%  Physical Exam Vitals and nursing note reviewed.  Constitutional:      General: She is not in acute distress.    Appearance: Normal appearance. She is well-developed.  HENT:     Head: Normocephalic and atraumatic.     Mouth/Throat:     Mouth: Mucous membranes are moist.  Eyes:     Extraocular Movements: Extraocular movements intact.  Conjunctiva/sclera: Conjunctivae normal.     Pupils: Pupils are equal, round, and reactive to light.  Cardiovascular:     Rate and Rhythm: Normal rate and regular rhythm.     Heart sounds: No murmur heard. Pulmonary:     Effort: Pulmonary effort is normal. No respiratory distress.     Breath sounds: Normal breath sounds.  Abdominal:     Palpations: Abdomen is soft.     Tenderness: There is no abdominal tenderness.  Musculoskeletal:        General: No swelling, tenderness or signs of injury.     Cervical back: Normal range of motion and neck  supple.     Comments: No tenderness to palpation to upper back lower back.  Upper extremities lower extremities without evidence of any injury.  Good strength good cap refill distally.  Sensation intact.  Skin:    General: Skin is warm and dry.     Capillary Refill: Capillary refill takes less than 2 seconds.  Neurological:     General: No focal deficit present.     Mental Status: She is alert and oriented to person, place, and time.     Cranial Nerves: No cranial nerve deficit.     Sensory: No sensory deficit.     Motor: No weakness.     Comments: Patient very alert.  Patient without any neurodeficits while on the stretcher.  No lower extremity weakness no upper extremity weakness.  Finger-to-nose is intact.  Extraocular muscles intact  Psychiatric:        Mood and Affect: Mood normal.     ED Results / Procedures / Treatments   Labs (all labs ordered are listed, but only abnormal results are displayed) Labs Reviewed  COMPREHENSIVE METABOLIC PANEL - Abnormal; Notable for the following components:      Result Value   Sodium 134 (*)    Glucose, Bld 101 (*)    BUN 24 (*)    Creatinine, Ser 1.22 (*)    Calcium 8.8 (*)    Total Protein 6.2 (*)    GFR, Estimated 41 (*)    All other components within normal limits  CBC WITH DIFFERENTIAL/PLATELET  URINALYSIS, ROUTINE W REFLEX MICROSCOPIC    EKG EKG Interpretation Date/Time:  Monday December 16 2022 14:21:07 EDT Ventricular Rate:  72 PR Interval:  172 QRS Duration:  72 QT Interval:  406 QTC Calculation: 444 R Axis:   22  Text Interpretation: Normal sinus rhythm Low voltage QRS Nonspecific ST abnormality Abnormal ECG When compared with ECG of 29-Apr-2015 20:25, PREVIOUS ECG IS PRESENT Artifact Confirmed by Vanetta Mulders [phone]) on 12/16/2022 3:47:43 PM  Radiology No results found.  Procedures Procedures    Medications Ordered in ED Medications - No data to display  ED Course/ Medical Decision Making/ A&P                                  Medical Decision Making Amount and/or Complexity of Data Reviewed Labs: ordered. Radiology: ordered.   On exam no evidence of any focal deficits.  But did not walk patient.  Some concern that she may have had a stroke on Saturday which resulted in her original fall.  Will get CT head will get chest x-ray.  Have ordered labs.  CBC no leukocytosis white count 8.2 hemoglobin 12.5 platelets are 261 complete metabolic panel sodium down a little bit at 134 potassium normal.  Glucose 101 GFR  is 41 on creatinine is 1.22 anion gap 8.  X-ray of the chest and CT head are still pending.  Patient turned over to evening ED physician.  Feel that patient's head CT negative she needs an MRI.  Have also ordered a urinalysis on her which is pending.   Final Clinical Impression(s) / ED Diagnoses Final diagnoses:  Generalized weakness  Abnormal gait    Rx / DC Orders ED Discharge Orders     None         Vanetta Mulders, MD 12/16/22 1636

## 2022-12-16 NOTE — ED Provider Notes (Signed)
89 yo female presenting with unwitnessed fall 2 days ago, difficulty walking due to new balance issues, unsteady gait, no pelvic or leg weakness  Pending CT head, potential MRI for evaluation of cerebellar lesion  Supplement history provided the patient's son by phone.  He reports the patient is living in assisted living.  Typically she is able to ambulate without assistance.  The patient's son confirms to me that the patient is only taking Crestor, occasional oxycodone for pain, no other medications.  He denies that she has had any issues with GI bleeding that he is aware of.  Physical Exam  BP (!) 160/80 (BP Location: Left Arm)   Pulse 76   Temp 97.9 F (36.6 C) (Oral)   Resp 16   SpO2 99%   Physical Exam  Procedures  Procedures  ED Course / MDM   Clinical Course as of 12/16/22 2042  Mon Dec 16, 2022  2005 I placed consult to teleneurology [MT]  2013 Dr Iver Nestle agrees to plan for medical admission for stroke evaluation.  Could consider initiating aspirin 81 mg of Plavix 75 mg unless there are contraindications to these medications, including GI bleed. [MT]  2039 Admitted to hospitalist Dr Phoebe Perch, son updated by phone [MT]    Clinical Course User Index [MT] Tadhg Eskew, Kermit Balo, MD   Medical Decision Making Amount and/or Complexity of Data Reviewed Labs: ordered. Radiology: ordered.  Risk Decision regarding hospitalization.         Terald Sleeper, MD 12/16/22 2042

## 2022-12-16 NOTE — H&P (Addendum)
History and Physical    Patient: [name] DOB: [birth year] DOA: 12/16/2022 DOS: the patient was seen and examined on 12/16/2022 PCP: Venita Sheffield, MD  Patient coming from: ALF/ILF  Chief Complaint:  Chief Complaint  Patient presents with   Weakness   HPI: [name] is a 89 y.o. female with medical history significant of symptoms of assisted living facility with fairly good functional status, almost independent.  Patient was noted by staff at the living facility for the last 2 or 3 days to have a gait disturbance/unsteadiness.  There has been no fall in the last 2 or 3 days, although patient did have a fall about a week ago with some injury to the right elbow skin area.  Patient has had no fever nausea vomiting diarrhea or headache.  Patient was sent to the ER for further evaluation of her gait disturbance.  Patient does not report a gait disturbance herself, reports no trouble swallowing no trouble urinating or having a bowel movement.  No trouble with speech is reported.  Workup in the ER has revealed an acute infarction.  Medical evaluation is sought Review of Systems: As mentioned in the history of present illness. All other systems reviewed and are negative. Past Medical History:  Diagnosis Date   Acute upper respiratory infections of unspecified site 2013   Cellulitis and abscess of hand, except fingers and thumb 10/04/2010   Degeneration of intervertebral disc, site unspecified 2000   Diaphragmatic hernia without mention of obstruction or gangrene 08/15/1998   Diverticulosis of colon (without mention of hemorrhage) 2000   Hearing loss 09/28/2015   Hyperlipidemia 2007   Hypertension 2007   Impacted cerumen    Loss of weight 04/25/2011   Myalgia and myositis, unspecified 01/07/2007   Nontoxic uninodular goiter 08/15/1998   Osteoarthrosis, unspecified whether generalized or localized, unspecified site 07/22/2000   Osteoporosis 2000   Other drug  allergy(995.27) 04/04/2011   Pain in limb 2012   Personal history of fall 2012   Pulmonary hypertension (HCC) 07/25/14   Mild-moderate   Spinal stenosis, unspecified region other than cervical 2002   Sprain of ankle, left    Supraventricular premature beats 10/2006   Tricuspid regurgitation 07/25/14   Mild-moderate   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 Lynnette Caffey   CATARACT EXTRACTION W/ INTRAOCULAR LENS IMPLANT Right 05/19/2014   Dr. Dione Booze   COLONOSCOPY  1992   acute segmental colitis Dr. Russella Dar   FACIAL COSMETIC SURGERY  1995   Dr. Charolotte Eke   IR KYPHO LUMBAR INC FX REDUCE BONE BX UNI/BIL CANNULATION INC/IMAGING  04/18/2020   LARYNGOSCOPY  1987   and biopsy  Dr. Haroldine Laws   Social History:  reports that she has never smoked. She has never used smokeless tobacco. She reports that she does not drink alcohol and does not use drugs.  Allergies  Allergen Reactions   Doxycycline Itching   Lipitor [Atorvastatin] Other (See Comments)    PAIN IN LEGS   Pantoprazole Itching   Pepcid [Famotidine] Itching    Family History  Problem Relation Age of Onset   Stroke Mother    Stroke Father    Hypertension Brother    Rheum arthritis Maternal Grandmother    Pancreatic  cancer Sister     Prior to Admission medications   Medication Sig Start Date End Date Taking? Authorizing Provider  cholecalciferol (VITAMIN D) 1000 UNITS tablet Take 1,000 Units by mouth daily.     [provider]  docusate sodium (COLACE) 100 MG capsule Take 100 mg by mouth 2 (two) times daily as needed for mild constipation.    [provider]  HYDROcodone-acetaminophen (NORCO/VICODIN) 5-325 MG per tablet Take 1 tablet by mouth 3 (three) times  daily as needed for moderate pain. 06/17/12   [provider]  Magnesium 250 MG TABS Take 250 mg by mouth daily at 2 PM.     [provider]  metoprolol succinate (TOPROL-XL) 25 MG 24 hr tablet TAKE 1 TABLET ONCE DAILY. 03/07/20   Chilton Si, MD  rosuvastatin (CRESTOR) 5 MG tablet TAKE ONE TABLET BY MOUTH DAILY TO LOWER CHOLESTEROL 10/07/22   Frederica Kuster, MD    Physical Exam: Vitals:   12/16/22 1425 12/16/22 2003  BP: (!) 174/91 (!) 160/80  Pulse: 68 76  Resp: 18 16  Temp: 98.6 F (37 C) 97.9 F (36.6 C)  TempSrc: Oral Oral  SpO2: 98% 99%   general: Patient appears to be in no distress patient gives the history herself and is reasonably coherent. Respiratory exam: Bilateral intravesicular Cardiovascular exam S1-S2 normal Abdomen all quadrants are soft nontender Extremities warm without edema No focal motor deficit could be identified.  However per report patient was earlier ambulated and noted to have a left-sided drift. Data Reviewed:  Labs on Admission:  Results for orders placed or performed during the hospital encounter of 12/16/22 (from the past 24 hour(s))  CBC with Differential/Platelet     Status: None   Collection Time: 12/16/22  2:47 PM  Result Value Ref Range   WBC 8.2 4.0 - 10.5 K/uL   RBC 4.18 3.87 - 5.11 MIL/uL   Hemoglobin 12.5 12.0 - 15.0 g/dL   HCT 09.8 11.9 - 14.7 %   MCV 91.9 80.0 - 100.0 fL   MCH 29.9 26.0 - 34.0 pg   MCHC 32.6 30.0 - 36.0 g/dL   RDW 82.9 56.2 - 13.0 %   Platelets 261 150 - 400 K/uL   nRBC 0.0 0.0 - 0.2 %   Neutrophils Relative % 59 %   Neutro Abs 4.9 1.7 - 7.7 K/uL   Lymphocytes Relative 31 %   Lymphs Abs 2.5 0.7 - 4.0 K/uL   Monocytes Relative 8 %   Monocytes Absolute 0.7 0.1 - 1.0 K/uL   Eosinophils Relative 1 %   Eosinophils Absolute 0.1 0.0 - 0.5 K/uL   Basophils Relative 1 %   Basophils Absolute 0.1 0.0 - 0.1 K/uL   Immature Granulocytes 0 %   Abs Immature Granulocytes 0.03 0.00 - 0.07 K/uL   Comprehensive metabolic panel     Status: Abnormal   Collection Time: 12/16/22  2:47 PM  Result Value Ref Range   Sodium 134 (L) 135 - 145 mmol/L   Potassium 4.5 3.5 - 5.1 mmol/L   Chloride 101 98 - 111 mmol/L   CO2 25 22 - 32 mmol/L   Glucose, Bld 101 (H) 70 - 99 mg/dL   BUN 24 (H) 8 - 23 mg/dL   Creatinine, Ser 8.65 (H) 0.44 - 1.00 mg/dL   Calcium 8.8 (L) 8.9 - 10.3 mg/dL   Total Protein 6.2 (L) 6.5 - 8.1 g/dL   Albumin 3.5 3.5 - 5.0 g/dL   AST 17 15 -  41 U/L   ALT 11 0 - 44 U/L   Alkaline Phosphatase 51 38 - 126 U/L   Total Bilirubin 0.6 0.3 - 1.2 mg/dL   GFR, Estimated 41 (L) >60 mL/min   Anion gap 8 5 - 15  Urinalysis, Routine w reflex microscopic -Urine, Clean Catch     Status: Abnormal   Collection Time: 12/16/22  8:12 PM  Result Value Ref Range   Color, Urine STRAW (A) YELLOW   APPearance CLEAR CLEAR   Specific Gravity, Urine 1.010 1.005 - 1.030   pH 7.0 5.0 - 8.0   Glucose, UA NEGATIVE NEGATIVE mg/dL   Hgb urine dipstick NEGATIVE NEGATIVE   Bilirubin Urine NEGATIVE NEGATIVE   Ketones, ur NEGATIVE NEGATIVE mg/dL   Protein, ur NEGATIVE NEGATIVE mg/dL   Nitrite NEGATIVE NEGATIVE   Leukocytes,Ua TRACE (A) NEGATIVE   RBC / HPF 0-5 0 - 5 RBC/hpf   WBC, UA 6-10 0 - 5 WBC/hpf   Bacteria, UA NONE SEEN NONE SEEN   Squamous Epithelial / HPF 0-5 0 - 5 /HPF   Basic Metabolic Panel: Recent Labs  Lab 12/16/22 1447  NA 134*  K 4.5  CL 101  CO2 25  GLUCOSE 101*  BUN 24*  CREATININE 1.22*  CALCIUM 8.8*   Liver Function Tests: Recent Labs  Lab 12/16/22 1447  AST 17  ALT 11  ALKPHOS 51  BILITOT 0.6  PROT 6.2*  ALBUMIN 3.5   No results for input(s): "LIPASE", "AMYLASE" in the last 168 hours. No results for input(s): "AMMONIA" in the last 168 hours. CBC: Recent Labs  Lab 12/16/22 1447  WBC 8.2  NEUTROABS 4.9  HGB 12.5  HCT 38.4  MCV 91.9  PLT 261   Cardiac Enzymes: No results for input(s): "CKTOTAL", "CKMB", "CKMBINDEX", "TROPONINIHS" in the last 168  hours.  BNP (last 3 results) No results for input(s): "PROBNP" in the last 8760 hours. CBG: No results for input(s): "GLUCAP" in the last 168 hours.  Radiological Exams on Admission:  MR BRAIN WO CONTRAST  Result Date: 12/16/2022 CLINICAL DATA:  Initial evaluation for acute TIA. EXAM: MRI HEAD WITHOUT CONTRAST TECHNIQUE: Multiplanar, multiecho pulse sequences of the brain and surrounding structures were obtained without intravenous contrast. COMPARISON:  Prior study from 10/05/2006. FINDINGS: Brain: Generalized age-related cerebral atrophy. Patchy and confluent T2/FLAIR hyperintensity involving the periventricular and deep white matter both cerebral hemispheres as well as the pons, consistent with chronic small vessel ischemic disease, moderately advanced in nature. Few scatter remote lacunar infarcts present about the deep gray nuclei. Small remote right cerebellar infarct noted. She Patchy small volume restricted diffusion involving the posterior left basal ganglia/corona radiata, consistent with acute ischemic infarcts (series 5, images 28-30). No associated hemorrhage or mass effect. No other evidence for acute or subacute ischemia. No acute intracranial hemorrhage. Single punctate chronic microhemorrhage noted at the left thalamus. No mass lesion, midline shift or mass effect. No hydrocephalus or extra-axial fluid collection. Pituitary gland and suprasellar region within normal limits. Vascular: Major intracranial vascular flow voids are maintained. Skull and upper cervical spine: Craniocervical junction with normal limits. Bone marrow signal intensity normal. No scalp soft tissue abnormality. Sinuses/Orbits: Prior bilateral ocular lens replacement. Paranasal sinuses are clear. Moderate bilateral mastoid effusions noted, of doubtful significance. Visualized nasopharynx unremarkable. Other: None. IMPRESSION: 1. Patchy small volume acute ischemic nonhemorrhagic infarct involving the posterior left  basal ganglia/corona radiata. 2. Underlying age-related cerebral atrophy with moderate chronic microvascular ischemic disease, with a few scattered remote lacunar infarcts about  the deep gray nuclei and right cerebellum. Electronically Signed   By: Rise Mu M.D.   On: 12/16/2022 19:12   DG Chest 1 View  Result Date: 12/16/2022 CLINICAL DATA:  Fall EXAM: CHEST  1 VIEW COMPARISON:  10/12/2015 FINDINGS: Mild left basilar atelectasis. Lungs are otherwise clear. No pneumothorax or pleural effusion. Cardiac size within normal limits. Pulmonary vascularity is normal. No acute bone abnormality. IMPRESSION: No active disease. Electronically Signed   By: Helyn Numbers M.D.   On: 12/16/2022 17:14    EKG: Independently reviewed. Artifact. NSR no ST-T wave changes.   Assessment and Plan: Stroke (cerebrum) (HCC) Patchy small several. See mri report. Left sided. Patient to be started on aspirin, 21 days of Plavix.  Patient is intolerant of atorvastatin, I will continue with Crestor or pravastatin.  Stroke workup including telemetry echo neurochecks have been ordered.  Neurology was engaged by ER provider thank .  Hold off on antihypertensives unless the blood pressure crosses 190-minute mercury. US carotid ordred. Swallow screenig.  Home medication reconciliation pending home med collection    Advance Care Planning:   Code Status: Full Code   Consults: neurology engaged by ER provider  Family Communication: updated by ER provider.  Severity of Illness: The appropriate patient status for this patient is INPATIENT. Inpatient status is judged to be reasonable and necessary in order to provide the required intensity of service to ensure the patient's safety. The patient's presenting symptoms, physical exam findings, and initial radiographic and laboratory data in the context of their chronic comorbidities is felt to place them at high risk for further clinical deterioration. Furthermore, it is not  anticipated that the patient will be medically stable for discharge from the hospital within 2 midnights of admission.   * I certify that at the point of admission it is my clinical judgment that the patient will require inpatient hospital care spanning beyond 2 midnights from the point of admission due to high intensity of service, high risk for further deterioration and high frequency of surveillance required.*  Author: Nolberto Hanlon, MD 12/16/2022 9:04 PM  For on call review [email or web address].

## 2022-12-16 NOTE — Assessment & Plan Note (Addendum)
Patchy small several. See mri report. Left sided. Patient to be started on aspirin, 21 days of Plavix.  Patient is intolerant of atorvastatin, I will continue with Crestor or pravastatin.  Stroke workup including telemetry echo neurochecks have been ordered.  Neurology was engaged by ER provider thank .  Hold off on antihypertensives unless the blood pressure crosses 190-minute mercury. US carotid ordred. Swallow screenig.

## 2022-12-16 NOTE — ED Triage Notes (Signed)
Pt BIB EMS from [facility], unwitnessed fall Saturday, no LOC, thinners, or fracture. Since then pt has increased weakness, unsteady gait, and clutching walls and furniture for support. Rhonchi noted on right side.   BP 136/72 P 70 RR16 SpO2 95% CBG 104

## 2022-12-17 ENCOUNTER — Inpatient Hospital Stay (HOSPITAL_COMMUNITY): Payer: Medicare Other

## 2022-12-17 ENCOUNTER — Other Ambulatory Visit: Payer: Self-pay

## 2022-12-17 DIAGNOSIS — I639 Cerebral infarction, unspecified: Secondary | ICD-10-CM | POA: Diagnosis not present

## 2022-12-17 DIAGNOSIS — R269 Unspecified abnormalities of gait and mobility: Secondary | ICD-10-CM | POA: Diagnosis not present

## 2022-12-17 DIAGNOSIS — I6389 Other cerebral infarction: Secondary | ICD-10-CM

## 2022-12-17 DIAGNOSIS — R0989 Other specified symptoms and signs involving the circulatory and respiratory systems: Secondary | ICD-10-CM | POA: Diagnosis not present

## 2022-12-17 DIAGNOSIS — I63412 Cerebral infarction due to embolism of left middle cerebral artery: Secondary | ICD-10-CM

## 2022-12-17 DIAGNOSIS — R531 Weakness: Secondary | ICD-10-CM | POA: Diagnosis not present

## 2022-12-17 LAB — ECHOCARDIOGRAM COMPLETE
Area-P 1/2: 3.39 cm2
Calc EF: 64.2 %
MV M vel: 4.93 m/s
MV Peak grad: 97.2 mm[Hg]
MV VTI: 2.83 cm2
S' Lateral: 2.5 cm
Single Plane A2C EF: 60.3 %
Single Plane A4C EF: 68.8 %
Weight: 1747.2 [oz_av]

## 2022-12-17 LAB — HEMOGLOBIN A1C
Hgb A1c MFr Bld: 5.6 % (ref 4.8–5.6)
Mean Plasma Glucose: 114.02 mg/dL

## 2022-12-17 LAB — LIPID PANEL
Cholesterol: 133 mg/dL (ref 0–200)
HDL: 56 mg/dL (ref >40)
LDL Cholesterol: 64 mg/dL (ref 0–99)
Total CHOL/HDL Ratio: 2.4 {ratio}
Triglycerides: 64 mg/dL (ref <150)
VLDL: 13 mg/dL (ref 0–40)

## 2022-12-17 LAB — VITAMIN B12: Vitamin B-12: 142 pg/mL — ABNORMAL LOW (ref 180–914)

## 2022-12-17 LAB — SEDIMENTATION RATE: Sed Rate: 8 mm/h (ref 0–22)

## 2022-12-17 LAB — MRSA NEXT GEN BY PCR, NASAL: MRSA by PCR Next Gen: NOT DETECTED

## 2022-12-17 LAB — C-REACTIVE PROTEIN: CRP: 0.5 mg/dL (ref <1.0)

## 2022-12-17 MED ORDER — ASPIRIN 81 MG PO TBEC
81.0000 mg | DELAYED_RELEASE_TABLET | Freq: Every day | ORAL | Status: DC
  Administered 2022-12-17 – 2022-12-20 (×4): 81 mg via ORAL
  Filled 2022-12-17 (×4): qty 1

## 2022-12-17 NOTE — Hospital Course (Signed)
89 y.o. female with medical history significant of symptoms of assisted living facility with fairly good functional status, almost independent.  Patient was noted by staff at the living facility for the last 2 or 3 days to have a gait disturbance/unsteadiness.  There has been no fall in the last 2 or 3 days, although patient did have a fall about a week ago with some injury to the right elbow skin area.  Patient has had no fever nausea vomiting diarrhea or headache.  Patient was sent to the ER for further evaluation of her gait disturbance   Workup in the ER has revealed an acute infarction. Medical evaluation is sought

## 2022-12-17 NOTE — ED Notes (Signed)
[facility] called. 

## 2022-12-17 NOTE — Evaluation (Signed)
Occupational Therapy Evaluation Patient Details Name: [name] MRN: [id number] DOB: January 24, 1925 Today's Date: 12/17/2022   History of Present Illness [name] is a 89 y.o. female with medical history significant of symptoms of gait distrurbance and recent  fall. Patient  is from  ALF, ambulatory. MR brain:Left basal ganglia/corona radiata stroke, embolic in appearance   Clinical Impression   Patient is a 89 year old female who was admitted for above. Patient was living at ALF prior level. Currently, patient was noted to fatigue quickly with patient having increased flexion in BLE with increased time on feet with mod A to maintain balance in standing and TD to complete transfer back onto stretcher in ED. Patient having a hard time hearing cues form therapist with bilateral hearing aids dead.  Patient was noted to have decreased functional activity tolerance, decreased endurance, decreased standing balance, decreased safety awareness, and decreased knowledge of AD/AE impacting participation in ADLs. Patient will benefit from continued inpatient follow up therapy, <3 hours/day        If plan is discharge home, recommend the following: A lot of help with bathing/dressing/bathroom;Assistance with cooking/housework;Direct supervision/assist for medications management;Assist for transportation;Help with stairs or ramp for entrance;Direct supervision/assist for financial management;A lot of help with walking and/or transfers    Functional Status Assessment  Patient has had a recent decline in their functional status and demonstrates the ability to make significant improvements in function in a reasonable and predictable amount of time.  Equipment Recommendations  None recommended by OT       Precautions / Restrictions Precautions Precautions: Fall Restrictions Weight Bearing Restrictions: No      Mobility Bed Mobility Overal bed mobility: Needs Assistance Bed Mobility: Supine to  Sit, Sit to Supine     Supine to sit: Contact guard Sit to supine: Contact guard assist   General bed mobility comments: patient able to sit self upright and retrun  to supine with  CGA         Balance Overall balance assessment: History of Falls, Needs assistance Sitting-balance support: Feet supported, Bilateral upper extremity supported Sitting balance-Leahy Scale: Fair     Standing balance support: Bilateral upper extremity supported, No upper extremity supported Standing balance-Leahy Scale: Poor Standing balance comment: support to balance after toileting and with ambultion         ADL either performed or assessed with clinical judgement   ADL Overall ADL's : Needs assistance/impaired Eating/Feeding: NPO   Grooming: Minimal assistance;Sitting   Upper Body Bathing: Minimal assistance;Sitting   Lower Body Bathing: Maximal assistance;Sitting/lateral leans   Upper Body Dressing : Minimal assistance;Sitting   Lower Body Dressing: Maximal assistance;Sitting/lateral leans   Toilet Transfer: Moderate assistance;Ambulation Toilet Transfer Details (indicate cue type and reason): patient ws in ED with need to walk to further bathroom as closer one was occupied. patient was noted to have increased flexion in BLE with R greater than L with incresed time on feet with mod A to maintain balance on trip back to stretcher in hallway. patient appeared to have decreased insight to this. Toileting- Clothing Manipulation and Hygiene: Sit to/from stand;Maximal assistance Toileting - Clothing Manipulation Details (indicate cue type and reason): with increased time. cues to keep one UE on walker.             Vision   Vision Assessment?: No apparent visual deficits            Pertinent Vitals/Pain Pain Assessment Pain Assessment: No/denies pain  Extremity/Trunk Assessment Upper Extremity Assessment Upper Extremity Assessment: Overall WFL for tasks assessed;Right hand  dominant   Lower Extremity Assessment Lower Extremity Assessment: LLE deficits/detail;RLE deficits/detail RLE Deficits / Details: active  movement throughout, decreased  clearnace when stepping.  grossly 4/5 hip fflex, knee ext and dorsiflex, ?  sensation due to difficulty FC/HOH LLE Deficits / Details: grossy WFL   Cervical / Trunk Assessment Cervical / Trunk Assessment: Kyphotic   Communication Communication Communication: Difficulty communicating thoughts/reduced clarity of speech;Hearing impairment (hearing aids were dead) Cueing Techniques: Verbal cues   Cognition Arousal: Alert Behavior During Therapy: WFL for tasks assessed/performed Overall Cognitive Status: No family/caregiver present to determine baseline cognitive functioning Area of Impairment: Orientation, Attention, Following commands, Awareness                 Orientation Level: Time, Situation Current Attention Level: Sustained       Awareness: Emergent   General Comments: oriented to WL but not reason, and date, patient in bed in hallway in ED not appropirate for formal cog testing.                Home Living Family/patient expects to be discharged to:: Assisted living   Home Equipment: Rollator (4 wheels)          Prior Functioning/Environment               Mobility Comments: per chart, ambulatory until recent gait disturbance, ADLs Comments: patient did not answer if she needed A with ADLs at ALF with difficulty hearing with hearing aids dead. patient did try to particiapte in toietling tasks with therapist on this date.        OT Problem List: Decreased activity tolerance;Impaired balance (sitting and/or standing);Decreased coordination;Decreased safety awareness;Decreased knowledge of precautions;Decreased knowledge of use of DME or AE      OT Treatment/Interventions: Self-care/ADL training;Therapeutic exercise;DME and/or AE instruction;Therapeutic activities;Patient/family  education;Balance training    OT Goals(Current goals can be found in the care plan section) Acute Rehab OT Goals Patient Stated Goal: to go to bathroom OT Goal Formulation: With patient Time For Goal Achievement: 12/31/22 Potential to Achieve Goals: Fair  OT Frequency: Min 1X/week    Co-evaluation   Reason for Co-Treatment: To address functional/ADL transfers PT goals addressed during session: Mobility/safety with mobility OT goals addressed during session: ADL's and self-care      AM-PAC OT "6 Clicks" Daily Activity     Outcome Measure Help from another person eating meals?: Total (NPO) Help from another person taking care of personal grooming?: A Little Help from another person toileting, which includes using toliet, bedpan, or urinal?: A Lot Help from another person bathing (including washing, rinsing, drying)?: A Lot Help from another person to put on and taking off regular upper body clothing?: A Little Help from another person to put on and taking off regular lower body clothing?: A Lot 6 Click Score: 13   End of Session Equipment Utilized During Treatment: Gait belt;Rolling walker (2 wheels) Nurse Communication: Mobility status  Activity Tolerance: Patient limited by fatigue Patient left: in bed;with call bell/phone within reach (in ED)  OT Visit Diagnosis: Unsteadiness on feet (R26.81);Other abnormalities of gait and mobility (R26.89);Muscle weakness (generalized) (M62.81)                Time: 1610-9604 OT Time Calculation (min): 19 min Charges:  OT General Charges $OT Visit: 1 Visit OT Evaluation $OT Eval Low Complexity: 1 Low  Patsie Mccardle OTR/L, MS Acute Rehabilitation Department Office# [phone]  Selinda Flavin 12/17/2022, 10:22 AM

## 2022-12-17 NOTE — ED Notes (Signed)
ED TO INPATIENT HANDOFF REPORT  Name/Age/Gender [name] 89 y.o. female  Code Status    Code Status Orders  (From admission, onward)           Start     Ordered   12/16/22 2102  Full code  Continuous       Question:  By:  Answer:  Consent: discussion documented in EHR   12/16/22 2103           Code Status History     Date Active Date Inactive Code Status Order ID Comments User Context   04/30/2015 0307 05/01/2015 2017 Full Code [id number]  Ron Parker, MD ED       Home/SNF/Other ALF  Chief Complaint Stroke (cerebrum) [facility]) [I63.9] Stroke [facility] [I63.9]  Level of Care/Admitting Diagnosis ED Disposition     ED Disposition  Admit   Condition  --   Comment  Hospital Area: [facility] [[id number]]  Level of Care: Telemetry Medical [104]  May admit patient to [facility] or [facility] if equivalent level of care is available:: No  Covid Evaluation: Asymptomatic - no recent exposure (last 10 days) testing not required  Diagnosis: Stroke [facility]) [[id number]]  Admitting Physician: Jerald Kief [[id number]]  Attending Physician: Jerald Kief [phone]  Certification:: I certify this patient will need inpatient services for at least 2 midnights          Medical History Past Medical History:  Diagnosis Date   Acute upper respiratory infections of unspecified site 2013   Cellulitis and abscess of hand, except fingers and thumb 10/04/2010   Degeneration of intervertebral disc, site unspecified 2000   Diaphragmatic hernia without mention of obstruction or gangrene 08/15/1998   Diverticulosis of colon (without mention of hemorrhage) 2000   Hearing loss 09/28/2015   Hyperlipidemia 2007   Hypertension 2007   Impacted cerumen    Loss of weight 04/25/2011   Myalgia and myositis, unspecified 01/07/2007   Nontoxic uninodular goiter 08/15/1998   Osteoarthrosis, unspecified whether generalized or localized, unspecified site 07/22/2000   Osteoporosis 2000    Other drug allergy(995.27) 04/04/2011   Pain in limb 2012   Personal history of fall 2012   Pulmonary hypertension (HCC) 07/25/14   Mild-moderate   Spinal stenosis, unspecified region other than cervical 2002   Sprain of ankle, left    Supraventricular premature beats 10/2006   Tricuspid regurgitation 07/25/14   Mild-moderate   Unspecified late effects of cerebrovascular disease 01/01/2006   Urinary tract infection, site not specified    Urine frequency 01/20/2014   Urticaria, unspecified 11/01/2008   Vitamin D deficiency 04/25/2011    Allergies Allergies  Allergen Reactions   Doxycycline Itching   Lipitor [Atorvastatin] Other (See Comments)    PAIN IN LEGS   Pantoprazole Itching   Pepcid [Famotidine] Itching    IV Location/Drains/Wounds Patient Lines/Drains/Airways Status     Active Line/Drains/Airways     Name Placement date Placement time Site Days   Peripheral IV 12/17/22 20 G 1" Anterior;Right Forearm 12/17/22  0932  Forearm  less than 1            Labs/Imaging Results for orders placed or performed during the hospital encounter of 12/16/22 (from the past 48 hour(s))  CBC with Differential/Platelet     Status: None   Collection Time: 12/16/22  2:47 PM  Result Value Ref Range   WBC 8.2 4.0 - 10.5 K/uL   RBC 4.18 3.87 - 5.11 MIL/uL  Hemoglobin 12.5 12.0 - 15.0 g/dL   HCT 11.9 14.7 - 82.9 %   MCV 91.9 80.0 - 100.0 fL   MCH 29.9 26.0 - 34.0 pg   MCHC 32.6 30.0 - 36.0 g/dL   RDW 56.2 13.0 - 86.5 %   Platelets 261 150 - 400 K/uL   nRBC 0.0 0.0 - 0.2 %   Neutrophils Relative % 59 %   Neutro Abs 4.9 1.7 - 7.7 K/uL   Lymphocytes Relative 31 %   Lymphs Abs 2.5 0.7 - 4.0 K/uL   Monocytes Relative 8 %   Monocytes Absolute 0.7 0.1 - 1.0 K/uL   Eosinophils Relative 1 %   Eosinophils Absolute 0.1 0.0 - 0.5 K/uL   Basophils Relative 1 %   Basophils Absolute 0.1 0.0 - 0.1 K/uL   Immature Granulocytes 0 %   Abs Immature Granulocytes 0.03 0.00 - 0.07 K/uL     Comment: Performed at [facility], [address] [place], [place] [address]  Comprehensive metabolic panel     Status: Abnormal   Collection Time: 12/16/22  2:47 PM  Result Value Ref Range   Sodium 134 (L) 135 - 145 mmol/L   Potassium 4.5 3.5 - 5.1 mmol/L   Chloride 101 98 - 111 mmol/L   CO2 25 22 - 32 mmol/L   Glucose, Bld 101 (H) 70 - 99 mg/dL    Comment: Glucose reference range applies only to samples taken after fasting for at least 8 hours.   BUN 24 (H) 8 - 23 mg/dL   Creatinine, Ser 6.29 (H) 0.44 - 1.00 mg/dL   Calcium 8.8 (L) 8.9 - 10.3 mg/dL   Total Protein 6.2 (L) 6.5 - 8.1 g/dL   Albumin 3.5 3.5 - 5.0 g/dL   AST 17 15 - 41 U/L   ALT 11 0 - 44 U/L   Alkaline Phosphatase 51 38 - 126 U/L   Total Bilirubin 0.6 0.3 - 1.2 mg/dL   GFR, Estimated 41 (L) >60 mL/min    Comment: (NOTE) Calculated using the CKD-EPI Creatinine Equation (2021)    Anion gap 8 5 - 15    Comment: Performed at [facility], [address], [place], [place] [address]  Urinalysis, Routine w reflex microscopic -Urine, Clean Catch     Status: Abnormal   Collection Time: 12/16/22  8:12 PM  Result Value Ref Range   Color, Urine STRAW (A) YELLOW   APPearance CLEAR CLEAR   Specific Gravity, Urine 1.010 1.005 - 1.030   pH 7.0 5.0 - 8.0   Glucose, UA NEGATIVE NEGATIVE mg/dL   Hgb urine dipstick NEGATIVE NEGATIVE   Bilirubin Urine NEGATIVE NEGATIVE   Ketones, ur NEGATIVE NEGATIVE mg/dL   Protein, ur NEGATIVE NEGATIVE mg/dL   Nitrite NEGATIVE NEGATIVE   Leukocytes,Ua TRACE (A) NEGATIVE   RBC / HPF 0-5 0 - 5 RBC/hpf   WBC, UA 6-10 0 - 5 WBC/hpf   Bacteria, UA NONE SEEN NONE SEEN   Squamous Epithelial / HPF 0-5 0 - 5 /HPF    Comment: Performed at [facility], [address] [place], [place] [address]  Hemoglobin A1c     Status: None   Collection Time: 12/16/22 10:30 PM  Result Value Ref Range   Hgb A1c MFr Bld 5.6 4.8 - 5.6 %    Comment:  (NOTE) Pre diabetes:          5.7%-6.4%  Diabetes:              >  6.4%  Glycemic control for   <7.0% adults with diabetes    Mean Plasma Glucose 114.02 mg/dL    Comment: Performed at [facility], [address], [place], [place] [address]  CBC     Status: None   Collection Time: 12/16/22 10:30 PM  Result Value Ref Range   WBC 9.6 4.0 - 10.5 K/uL   RBC 4.36 3.87 - 5.11 MIL/uL   Hemoglobin 13.0 12.0 - 15.0 g/dL   HCT 56.2 13.0 - 86.5 %   MCV 90.4 80.0 - 100.0 fL   MCH 29.8 26.0 - 34.0 pg   MCHC 33.0 30.0 - 36.0 g/dL   RDW 78.4 69.6 - 29.5 %   Platelets 249 150 - 400 K/uL   nRBC 0.0 0.0 - 0.2 %    Comment: Performed at [facility], [address] [place], [place] [address]  Creatinine, serum     Status: Abnormal   Collection Time: 12/16/22 10:30 PM  Result Value Ref Range   Creatinine, Ser 1.34 (H) 0.44 - 1.00 mg/dL   GFR, Estimated 36 (L) >60 mL/min    Comment: (NOTE) Calculated using the CKD-EPI Creatinine Equation (2021) Performed at [facility], [address] [place], [place] [address]   Troponin I (High Sensitivity)     Status: None   Collection Time: 12/16/22 10:30 PM  Result Value Ref Range   Troponin I (High Sensitivity) 11 <18 ng/L    Comment: (NOTE) Elevated high sensitivity troponin I (hsTnI) values and significant  changes across serial measurements may suggest ACS but many other  chronic and acute conditions are known to elevate hsTnI results.  Refer to the "Links" section for chest pain algorithms and additional  guidance. Performed at [facility], [address] [place], [place] [address]   Lipid panel     Status: None   Collection Time: 12/17/22  6:14 AM  Result Value Ref Range   Cholesterol 133 0 - 200 mg/dL   Triglycerides 64 <366 mg/dL   HDL 56 >44 mg/dL   Total CHOL/HDL Ratio 2.4 RATIO   VLDL 13 0 - 40 mg/dL   LDL Cholesterol 64 0 - 99 mg/dL    Comment:        Total  Cholesterol/HDL:CHD Risk Coronary Heart Disease Risk Table                     Men   Women  1/2 Average Risk   3.4   3.3  Average Risk       5.0   4.4  2 X Average Risk   9.6   7.1  3 X Average Risk  23.4   11.0        Use the calculated Patient Ratio above and the CHD Risk Table to determine the patient's CHD Risk.        ATP III CLASSIFICATION (LDL):  <100     mg/dL   Optimal  034-742  mg/dL   Near or Above                    Optimal  130-159  mg/dL   Borderline  595-638  mg/dL   High  >756     mg/dL   Very High Performed at [facility], [address], [place], [place] [address]   Vitamin B12     Status: Abnormal   Collection Time: 12/17/22  6:14 AM  Result  Value Ref Range   Vitamin B-12 142 (L) 180 - 914 pg/mL    Comment: (NOTE) This assay is not validated for testing neonatal or myeloproliferative syndrome specimens for Vitamin B12 levels. Performed at [facility], [address] [place], [place] [address]   Sedimentation rate     Status: None   Collection Time: 12/17/22  6:14 AM  Result Value Ref Range   Sed Rate 8 0 - 22 mm/hr    Comment: Performed at [facility], [address], [place], [place] [address]  C-reactive protein     Status: None   Collection Time: 12/17/22  6:14 AM  Result Value Ref Range   CRP 0.5 <1.0 mg/dL    Comment: Performed at [facility], [address], [place], [place] [address]   VAS US CAROTID  Result Date: 12/17/2022 Carotid Arterial Duplex Study Patient Name:  [name]  Date of Exam:   12/17/2022 Medical Rec #: [id number]       Accession #:    [id number] Date of Birth: 03/01/1925       Patient Gender: F Patient Age:   64 years Exam Location:  [facility] Procedure:      VAS US CAROTID Referring Phys: [facility] GOEL --------------------------------------------------------------------------------  Indications:       Bruit, carotid R09.89. Risk Factors:       Hyperlipidemia. Comparison Study:  No prior studies. Performing Technologist: Chanda Busing RVT  Examination Guidelines: A complete evaluation includes B-mode imaging, spectral Doppler, color Doppler, and power Doppler as needed of all accessible portions of each vessel. Bilateral testing is considered an integral part of a complete examination. Limited examinations for reoccurring indications may be performed as noted.  Right Carotid Findings: +----------+--------+--------+--------+-----------------------+--------+           PSV cm/sEDV cm/sStenosisPlaque Description     Comments +----------+--------+--------+--------+-----------------------+--------+ CCA Prox  62      6               smooth and heterogenous         +----------+--------+--------+--------+-----------------------+--------+ CCA Distal61      11              smooth and heterogenous         +----------+--------+--------+--------+-----------------------+--------+ ICA Prox  47      11              smooth and heterogenous         +----------+--------+--------+--------+-----------------------+--------+ ICA Mid   56      9                                               +----------+--------+--------+--------+-----------------------+--------+ ICA Distal54      8                                      tortuous +----------+--------+--------+--------+-----------------------+--------+ ECA       89      0                                               +----------+--------+--------+--------+-----------------------+--------+ +----------+--------+-------+--------+-------------------+  PSV cm/sEDV cmsDescribeArm Pressure (mmHG) +----------+--------+-------+--------+-------------------+ [id number]                                         +----------+--------+-------+--------+-------------------+ +---------+--------+--+--------+-+---------+ VertebralPSV cm/s46EDV cm/s9Antegrade  +---------+--------+--+--------+-+---------+  Left Carotid Findings: +----------+--------+--------+--------+-----------------------+--------+           PSV cm/sEDV cm/sStenosisPlaque Description     Comments +----------+--------+--------+--------+-----------------------+--------+ CCA Prox  71      9               smooth and heterogenoustortuous +----------+--------+--------+--------+-----------------------+--------+ CCA Distal57      10              smooth and heterogenous         +----------+--------+--------+--------+-----------------------+--------+ ICA Prox  46      12              smooth and heterogenous         +----------+--------+--------+--------+-----------------------+--------+ ICA Mid   53      13              smooth and heterogenoustortuous +----------+--------+--------+--------+-----------------------+--------+ ICA Distal58      13                                     tortuous +----------+--------+--------+--------+-----------------------+--------+ ECA       52      1                                               +----------+--------+--------+--------+-----------------------+--------+ +----------+--------+--------+--------+-------------------+           PSV cm/sEDV cm/sDescribeArm Pressure (mmHG) +----------+--------+--------+--------+-------------------+ [id number]                                          +----------+--------+--------+--------+-------------------+ +---------+--------+--+--------+--+---------+ VertebralPSV cm/s43EDV cm/s10Antegrade +---------+--------+--+--------+--+---------+   Summary: Right Carotid: Velocities in the right ICA are consistent with a 1-39% stenosis. Left Carotid: Velocities in the left ICA are consistent with a 1-39% stenosis. Vertebrals: Bilateral vertebral arteries demonstrate antegrade flow. *See table(s) above for measurements and observations.     Preliminary    MR ANGIO HEAD WO  CONTRAST  Result Date: 12/17/2022 CLINICAL DATA:  89 year old female with TIA and small left corona radiata white matter infarcts on MRI yesterday. EXAM: MRA HEAD WITHOUT CONTRAST TECHNIQUE: Angiographic images of the Circle of Willis were acquired using MRA technique without intravenous contrast. COMPARISON:  Brain MRI 12/16/2022.  Intracranial MRA 12/20/2005. FINDINGS: Anterior circulation: Antegrade flow in the distal cervical ICAs and both ICA siphons. The left siphon appears dominant as in 2007, along with dominant left ACA A1 segment. No ICA siphon stenosis identified. Patent carotid termini, MCA and ACA origins. Chronically dominant left ACA A2 segment also. Visible ACA branches are within normal limits. Right MCA M1 segment, MCA trifurcation and visible right MCA branches are within normal limits. Left MCA M1 segment remains patent but with new mild to moderate mid M1 irregularity and stenosis since 2007 on series 1018, image 1. Patent left MCA bifurcation without stenosis. Visible left MCA branches appear stable and within normal limits. Posterior circulation: Antegrade flow in the  posterior circulation appears stable since 2007 with dominant left vertebral V4 segment. Left PICA origin is patent. Distal vertebral arteries, vertebrobasilar junction and basilar artery appear patent without stenosis. Patent SCA and PCA origins. Posterior communicating arteries are diminutive or absent. Right PCA branches are within normal limits. Mild to moderate new left P1/P2 segment junction irregularity and stenosis since 2007 (series 1030, image 5). Distal left PCA branches remain patent. Anatomic variants: Dominant distal left vertebral artery. Dominant left ICA siphon and left ACA. Other: No intracranial mass effect or ventriculomegaly. IMPRESSION: 1. Negative for large vessel occlusion. 2. Positive for mild to moderate intracranial atherosclerosis and stenosis affecting both the Left MCA M1 segment, also the Left PCA  P1/P2, new since a 2007 MRA. Electronically Signed   By: Odessa Fleming M.D.   On: 12/17/2022 07:21   DG Chest 2 View  Result Date: 12/16/2022 CLINICAL DATA:  Stroke. EXAM: CHEST - 2 VIEW COMPARISON:  Earlier today FINDINGS: Bibasilar atelectasis related to lower lung volumes. Stable heart size and mediastinal contours. No pulmonary edema, pleural effusion or pneumothorax. IMPRESSION: Bibasilar atelectasis. Electronically Signed   By: Narda Rutherford M.D.   On: 12/16/2022 21:55   MR BRAIN WO CONTRAST  Result Date: 12/16/2022 CLINICAL DATA:  Initial evaluation for acute TIA. EXAM: MRI HEAD WITHOUT CONTRAST TECHNIQUE: Multiplanar, multiecho pulse sequences of the brain and surrounding structures were obtained without intravenous contrast. COMPARISON:  Prior study from 10/05/2006. FINDINGS: Brain: Generalized age-related cerebral atrophy. Patchy and confluent T2/FLAIR hyperintensity involving the periventricular and deep white matter both cerebral hemispheres as well as the pons, consistent with chronic small vessel ischemic disease, moderately advanced in nature. Few scatter remote lacunar infarcts present about the deep gray nuclei. Small remote right cerebellar infarct noted. She Patchy small volume restricted diffusion involving the posterior left basal ganglia/corona radiata, consistent with acute ischemic infarcts (series 5, images 28-30). No associated hemorrhage or mass effect. No other evidence for acute or subacute ischemia. No acute intracranial hemorrhage. Single punctate chronic microhemorrhage noted at the left thalamus. No mass lesion, midline shift or mass effect. No hydrocephalus or extra-axial fluid collection. Pituitary gland and suprasellar region within normal limits. Vascular: Major intracranial vascular flow voids are maintained. Skull and upper cervical spine: Craniocervical junction with normal limits. Bone marrow signal intensity normal. No scalp soft tissue abnormality. Sinuses/Orbits:  Prior bilateral ocular lens replacement. Paranasal sinuses are clear. Moderate bilateral mastoid effusions noted, of doubtful significance. Visualized nasopharynx unremarkable. Other: None. IMPRESSION: 1. Patchy small volume acute ischemic nonhemorrhagic infarct involving the posterior left basal ganglia/corona radiata. 2. Underlying age-related cerebral atrophy with moderate chronic microvascular ischemic disease, with a few scattered remote lacunar infarcts about the deep gray nuclei and right cerebellum. Electronically Signed   By: Rise Mu M.D.   On: 12/16/2022 19:12   DG Chest 1 View  Result Date: 12/16/2022 CLINICAL DATA:  Fall EXAM: CHEST  1 VIEW COMPARISON:  10/12/2015 FINDINGS: Mild left basilar atelectasis. Lungs are otherwise clear. No pneumothorax or pleural effusion. Cardiac size within normal limits. Pulmonary vascularity is normal. No acute bone abnormality. IMPRESSION: No active disease. Electronically Signed   By: Helyn Numbers M.D.   On: 12/16/2022 17:14    Pending Labs Unresulted Labs (From admission, onward)     Start     Ordered   12/23/22 0500  Creatinine, serum  (enoxaparin (LOVENOX)    CrCl >/= 30 ml/min)  Weekly,   R     Comments: while on enoxaparin therapy  12/16/22 2103            Vitals/Pain Today's Vitals   12/17/22 0418 12/17/22 0749 12/17/22 1133 12/17/22 1440  BP:  (!) 146/68  137/76  Pulse:  72  80  Resp:  18  (!) 22  Temp: (!) 97 F (36.1 C) 98.2 F (36.8 C) 98.1 F (36.7 C) 98.6 F (37 C)  TempSrc: Oral Oral Oral Oral  SpO2:  96%  96%    Isolation Precautions No active isolations  Medications Medications  clopidogrel (PLAVIX) tablet 75 mg (75 mg Oral Given 12/16/22 2254)   stroke: early stages of recovery book ( Does not apply Not Given 12/17/22 0909)  acetaminophen (TYLENOL) tablet 650 mg (has no administration in time range)    Or  acetaminophen (TYLENOL) 160 MG/5ML solution 650 mg (has no administration in time range)     Or  acetaminophen (TYLENOL) suppository 650 mg (has no administration in time range)  senna-docusate (Senokot-S) tablet 1 tablet (has no administration in time range)  enoxaparin (LOVENOX) injection 30 mg (30 mg Subcutaneous Given 12/16/22 2259)  pravastatin (PRAVACHOL) tablet 40 mg (40 mg Oral Given 12/16/22 2254)  aspirin EC tablet 81 mg (81 mg Oral Given 12/17/22 0916)  aspirin tablet 325 mg (325 mg Oral Given 12/16/22 2254)    Mobility walks with device

## 2022-12-17 NOTE — Evaluation (Signed)
Physical Therapy Evaluation Patient Details Name: [name] MRN: [id number] DOB: Mar 06, 1925 Today's Date: 12/17/2022  History of Present Illness  [name] is a 89 y.o. female with medical history significant of symptoms of gait distrurbance and recent  fall. Patient  is from  ALF, ambulatory. MR brain:Left basal ganglia/corona radiata stroke, embolic in appearance  Clinical Impression  Pt admitted with above diagnosis.  Pt currently with functional limitations due to the deficits listed below (see PT Problem List). Pt will benefit from acute skilled PT to increase their independence and safety with mobility to allow discharge.     The patient presents with R LE decreased control and unsteady gait, dragging/shuffling gait using Rw.  More pronounced with increased distance.  Patient is HOH. Patient oriented to [facility] Per chart, ambulatory with Rw at baseline with recent falls, leading to ED visit.  Patient will benefit from continued inpatient follow up therapy, <3 hours/day       If plan is discharge home, recommend the following: A little help with walking and/or transfers;A little help with bathing/dressing/bathroom;Help with stairs or ramp for entrance;Assistance with cooking/housework;Assist for transportation   Can travel by private vehicle   Yes    Equipment Recommendations None recommended by PT  Recommendations for Other Services       Functional Status Assessment Patient has had a recent decline in their functional status and demonstrates the ability to make significant improvements in function in a reasonable and predictable amount of time.     Precautions / Restrictions Precautions Precautions: Fall      Mobility  Bed Mobility Overal bed mobility: Needs Assistance Bed Mobility: Supine to Sit, Sit to Supine     Supine to sit: Contact guard Sit to supine: Contact guard assist   General bed mobility comments: patient able to sit self upright and retrun  to  supine with  CGA    Transfers Overall transfer level: Needs assistance   Transfers: Sit to/from Stand Sit to Stand: Min assist           General transfer comment: posterior bias upon standing    Ambulation/Gait Ambulation/Gait assistance: Min assist, Mod assist Gait Distance (Feet): 80 Feet (then 60) Assistive device: Rolling walker (2 wheels) Gait Pattern/deviations: Step-to pattern, Decreased dorsiflexion - right, Decreased weight shift to right, Decreased stance time - right, Decreased step length - right, Shuffle Gait velocity: decr     General Gait Details: noted shuffle gait, knee became more flexed , trunk more flexed,on amb back from BR and required mod support  Stairs            Wheelchair Mobility     Tilt Bed    Modified Rankin (Stroke Patients Only)       Balance Overall balance assessment: History of Falls, Needs assistance Sitting-balance support: Feet supported, Bilateral upper extremity supported Sitting balance-Leahy Scale: Fair     Standing balance support: Bilateral upper extremity supported, No upper extremity supported Standing balance-Leahy Scale: Poor Standing balance comment: support to balance after toileting and with ambultion                             Pertinent Vitals/Pain Pain Assessment Pain Assessment: No/denies pain    Home Living Family/patient expects to be discharged to:: Assisted living                 Home Equipment: Rollator (4 wheels)      Prior  Function               Mobility Comments: per chart, ambulatory until recent gait disturbance, Unsure if requires assist with ADL's       Extremity/Trunk Assessment        Lower Extremity Assessment Lower Extremity Assessment: LLE deficits/detail;RLE deficits/detail RLE Deficits / Details: active  movement throughout, decreased  clearnace when stepping.  grossly 4/5 hip fflex, knee ext and dorsiflex, ?  sensation due to difficulty  FC/HOH LLE Deficits / Details: grossy WFL    Cervical / Trunk Assessment Cervical / Trunk Assessment: Kyphotic  Communication   Communication Communication: Difficulty communicating thoughts/reduced clarity of speech;Hearing impairment (slow to respond and answer, HA are not charged) Cueing Techniques: Verbal cues  Cognition Arousal: Alert Behavior During Therapy: WFL for tasks assessed/performed Overall Cognitive Status: No family/caregiver present to determine baseline cognitive functioning Area of Impairment: Orientation, Attention, Following commands, Awareness                 Orientation Level: Time, Situation Current Attention Level: Sustained   Following Commands: Follows one step commands with increased time   Awareness: Emergent   General Comments: oriented to WL but not reason, date        General Comments      Exercises     Assessment/Plan    PT Assessment Patient needs continued PT services  PT Problem List Decreased strength;Decreased balance;Decreased cognition;Decreased knowledge of precautions;Decreased mobility;Decreased safety awareness;Decreased activity tolerance       PT Treatment Interventions DME instruction;Therapeutic activities;Cognitive remediation;Gait training;Therapeutic exercise;Functional mobility training;Balance training;Neuromuscular re-education    PT Goals (Current goals can be found in the Care Plan section)  Acute Rehab PT Goals Patient Stated Goal: none stated PT Goal Formulation: Patient unable to participate in goal setting Time For Goal Achievement: 12/31/22 Potential to Achieve Goals: Good    Frequency Min 1X/week     Co-evaluation PT/OT/SLP Co-Evaluation/Treatment: Yes Reason for Co-Treatment: To address functional/ADL transfers PT goals addressed during session: Mobility/safety with mobility OT goals addressed during session: ADL's and self-care       AM-PAC PT "6 Clicks" Mobility  Outcome Measure Help  needed turning from your back to your side while in a flat bed without using bedrails?: A Little Help needed moving from lying on your back to sitting on the side of a flat bed without using bedrails?: A Little Help needed moving to and from a bed to a chair (including a wheelchair)?: A Little Help needed standing up from a chair using your arms (e.g., wheelchair or bedside chair)?: A Lot Help needed to walk in hospital room?: A Lot Help needed climbing 3-5 steps with a railing? : Total 6 Click Score: 14    End of Session Equipment Utilized During Treatment: Gait belt Activity Tolerance: Patient tolerated treatment well Patient left: in bed;with call bell/phone within reach Nurse Communication: Mobility status PT Visit Diagnosis: Unsteadiness on feet (R26.81);Muscle weakness (generalized) (M62.81);Difficulty in walking, not elsewhere classified (R26.2);Other symptoms and signs involving the nervous system (R29.898)    Time: 1610-9604 PT Time Calculation (min) (ACUTE ONLY): 19 min   Charges:   PT Evaluation $PT Eval Low Complexity: 1 Low   PT General Charges $$ ACUTE PT VISIT: 1 Visit         Blanchard Kelch PT Acute Rehabilitation Services Office [phone] Weekend pager-[phone]   Rada Hay 12/17/2022, 9:54 AM

## 2022-12-17 NOTE — Progress Notes (Signed)
Carotid artery duplex has been completed. Preliminary results can be found in CV Proc through chart review.   12/17/22 10:48 AM Olen Cordial RVT

## 2022-12-17 NOTE — Consult Note (Signed)
Neurology Consultation Reason for Consult: Stroke on MRI Requesting Physician: Alvester Chou  CC: Gait impairment  History is obtained from: chart review  HPI: [name] is a 89 y.o. right-handed woman with a past medical history significant for memory loss, hypertension, hyperlipidemia, hearing loss, BMI 18.17 (underweight)  She lives in "[facility]" and nursing home.  On Saturday she was noted to have increased weakness and unsteady gait.  She has had a significant cognitive decline since 2020 (MMSE 29 in 2020 --> 22 on 10/30/2022).  No family is available at the time of my evaluation and patient some baseline memory impairment makes her her story unreliable  Per ED provider discussion with son, no issues with GI bleeding or other contraindications to aspirin or Plavix that the son is aware of  LKW: 2 to 3 days prior to admission Thrombolytic given?: No, out of the window IA performed?: No, exam not consistent with LVO Premorbid modified rankin scale:      3 - Moderate disability. Requires some help, but able to walk unassisted.    ROS: Unable to obtain due to baseline dementia  Past Medical History:  Diagnosis Date   Acute upper respiratory infections of unspecified site 2013   Cellulitis and abscess of hand, except fingers and thumb 10/04/2010   Degeneration of intervertebral disc, site unspecified 2000   Diaphragmatic hernia without mention of obstruction or gangrene 08/15/1998   Diverticulosis of colon (without mention of hemorrhage) 2000   Hearing loss 09/28/2015   Hyperlipidemia 2007   Hypertension 2007   Impacted cerumen    Loss of weight 04/25/2011   Myalgia and myositis, unspecified 01/07/2007   Nontoxic uninodular goiter 08/15/1998   Osteoarthrosis, unspecified whether generalized or localized, unspecified site 07/22/2000   Osteoporosis 2000   Other drug allergy(995.27) 04/04/2011   Pain in limb 2012   Personal history of fall 2012   Pulmonary  hypertension (HCC) 07/25/14   Mild-moderate   Spinal stenosis, unspecified region other than cervical 2002   Sprain of ankle, left    Supraventricular premature beats 10/2006   Tricuspid regurgitation 07/25/14   Mild-moderate   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 Lynnette Caffey   CATARACT EXTRACTION W/ INTRAOCULAR LENS IMPLANT Right 05/19/2014   Dr. Dione Booze   COLONOSCOPY  1992   acute segmental colitis Dr. Russella Dar   FACIAL COSMETIC SURGERY  1995   Dr. Charolotte Eke   IR KYPHO LUMBAR INC FX REDUCE BONE BX UNI/BIL CANNULATION INC/IMAGING  04/18/2020   LARYNGOSCOPY  1987   and biopsy  Dr. Haroldine Laws   Current Outpatient Medications  Medication Instructions   cholecalciferol (VITAMIN D) 1,000 Units, Daily   docusate sodium (COLACE) 100 mg, 2 times daily PRN   HYDROcodone-acetaminophen (NORCO/VICODIN) 5-325 MG per tablet 1 tablet, 3 times daily PRN   Magnesium 250 mg, Daily   metoprolol succinate (TOPROL-XL) 25 MG 24 hr tablet TAKE 1 TABLET ONCE DAILY.   rosuvastatin (CRESTOR) 5 MG tablet TAKE ONE TABLET BY MOUTH DAILY TO LOWER CHOLESTEROL     Family History  Problem Relation Age of Onset   Stroke Mother    Stroke Father    Hypertension Brother    Rheum arthritis Maternal Grandmother    Pancreatic cancer  Sister     Social History:  reports that she has never smoked. She has never used smokeless tobacco. She reports that she does not drink alcohol and does not use drugs.   Exam: Current vital signs: BP (!) 143/73 (BP Location: Left Arm)   Pulse 70   Temp (!) 97 F (36.1 C) (Oral)   Resp 18   SpO2 95%  Vital signs in last 24 hours: Temp:  [97 F (36.1 C)-98.6 F (37 C)] 97 F (36.1 C) (10/08 0418) Pulse Rate:  [67-85] 70  (10/08 0417) Resp:  [16-27] 18 (10/08 0417) BP: (143-174)/(61-91) 143/73 (10/08 0417) SpO2:  [95 %-99 %] 95 % (10/08 0417) Weight:  [49.5 kg] 49.5 kg (10/07 1320)   Physical Exam  Constitutional: Appears thin but well-groomed Psych: Affect pleasant and cooperative (per nursing was tearful and distressed earlier) Eyes: No scleral injection HENT: No oropharyngeal obstruction.  MSK: no major joint deformities.  Cardiovascular: Normal rate and regular rhythm. Perfusing extremities well Respiratory: Effort normal, non-labored breathing GI: Soft.  No distension. There is no tenderness.  Skin: Warm dry and intact visible skin  Neuro: Mental Status: Patient is awake, alert, oriented to person, place [facility]), but not month, year, or situation. Very mild aphasia (difficulty naming band of the watch, difficulty repeating complex but not simple sentences) Cranial Nerves: II: Visual Fields are full. Pupils are equal, round, and reactive to light.   III,IV, VI: EOMI. However pursuits are very saccadic, and there is notable left eye ptosis V: Facial sensation is symmetric to light touch VII: Facial movement is symmetric.  VIII: hearing is hard of hearing at baseline but intact to voice X: Uvula elevates symmetrically XI: Shoulder shrug is symmetric. XII: tongue is midline without atrophy or fasciculations.  Motor: Tone is normal. Bulk is normal. 5/5 strength was present in all four extremities, except for mild to 4+ knee flexion strength on the left, and slower finger tapping in the left hand, mild bilateral hip flexion weakness Sensory: Sensation is symmetric to light touch and temperature in the arms and legs. Deep Tendon Reflexes: 2+ and symmetric in the brachioradialis and patellae.  Cerebellar: FNF and HKS are intact bilaterally Gait:  Deferred   NIHSS total 3 Score breakdown: 2 points for incorrectly stating age and month, 1 point for mild aphasia   I have reviewed  labs in epic and the results pertinent to this consultation are:  Basic Metabolic Panel: Recent Labs  Lab 12/16/22 1447 12/16/22 2230  NA 134*  --   K 4.5  --   CL 101  --   CO2 25  --   GLUCOSE 101*  --   BUN 24*  --   CREATININE 1.22* 1.34*  CALCIUM 8.8*  --     CBC: Recent Labs  Lab 12/16/22 1447 12/16/22 2230  WBC 8.2 9.6  NEUTROABS 4.9  --   HGB 12.5 13.0  HCT 38.4 39.4  MCV 91.9 90.4  PLT 261 249    Coagulation Studies: No results for input(s): "LABPROT", "INR" in the last 72 hours.   Lab Results  Component Value Date   HGBA1C 5.6 12/16/2022    Lab Results  Component Value Date   CHOL 154 12/06/2021   HDL 51 12/06/2021   LDLCALC 86 12/06/2021   TRIG 77 12/06/2021   CHOLHDL 3.0 12/06/2021    I have reviewed the images obtained:  MRI brain  1. Patchy small volume acute ischemic nonhemorrhagic infarct involving the  posterior left basal ganglia/corona radiata. 2. Underlying age-related cerebral atrophy with moderate chronic microvascular ischemic disease, with a few scattered remote lacunar infarcts about the deep gray nuclei and right cerebellum.  Impression: Likely atheroembolic versus cardioembolic stroke in the left basal ganglia and corona radiata.  Given patient's history of unspecified myalgia/myositis and reported weight loss and muscle pains on history, I do think obtaining ESR and CRP as part of the stroke workup is reasonable noting that she will not be able to reliably report signs and symptoms of GCA/PMR  Recommendations:  # Left basal ganglia/corona radiata stroke, embolic in appearance - Stroke labs ESR, CRP, HgbA1c, fasting lipid panel - MRA of the brain without contrast and Carotid duplex  - Frequent neuro checks - Echocardiogram - Prophylactic therapy-Antiplatelet med: Aspirin - dose 325mg  PO or 300mg  PR, followed by 81 mg daily - Plavix 75 mg daily for 21 - 90 day course (to be determined pending vessel imaging) - Risk factor  modification - Telemetry monitoring - Blood pressure goal --out of the permissive hypertension window, goal normotension - PT consult, OT consult, Speech consult, unless patient is back to baseline - Stroke team to follow workup above.  If patient remains clinically workup may be followed remotely   Brooke Dare MD-PhD [facility] [phone]

## 2022-12-17 NOTE — ED Notes (Signed)
Patient placed on tele box.

## 2022-12-17 NOTE — Progress Notes (Signed)
  Progress Note   Patient: [name] DDU:[id number] DOB: 1924-10-16 DOA: 12/16/2022     1 DOS: the patient was seen and examined on 12/17/2022   Brief hospital course: 89 y.o. female with medical history significant of symptoms of assisted living facility with fairly good functional status, almost independent.  Patient was noted by staff at the living facility for the last 2 or 3 days to have a gait disturbance/unsteadiness.  There has been no fall in the last 2 or 3 days, although patient did have a fall about a week ago with some injury to the right elbow skin area.  Patient has had no fever nausea vomiting diarrhea or headache.  Patient was sent to the ER for further evaluation of her gait disturbance   Workup in the ER has revealed an acute infarction. Medical evaluation is sought   Assessment and Plan: Stroke (cerebrum) (HCC) -MRI with finding of patchy small vol acute ischemic nonhemorrhagic infarct involving post L basal ganglia/corona radiata -Neurology/stroke team consulted.  -2d echo reviewed. Normal LVEF with no atrial level shunt detected -B carotids patent -Per Stroke MD, recs thus far for DAPT x 90 days then ASA 81mg  daily  -Also rec for 30 day heart monitor -Therapy recs for SNF noted  HLD -LDL 64 -cont pravastatin  HTN -bp stable -Home bp med on hold to allow permissive htn  Pulm HTN -seems stable at this time    Subjective: Without complaints this AM  Physical Exam: Vitals:   12/17/22 0749 12/17/22 1133 12/17/22 1440 12/17/22 1635  BP: (!) 146/68  137/76 (!) 144/77  Pulse: 72  80 78  Resp: 18  (!) 22 19  Temp: 98.2 F (36.8 C) 98.1 F (36.7 C) 98.6 F (37 C) 98.2 F (36.8 C)  TempSrc: Oral Oral Oral Oral  SpO2: 96%  96% 97%   General exam: Awake, laying in bed, in nad Respiratory system: Normal respiratory effort, no wheezing Cardiovascular system: regular rate, s1, s2 Gastrointestinal system: Soft, nondistended, positive BS Central nervous  system: CN2-12 grossly intact, strength intact Extremities: Perfused, no clubbing Skin: Normal skin turgor, no notable skin lesions seen Psychiatry: Mood normal // no visual hallucinations   Data Reviewed:  Labs reviewed: Chol 133, LDL 64, TG 64   Family Communication: Pt in room, family not at bedside  Disposition: Status is: Inpatient Remains inpatient appropriate because: severity of illness  Planned Discharge Destination: Skilled nursing facility    Author: Rickey Barbara, MD 12/17/2022 5:43 PM  For on call review [email or web address].

## 2022-12-17 NOTE — Progress Notes (Signed)
  Echocardiogram 2D Echocardiogram has been performed.  [name] 12/17/2022, 2:14 PM

## 2022-12-17 NOTE — Plan of Care (Addendum)
Carotid u/s neg.   Echo neg. No PFO.    MRA: atherosclerotic disease, no LVO.   DAPT therapy for 90 days then aspirin 81mg  daily. 30 day heart monitor.  PT/OT/Speech.  F/u in stroke clinic in 8 weeks.   See Dr. Rollene Fare initial consult note for details.

## 2022-12-18 DIAGNOSIS — I639 Cerebral infarction, unspecified: Secondary | ICD-10-CM | POA: Diagnosis not present

## 2022-12-18 LAB — COMPREHENSIVE METABOLIC PANEL
ALT: 12 U/L (ref 0–44)
AST: 19 U/L (ref 15–41)
Albumin: 3.2 g/dL — ABNORMAL LOW (ref 3.5–5.0)
Alkaline Phosphatase: 53 U/L (ref 38–126)
Anion gap: 9 (ref 5–15)
BUN: 29 mg/dL — ABNORMAL HIGH (ref 8–23)
CO2: 25 mmol/L (ref 22–32)
Calcium: 9.1 mg/dL (ref 8.9–10.3)
Chloride: 102 mmol/L (ref 98–111)
Creatinine, Ser: 1.11 mg/dL — ABNORMAL HIGH (ref 0.44–1.00)
GFR, Estimated: 45 mL/min — ABNORMAL LOW (ref >60)
Glucose, Bld: 101 mg/dL — ABNORMAL HIGH (ref 70–99)
Potassium: 3.6 mmol/L (ref 3.5–5.1)
Sodium: 136 mmol/L (ref 135–145)
Total Bilirubin: 1.1 mg/dL (ref 0.3–1.2)
Total Protein: 5.6 g/dL — ABNORMAL LOW (ref 6.5–8.1)

## 2022-12-18 LAB — CBC
HCT: 39.3 % (ref 36.0–46.0)
Hemoglobin: 13.4 g/dL (ref 12.0–15.0)
MCH: 30.2 pg (ref 26.0–34.0)
MCHC: 34.1 g/dL (ref 30.0–36.0)
MCV: 88.5 fL (ref 80.0–100.0)
Platelets: 256 10*3/uL (ref 150–400)
RBC: 4.44 MIL/uL (ref 3.87–5.11)
RDW: 14 % (ref 11.5–15.5)
WBC: 8.8 10*3/uL (ref 4.0–10.5)
nRBC: 0 % (ref 0.0–0.2)

## 2022-12-18 NOTE — NC FL2 (Addendum)
[place] MEDICAID FL2 LEVEL OF CARE FORM     IDENTIFICATION  Patient Name: [name] Birthdate: [birth year] Sex: female Admission Date (Current Location): 12/16/2022  [facility] and [place] Number:  Producer, television/film/video and Address:  [facility] [place], [address], [place], [place] [address]      Provider Number: [id number]  Attending Physician Name and Address:  Willeen Niece, MD  Relative Name and Phone Number:       Current Level of Care: SNF Recommended Level of Care: Skilled Nursing Facility Prior Approval Number:    Date Approved/Denied:   PASRR Number: [id number]  Discharge Plan: SNF    Current Diagnoses: Patient Active Problem List   Diagnosis Date Noted   Stroke [facility]) 12/17/2022   Stroke (cerebrum) (HCC) 12/16/2022   Cognitive change 10/29/2022   Feeling lonely 01/17/2022   CKD (chronic kidney disease) stage 3, GFR 30-59 ml/min (HCC) 06/01/2020   Impaired memory 05/21/2018   Expiratory wheezing 11/18/2017   UTI (urinary tract infection) 03/28/2017   Dysuria 03/27/2017   Persistent cough 03/20/2017   Acute bronchitis 03/13/2017   Iron deficiency anemia 11/26/2016   History of cardiac arrhythmia 11/26/2016   History of pleural effusion 11/26/2016   Chronic constipation 11/26/2016   History of cardioembolic cerebrovascular accident (CVA) 11/26/2016   History of cataract 11/26/2016   Hearing loss 09/28/2015   Pain in the chest    Near syncope 04/30/2015   Palpitations 04/30/2015   GERD (gastroesophageal reflux disease) 12/29/2014   Adverse drug effect 10/29/2014   Upper airway cough syndrome 09/17/2014   Dyspnea 08/31/2014   Hyperglycemia 07/25/2014   Chronic back pain 07/21/2014   Urine frequency 01/20/2014   Pain in hip 07/08/2013   History of fall    Hypertension    Chronic lower back pain    Hyperlipidemia LDL goal <70    Loss of weight 04/25/2011   Supraventricular premature beats 10/10/2006   Nontoxic uninodular  goiter 08/15/1998    Orientation RESPIRATION BLADDER Height & Weight     Self  Normal Incontinent Weight:   Height:     BEHAVIORAL SYMPTOMS/MOOD NEUROLOGICAL BOWEL NUTRITION STATUS      Incontinent Diet (Room Service, Fluid)  AMBULATORY STATUS COMMUNICATION OF NEEDS Skin   Extensive Assist Verbally Skin abrasions (Right Arm Gauze Dressing)                       Personal Care Assistance Level of Assistance  Bathing, Feeding, Dressing Bathing Assistance: Limited assistance Feeding assistance: Maximum assistance Dressing Assistance: Limited assistance     Functional Limitations Info  Hearing, Sight Sight Info: Impaired Hearing Info: Impaired      SPECIAL CARE FACTORS FREQUENCY  PT (By licensed PT), OT (By licensed OT)     PT Frequency: 5x/week OT Frequency: 5x/week            Contractures      Additional Factors Info  Code Status, Allergies Code Status Info: Full Allergies Info: Doxycycline, Lipitor (Atorvastatin), Pantoprazole, Pepcid (Famotidine)           Current Medications (12/18/2022):  This is the current hospital active medication list Current Facility-Administered Medications  Medication Dose Route Frequency Provider Last Rate Last Admin   acetaminophen (TYLENOL) tablet 650 mg  650 mg Oral Q4H PRN Nolberto Hanlon, MD       Or   acetaminophen (TYLENOL) 160 MG/5ML solution 650 mg  650 mg Per Tube Q4H PRN Nolberto Hanlon,  MD       Or   acetaminophen (TYLENOL) suppository 650 mg  650 mg Rectal Q4H PRN Nolberto Hanlon, MD       aspirin EC tablet 81 mg  81 mg Oral Daily Bhagat, Srishti L, MD   81 mg at 12/18/22 1040   clopidogrel (PLAVIX) tablet 75 mg  75 mg Oral Daily Nolberto Hanlon, MD   75 mg at 12/17/22 2155   enoxaparin (LOVENOX) injection 30 mg  30 mg Subcutaneous Q24H Nolberto Hanlon, MD   30 mg at 12/17/22 2155   pravastatin (PRAVACHOL) tablet 40 mg  40 mg Oral q1800 Nolberto Hanlon, MD   40 mg at 12/17/22 1821   senna-docusate (Senokot-S) tablet 1 tablet  1 tablet  Oral QHS PRN Nolberto Hanlon, MD         Discharge Medications: Please see discharge summary for a list of discharge medications.  Relevant Imaging Results:  Relevant Lab Results:   Additional Information SSN:[id number]  Antion Felipa Emory, Student-Social Work

## 2022-12-18 NOTE — Progress Notes (Signed)
SLP Cancellation Note  Patient Details Name: [name] MRN: [id number] DOB: January 11, 1925   Cancelled treatment:       Reason Eval/Treat Not Completed: Other (comment) (Hearing aids ineffective (no charger available to charge); family wishes to have speech/language cognitive assessment completed once she returns to [facility] SNF)   Dennie Bible Rik Wadel,M.S.,CCC-SLP 12/18/2022, 12:26 PM

## 2022-12-18 NOTE — TOC Initial Note (Addendum)
Transition of Care [facility]) - Initial/Assessment Note    Patient Details  Name: [name] MRN: [id number] Date of Birth: 23-Jul-1924  Transition of Care [facility]) CM/SW Contact:    Baldemar Lenis, LCSW Phone Number: 12/18/2022, 10:46 AM  Clinical Narrative:         CSW spoke with patient's son, Rocky Link, about recommendation for SNF placement. Rocky Link in agreement, preference to return to [employer]. CSW answered questions and discussed trying to obtain the chargers for patient's hearing aids to assist in medical care. CSW attempted to reach [facility], left a voicemail for admissions. CSW to follow.     UPDATE: CSW received call back from [facility] that they have a bed available for patient at SNF. CSW requested CMA to initiate insurance authorization. CSW to follow.       Expected Discharge Plan: Skilled Nursing Facility Barriers to Discharge: Insurance Authorization, Continued Medical Work up   Patient Goals and CMS Choice Patient states their goals for this hospitalization and ongoing recovery are:: patient unable to participate in goal setting, not oriented CMS Medicare.gov Compare Post Acute Care list provided to:: Patient Represenative (must comment) Choice offered to / list presented to : Adult Children [place] ownership interest in [facility].provided to:: Adult Children    Expected Discharge Plan and Services     Post Acute Care Choice: Skilled Nursing Facility Living arrangements for the past 2 months: Assisted Living Facility                                      Prior Living Arrangements/Services Living arrangements for the past 2 months: Assisted Living Facility Lives with:: Facility Resident Patient language and need for interpreter reviewed:: No Do you feel safe going back to the place where you live?: Yes      Need for Family Participation in Patient Care: Yes (Comment) Care giver support system in place?: Yes  (comment) Current home services: DME Criminal Activity/Legal Involvement Pertinent to Current Situation/Hospitalization: No - Comment as needed  Activities of Daily Living   ADL Screening (condition at time of admission) Independently performs ADLs?: No Does the patient have a NEW difficulty with bathing/dressing/toileting/self-feeding that is expected to last >3 days?: No Does the patient have a NEW difficulty with getting in/out of bed, walking, or climbing stairs that is expected to last >3 days?: No Does the patient have a NEW difficulty with communication that is expected to last >3 days?: No Is the patient deaf or have difficulty hearing?: Yes Does the patient have difficulty seeing, even when wearing glasses/contacts?: No Does the patient have difficulty concentrating, remembering, or making decisions?: Yes  Permission Sought/Granted Permission sought to share information with : Facility Medical sales representative, Family Supports Permission granted to share information with : Yes, Verbal Permission Granted  Share Information with NAME: Rocky Link  Permission granted to share info w AGENCY: [facility]  Permission granted to share info w Relationship: Son     Emotional Assessment   Attitude/Demeanor/Rapport: Unable to Assess Affect (typically observed): Unable to Assess Orientation: : Oriented to Self, Oriented to Place Alcohol / Substance Use: Not Applicable Psych Involvement: No (comment)  Admission diagnosis:  Abnormal gait [R26.9] Stroke (HCC) [I63.9] Stroke (cerebrum) (HCC) [I63.9] Generalized weakness [R53.1] Cerebrovascular accident (CVA), unspecified mechanism (HCC) [I63.9] Patient Active Problem List   Diagnosis Date Noted   Stroke (HCC) 12/17/2022  Stroke (cerebrum) (HCC) 12/16/2022   Cognitive change 10/29/2022   Feeling lonely 01/17/2022   CKD (chronic kidney disease) stage 3, GFR 30-59 ml/min (HCC) 06/01/2020   Impaired memory 05/21/2018   Expiratory  wheezing 11/18/2017   UTI (urinary tract infection) 03/28/2017   Dysuria 03/27/2017   Persistent cough 03/20/2017   Acute bronchitis 03/13/2017   Iron deficiency anemia 11/26/2016   History of cardiac arrhythmia 11/26/2016   History of pleural effusion 11/26/2016   Chronic constipation 11/26/2016   History of cardioembolic cerebrovascular accident (CVA) 11/26/2016   History of cataract 11/26/2016   Hearing loss 09/28/2015   Pain in the chest    Near syncope 04/30/2015   Palpitations 04/30/2015   GERD (gastroesophageal reflux disease) 12/29/2014   Adverse drug effect 10/29/2014   Upper airway cough syndrome 09/17/2014   Dyspnea 08/31/2014   Hyperglycemia 07/25/2014   Chronic back pain 07/21/2014   Urine frequency 01/20/2014   Pain in hip 07/08/2013   History of fall    Hypertension    Chronic lower back pain    Hyperlipidemia LDL goal <70    Loss of weight 04/25/2011   Supraventricular premature beats 10/10/2006   Nontoxic uninodular goiter 08/15/1998   PCP:  Venita Sheffield, MD Pharmacy:   [facility] [place], [place] - [address] [facility] [address] [place] [address] Phone: [phone] Fax: [phone]  [id number] - [place], [place] - [address] [place] [address] Phone: [phone] Fax: [phone]     Social Determinants of Health (SDOH) Social History: SDOH Screenings   Food Insecurity: No Food Insecurity (12/17/2022)  Housing: Low Risk  (12/17/2022)  Transportation Needs: No Transportation Needs (12/17/2022)  Utilities: Not At Risk (12/17/2022)  Depression (PHQ2-9): Low Risk  (10/30/2022)  Tobacco Use: Low Risk  (12/16/2022)   SDOH Interventions:     Readmission Risk Interventions     No data to display

## 2022-12-18 NOTE — Progress Notes (Signed)
PROGRESS NOTE    [name]  JOA:[id number] DOB: [birth year] DOA: 12/16/2022 PCP: Venita Sheffield, MD   Brief Narrative:  This 89 y.o. female with fairly good functional status, almost independent.  Patient was noted by staff at the living facility for the last 2 or 3 days to have a gait disturbance/unsteadiness.  There has been no fall in the last 2 or 3 days, although patient did have a fall about a week ago with some injury to the right elbow skin area.  Patient has had no fever,  nausea, vomiting ,diarrhea or headache.  Patient was sent to the ER for further evaluation of her gait disturbance. Workup in the ER has revealed an acute  CVA. Medical evaluation is sought.   Assessment & Plan:   Principal Problem:   Stroke [facility]) Active Problems:   Stroke (cerebrum) (HCC)  Stroke (cerebrum) (HCC) -MRI showed patchy small volume acute ischemic nonhemorrhagic infarct involving post L basal ganglia/corona radiata. -Neurology/stroke team consulted.  -2d echo reviewed. Normal LVEF with no atrial level shunt detected. -B carotids patent. -Per Stroke MD, recs thus far for DAPT x 90 days then ASA 81mg  daily  -Also recommended for 30 day heart monitor -Therapy recs for SNF noted.   Hyper Lipidemia: -LDL 64 -cont pravastatin   HTN: -bp stable -Home bp med on hold to allow permissive htn.   Pulm HTN -seems stable at this time   DVT prophylaxis: Lovenox Code Status: Full code Family Communication:No family at bed side. Disposition Plan:     Status is: Inpatient Remains inpatient appropriate because: Admitted for left basal ganglia stroke.  Neurology consulted started on DAPT.  Therapy recommended SNF.  Awaiting insurance authorization  Consultants:  Neurology  Procedures: MRI Brain.  Antimicrobials:  Anti-infectives (From admission, onward)    None        Subjective: Patient seen and examined at bedside.  Overnight events noted.  She appears very deconditioned,  elderly but lying comfortably in bed,  denies any pain.  Objective: Vitals:   12/17/22 2334 12/18/22 0355 12/18/22 0802 12/18/22 1146  BP: (!) 160/100 (!) 146/83 128/75 (!) 150/84  Pulse: 86 86 89 85  Resp: 17 18 18 18   Temp: 97.8 F (36.6 C) (!) 97.4 F (36.3 C) 97.6 F (36.4 C) (!) 97.4 F (36.3 C)  TempSrc: Oral Oral Oral Oral  SpO2: 98% 100% 98% 97%    Intake/Output Summary (Last 24 hours) at 12/18/2022 1454 Last data filed at 12/18/2022 1250 Gross per 24 hour  Intake 238 ml  Output 200 ml  Net 38 ml   There were no vitals filed for this visit.  Examination:  General exam: Appears calm and comfortable , deconditioned, elderly not in any distress Respiratory system: Clear to auscultation. Respiratory effort normal. RR 14 Cardiovascular system: S1 & S2 heard, RRR. No JVD, murmurs, rubs, gallops or clicks. No pedal edema. Gastrointestinal system: Abdomen is non distended, soft and non tender.Normal bowel sounds heard. Central nervous system: Alert and oriented X 2. No focal neurological deficits. Extremities: No edema, No cyanosis, no clubbing. Skin: No rashes, lesions or ulcers Psychiatry: Judgement and insight appear normal. Mood & affect appropriate.     Data Reviewed: I have personally reviewed following labs and imaging studies  CBC: Recent Labs  Lab 12/16/22 1447 12/16/22 2230 12/18/22 0506  WBC 8.2 9.6 8.8  NEUTROABS 4.9  --   --   HGB 12.5 13.0 13.4  HCT 38.4 39.4 39.3  MCV 91.9 90.4  88.5  PLT 261 249 256   Basic Metabolic Panel: Recent Labs  Lab 12/16/22 1447 12/16/22 2230 12/18/22 0506  NA 134*  --  136  K 4.5  --  3.6  CL 101  --  102  CO2 25  --  25  GLUCOSE 101*  --  101*  BUN 24*  --  29*  CREATININE 1.22* 1.34* 1.11*  CALCIUM 8.8*  --  9.1   GFR: Estimated Creatinine Clearance: 23.2 mL/min (A) (by C-G formula based on SCr of 1.11 mg/dL (H)). Liver Function Tests: Recent Labs  Lab 12/16/22 1447 12/18/22 0506  AST 17 19  ALT 11  12  ALKPHOS 51 53  BILITOT 0.6 1.1  PROT 6.2* 5.6*  ALBUMIN 3.5 3.2*   No results for input(s): "LIPASE", "AMYLASE" in the last 168 hours. No results for input(s): "AMMONIA" in the last 168 hours. Coagulation Profile: No results for input(s): "INR", "PROTIME" in the last 168 hours. Cardiac Enzymes: No results for input(s): "CKTOTAL", "CKMB", "CKMBINDEX", "TROPONINI" in the last 168 hours. BNP (last 3 results) No results for input(s): "PROBNP" in the last 8760 hours. HbA1C: Recent Labs    12/16/22 2230  HGBA1C 5.6   CBG: No results for input(s): "GLUCAP" in the last 168 hours. Lipid Profile: Recent Labs    12/17/22 0614  CHOL 133  HDL 56  LDLCALC 64  TRIG 64  CHOLHDL 2.4   Thyroid Function Tests: No results for input(s): "TSH", "T4TOTAL", "FREET4", "T3FREE", "THYROIDAB" in the last 72 hours. Anemia Panel: Recent Labs    12/17/22 0614  VITAMINB12 142*   Sepsis Labs: No results for input(s): "PROCALCITON", "LATICACIDVEN" in the last 168 hours.  Recent Results (from the past 240 hour(s))  MRSA Next Gen by PCR, Nasal     Status: None   Collection Time: 12/17/22  5:51 PM   Specimen: Nasal Mucosa; Nasal Swab  Result Value Ref Range Status   MRSA by PCR Next Gen NOT DETECTED NOT DETECTED Final    Comment: (NOTE) The GeneXpert MRSA Assay (FDA approved for NASAL specimens only), is one component of a comprehensive MRSA colonization surveillance program. It is not intended to diagnose MRSA infection nor to guide or monitor treatment for MRSA infections. Test performance is not FDA approved in patients less than 61 years old. Performed at [facility], [address] [place], [place] [address]     Radiology Studies: VAS US CAROTID  Result Date: 12/17/2022 Carotid Arterial Duplex Study Patient Name:  [name]  Date of Exam:   12/17/2022 Medical Rec #: [id number]       Accession #:    [id number] Date of Birth: 12-10-1924       Patient Gender: F Patient Age:    27 years Exam Location:  [facility] Procedure:      VAS US CAROTID Referring Phys: [facility] GOEL --------------------------------------------------------------------------------  Indications:       Bruit, carotid R09.89. Risk Factors:      Hyperlipidemia. Comparison Study:  No prior studies. Performing Technologist: Chanda Busing RVT  Examination Guidelines: A complete evaluation includes B-mode imaging, spectral Doppler, color Doppler, and power Doppler as needed of all accessible portions of each vessel. Bilateral testing is considered an integral part of a complete examination. Limited examinations for reoccurring indications may be performed as noted.  Right Carotid Findings: +----------+--------+--------+--------+-----------------------+--------+           PSV cm/sEDV cm/sStenosisPlaque Description     Comments +----------+--------+--------+--------+-----------------------+--------+ CCA Prox  62      6               smooth and heterogenous         +----------+--------+--------+--------+-----------------------+--------+ CCA Distal61      11              smooth and heterogenous         +----------+--------+--------+--------+-----------------------+--------+ ICA Prox  47      11              smooth and heterogenous         +----------+--------+--------+--------+-----------------------+--------+ ICA Mid   56      9                                               +----------+--------+--------+--------+-----------------------+--------+ ICA Distal54      8                                      tortuous +----------+--------+--------+--------+-----------------------+--------+ ECA       89      0                                               +----------+--------+--------+--------+-----------------------+--------+ +----------+--------+-------+--------+-------------------+           PSV cm/sEDV cmsDescribeArm Pressure (mmHG)  +----------+--------+-------+--------+-------------------+ [id number]                                         +----------+--------+-------+--------+-------------------+ +---------+--------+--+--------+-+---------+ VertebralPSV cm/s46EDV cm/s9Antegrade +---------+--------+--+--------+-+---------+  Left Carotid Findings: +----------+--------+--------+--------+-----------------------+--------+           PSV cm/sEDV cm/sStenosisPlaque Description     Comments +----------+--------+--------+--------+-----------------------+--------+ CCA Prox  71      9               smooth and heterogenoustortuous +----------+--------+--------+--------+-----------------------+--------+ CCA Distal57      10              smooth and heterogenous         +----------+--------+--------+--------+-----------------------+--------+ ICA Prox  46      12              smooth and heterogenous         +----------+--------+--------+--------+-----------------------+--------+ ICA Mid   53      13              smooth and heterogenoustortuous +----------+--------+--------+--------+-----------------------+--------+ ICA Distal58      13                                     tortuous +----------+--------+--------+--------+-----------------------+--------+ ECA       52      1                                               +----------+--------+--------+--------+-----------------------+--------+ +----------+--------+--------+--------+-------------------+           PSV  cm/sEDV cm/sDescribeArm Pressure (mmHG) +----------+--------+--------+--------+-------------------+ [id number]                                          +----------+--------+--------+--------+-------------------+ +---------+--------+--+--------+--+---------+ VertebralPSV cm/s43EDV cm/s10Antegrade +---------+--------+--+--------+--+---------+   Summary: Right Carotid: Velocities in the right ICA are consistent with a  1-39% stenosis. Left Carotid: Velocities in the left ICA are consistent with a 1-39% stenosis. Vertebrals: Bilateral vertebral arteries demonstrate antegrade flow. *See table(s) above for measurements and observations.  Electronically signed by Lemar Livings MD on 12/17/2022 at 6:04:15 PM.    Final    ECHOCARDIOGRAM COMPLETE  Result Date: 12/17/2022    ECHOCARDIOGRAM REPORT   Patient Name:   [name] Date of Exam: 12/17/2022 Medical Rec #:  [id number]      Height:       65.0 in Accession #:    [id number]     Weight:       109.2 lb Date of Birth:  September 07, 1924      BSA:          1.530 m Patient Age:    89 years       BP:           146/68 mmHg Patient Gender: F              HR:           80 bpm. Exam Location:  Inpatient Procedure: 2D Echo, Cardiac Doppler and Color Doppler Indications:    Stroke  History:        Patient has prior history of Echocardiogram examinations, most                 recent 05/01/2015. Pulmonary HTN; Risk Factors:Hypertension and                 Dyslipidemia.  Sonographer:    Milda Smart Referring Phys: [id number] [facility] GOEL  Sonographer Comments: Image acquisition challenging due to patient body habitus. IMPRESSIONS  1. Left ventricular ejection fraction, by estimation, is 60 to 65%. The left ventricle has normal function. The left ventricle has no regional wall motion abnormalities. There is mild concentric left ventricular hypertrophy. Left ventricular diastolic parameters are indeterminate.  2. Right ventricular systolic function is normal. The right ventricular size is normal.  3. The mitral valve is degenerative. Mild mitral valve regurgitation. Mild mitral stenosis.  4. Tricuspid valve regurgitation is moderate.  5. The aortic valve is normal in structure. Aortic valve regurgitation is not visualized. No aortic stenosis is present.  6. The inferior vena cava is normal in size with greater than 50% respiratory variability, suggesting right atrial pressure of 3 mmHg. FINDINGS  Left  Ventricle: Left ventricular ejection fraction, by estimation, is 60 to 65%. The left ventricle has normal function. The left ventricle has no regional wall motion abnormalities. The left ventricular internal cavity size was normal in size. There is  mild concentric left ventricular hypertrophy. Left ventricular diastolic parameters are indeterminate. Right Ventricle: The right ventricular size is normal. No increase in right ventricular wall thickness. Right ventricular systolic function is normal. Left Atrium: Left atrial size was normal in size. Right Atrium: Right atrial size was normal in size. Pericardium: There is no evidence of pericardial effusion. Mitral Valve: The mitral valve is degenerative in appearance. Mild mitral valve regurgitation. Mild mitral valve stenosis. MV peak gradient, 2.9 mmHg. The mean mitral valve gradient is  1.0 mmHg. Tricuspid Valve: The tricuspid valve is normal in structure. Tricuspid valve regurgitation is moderate . No evidence of tricuspid stenosis. Aortic Valve: The aortic valve is normal in structure. Aortic valve regurgitation is not visualized. No aortic stenosis is present. Pulmonic Valve: The pulmonic valve was normal in structure. Pulmonic valve regurgitation is not visualized. No evidence of pulmonic stenosis. Aorta: The aortic root is normal in size and structure. Venous: The inferior vena cava is normal in size with greater than 50% respiratory variability, suggesting right atrial pressure of 3 mmHg. IAS/Shunts: No atrial level shunt detected by color flow Doppler.  LEFT VENTRICLE PLAX 2D LVIDd:         3.40 cm     Diastology LVIDs:         2.50 cm     LV e' medial:    3.26 cm/s LV PW:         1.20 cm     LV E/e' medial:  25.2 LV IVS:        1.20 cm     LV e' lateral:   4.68 cm/s LVOT diam:     2.00 cm     LV E/e' lateral: 17.6 LV SV:         61 LV SV Index:   40 LVOT Area:     3.14 cm  LV Volumes (MOD) LV vol d, MOD A2C: 40.0 ml LV vol d, MOD A4C: 35.9 ml LV vol s, MOD  A2C: 15.9 ml LV vol s, MOD A4C: 11.2 ml LV SV MOD A2C:     24.1 ml LV SV MOD A4C:     35.9 ml LV SV MOD BP:      24.5 ml RIGHT VENTRICLE             IVC RV Basal diam:  3.00 cm     IVC diam: 0.90 cm RV S prime:     10.90 cm/s TAPSE (M-mode): 1.7 cm LEFT ATRIUM             Index        RIGHT ATRIUM           Index LA diam:        3.30 cm 2.16 cm/m   RA Area:     11.60 cm LA Vol (A2C):   42.1 ml 27.52 ml/m  RA Volume:   21.10 ml  13.79 ml/m LA Vol (A4C):   30.0 ml 19.61 ml/m LA Biplane Vol: 35.4 ml 23.14 ml/m  AORTIC VALVE LVOT Vmax:   91.90 cm/s LVOT Vmean:  67.300 cm/s LVOT VTI:    0.193 m  AORTA Ao Root diam: 3.10 cm Ao Asc diam:  3.10 cm MITRAL VALVE               TRICUSPID VALVE MV Area (PHT): 3.39 cm    TR Peak grad:   37.5 mmHg MV Area VTI:   2.83 cm    TR Mean grad:   28.0 mmHg MV Peak grad:  2.9 mmHg    TR Vmax:        306.00 cm/s MV Mean grad:  1.0 mmHg    TR Vmean:       256.0 cm/s MV Vmax:       0.85 m/s MV Vmean:      57.6 cm/s   SHUNTS MV Decel Time: 224 msec    Systemic VTI:  0.19 m MR Peak grad: 97.2 mmHg    Systemic Diam: 2.00  cm MR Vmax:      493.00 cm/s MV E velocity: 82.30 cm/s MV A velocity: 81.40 cm/s MV E/A ratio:  1.01 Kardie Tobb DO Electronically signed by Thomasene Ripple DO Signature Date/Time: 12/17/2022/3:31:21 PM    Final    MR ANGIO HEAD WO CONTRAST  Result Date: 12/17/2022 CLINICAL DATA:  89 year old female with TIA and small left corona radiata white matter infarcts on MRI yesterday. EXAM: MRA HEAD WITHOUT CONTRAST TECHNIQUE: Angiographic images of the Circle of Willis were acquired using MRA technique without intravenous contrast. COMPARISON:  Brain MRI 12/16/2022.  Intracranial MRA 12/20/2005. FINDINGS: Anterior circulation: Antegrade flow in the distal cervical ICAs and both ICA siphons. The left siphon appears dominant as in 2007, along with dominant left ACA A1 segment. No ICA siphon stenosis identified. Patent carotid termini, MCA and ACA origins. Chronically dominant left  ACA A2 segment also. Visible ACA branches are within normal limits. Right MCA M1 segment, MCA trifurcation and visible right MCA branches are within normal limits. Left MCA M1 segment remains patent but with new mild to moderate mid M1 irregularity and stenosis since 2007 on series 1018, image 1. Patent left MCA bifurcation without stenosis. Visible left MCA branches appear stable and within normal limits. Posterior circulation: Antegrade flow in the posterior circulation appears stable since 2007 with dominant left vertebral V4 segment. Left PICA origin is patent. Distal vertebral arteries, vertebrobasilar junction and basilar artery appear patent without stenosis. Patent SCA and PCA origins. Posterior communicating arteries are diminutive or absent. Right PCA branches are within normal limits. Mild to moderate new left P1/P2 segment junction irregularity and stenosis since 2007 (series 1030, image 5). Distal left PCA branches remain patent. Anatomic variants: Dominant distal left vertebral artery. Dominant left ICA siphon and left ACA. Other: No intracranial mass effect or ventriculomegaly. IMPRESSION: 1. Negative for large vessel occlusion. 2. Positive for mild to moderate intracranial atherosclerosis and stenosis affecting both the Left MCA M1 segment, also the Left PCA P1/P2, new since a 2007 MRA. Electronically Signed   By: Odessa Fleming M.D.   On: 12/17/2022 07:21   DG Chest 2 View  Result Date: 12/16/2022 CLINICAL DATA:  Stroke. EXAM: CHEST - 2 VIEW COMPARISON:  Earlier today FINDINGS: Bibasilar atelectasis related to lower lung volumes. Stable heart size and mediastinal contours. No pulmonary edema, pleural effusion or pneumothorax. IMPRESSION: Bibasilar atelectasis. Electronically Signed   By: Narda Rutherford M.D.   On: 12/16/2022 21:55   MR BRAIN WO CONTRAST  Result Date: 12/16/2022 CLINICAL DATA:  Initial evaluation for acute TIA. EXAM: MRI HEAD WITHOUT CONTRAST TECHNIQUE: Multiplanar, multiecho pulse  sequences of the brain and surrounding structures were obtained without intravenous contrast. COMPARISON:  Prior study from 10/05/2006. FINDINGS: Brain: Generalized age-related cerebral atrophy. Patchy and confluent T2/FLAIR hyperintensity involving the periventricular and deep white matter both cerebral hemispheres as well as the pons, consistent with chronic small vessel ischemic disease, moderately advanced in nature. Few scatter remote lacunar infarcts present about the deep gray nuclei. Small remote right cerebellar infarct noted. [place] small volume restricted diffusion involving the posterior left basal ganglia/corona radiata, consistent with acute ischemic infarcts (series 5, images 28-30). No associated hemorrhage or mass effect. No other evidence for acute or subacute ischemia. No acute intracranial hemorrhage. Single punctate chronic microhemorrhage noted at the left thalamus. No mass lesion, midline shift or mass effect. No hydrocephalus or extra-axial fluid collection. Pituitary gland and suprasellar region within normal limits. Vascular: Major intracranial vascular flow voids are maintained. Skull  and upper cervical spine: Craniocervical junction with normal limits. Bone marrow signal intensity normal. No scalp soft tissue abnormality. Sinuses/Orbits: Prior bilateral ocular lens replacement. Paranasal sinuses are clear. Moderate bilateral mastoid effusions noted, of doubtful significance. Visualized nasopharynx unremarkable. Other: None. IMPRESSION: 1. Patchy small volume acute ischemic nonhemorrhagic infarct involving the posterior left basal ganglia/corona radiata. 2. Underlying age-related cerebral atrophy with moderate chronic microvascular ischemic disease, with a few scattered remote lacunar infarcts about the deep gray nuclei and right cerebellum. Electronically Signed   By: Rise Mu M.D.   On: 12/16/2022 19:12   DG Chest 1 View  Result Date: 12/16/2022 CLINICAL DATA:  Fall  EXAM: CHEST  1 VIEW COMPARISON:  10/12/2015 FINDINGS: Mild left basilar atelectasis. Lungs are otherwise clear. No pneumothorax or pleural effusion. Cardiac size within normal limits. Pulmonary vascularity is normal. No acute bone abnormality. IMPRESSION: No active disease. Electronically Signed   By: Helyn Numbers M.D.   On: 12/16/2022 17:14     Scheduled Meds:  aspirin EC  81 mg Oral Daily   clopidogrel  75 mg Oral Daily   enoxaparin (LOVENOX) injection  30 mg Subcutaneous Q24H   pravastatin  40 mg Oral q1800   Continuous Infusions:   LOS: 2 days    Time spent: 50 mins    Willeen Niece, MD [facility]   If 7PM-7AM, please contact night-coverage

## 2022-12-19 ENCOUNTER — Inpatient Hospital Stay (HOSPITAL_COMMUNITY): Payer: Medicare Other

## 2022-12-19 DIAGNOSIS — I639 Cerebral infarction, unspecified: Secondary | ICD-10-CM | POA: Diagnosis not present

## 2022-12-19 LAB — GLUCOSE, CAPILLARY: Glucose-Capillary: 207 mg/dL — ABNORMAL HIGH (ref 70–99)

## 2022-12-19 MED ORDER — PRAVASTATIN SODIUM 40 MG PO TABS: 40.0000 mg | ORAL_TABLET | Freq: Every day | ORAL | 2 refills | Status: AC

## 2022-12-19 MED ORDER — ASPIRIN 81 MG PO TBEC: 81.0000 mg | DELAYED_RELEASE_TABLET | Freq: Every day | ORAL | 12 refills | Status: DC

## 2022-12-19 MED ORDER — CLOPIDOGREL BISULFATE 75 MG PO TABS: 75.0000 mg | ORAL_TABLET | Freq: Every day | ORAL | 0 refills | Status: DC

## 2022-12-19 MED ORDER — IOHEXOL 350 MG/ML SOLN
100.0000 mL | Freq: Once | INTRAVENOUS | Status: AC | PRN
  Administered 2022-12-19: 100 mL via INTRAVENOUS

## 2022-12-19 NOTE — Progress Notes (Signed)
PROGRESS NOTE    [name]  WJX:[id number] DOB: [birth year] DOA: 12/16/2022 PCP: Venita Sheffield, MD   Brief Narrative:  This 89 y.o. female with fairly good functional status, almost independent.  Patient was noted by staff at the living facility for the last 2 or 3 days to have a gait disturbance/unsteadiness.  There has been no fall in the last 2 or 3 days, although patient did have a fall about a week ago with some injury to the right elbow skin area.  Patient has had no fever,  nausea, vomiting ,diarrhea or headache.  Patient was sent to the ER for further evaluation of her gait disturbance. Workup in the ER has revealed an acute  CVA. Medical evaluation is sought.   Assessment & Plan:   Principal Problem:   Stroke [facility]) Active Problems:   Stroke (cerebrum) (HCC)  Stroke (cerebrum) (HCC) -MRI showed patchy small volume acute ischemic nonhemorrhagic infarct involving post L basal ganglia/corona radiata. -Neurology/stroke team consulted.  -2d echo reviewed. Normal LVEF with no atrial level shunt detected. -B carotids patent. -Per Stroke MD, recs thus far for DAPT x 90 days then ASA 81mg  daily  -Also recommended for 30 day heart monitor -Therapy recs for SNF noted. -Patient has worsening weakness on the right side. -Stroke team  re-consulted for assessment.   Hyper Lipidemia: -LDL 64 -cont pravastatin   HTN: -bp stable -Home bp med on hold to allow permissive htn.   Pulm HTN -seems stable at this time   DVT prophylaxis: Lovenox Code Status: Full code Family Communication:No family at bed side. Disposition Plan:     Status is: Inpatient Remains inpatient appropriate because: Admitted for left basal ganglia stroke.  Neurology consulted started on DAPT.  Therapy recommended SNF.  Awaiting insurance authorization  Consultants:  Neurology  Procedures: MRI Brain.  Antimicrobials:  Anti-infectives (From admission, onward)    None         Subjective: Patient seen and examined at bedside.  Overnight events noted.   Patient appears very deconditioned, elderly, reports more pronounced weakness. Right side appears very weak as compared to prior yesterday.  Objective: Vitals:   12/19/22 0406 12/19/22 1113 12/19/22 1114 12/19/22 1239  BP: 133/71 (!) 140/83 (!) 140/83 138/78  Pulse: 71 85 87 98  Resp:   19 16  Temp: (!) 97.5 F (36.4 C) 98.4 F (36.9 C) 98.4 F (36.9 C) 98.9 F (37.2 C)  TempSrc: Oral Oral Oral Oral  SpO2: 97% 98% 97% 98%    Intake/Output Summary (Last 24 hours) at 12/19/2022 1309 Last data filed at 12/19/2022 0900 Gross per 24 hour  Intake 80 ml  Output --  Net 80 ml   There were no vitals filed for this visit.  Examination:  General exam: Appears calm and comfortable , deconditioned, elderly, not in any distress. Respiratory system: CTA bilaterally.  Respiratory effort normal. RR 14 Cardiovascular system: S1 & S2 heard, RRR. No JVD, murmurs, rubs, gallops or clicks. No pedal edema. Gastrointestinal system: Abdomen is non distended, soft and non tender. Normal bowel sounds heard. Central nervous system: Alert and oriented X 2. No focal neurological deficits. Extremities: Right-sided weakness, No edema, No cyanosis, no clubbing. Skin: No rashes, lesions or ulcers Psychiatry: Judgement and insight appear normal. Mood & affect appropriate.     Data Reviewed: I have personally reviewed following labs and imaging studies  CBC: Recent Labs  Lab 12/16/22 1447 12/16/22 2230 12/18/22 0506  WBC 8.2 9.6 8.8  NEUTROABS 4.9  --   --  HGB 12.5 13.0 13.4  HCT 38.4 39.4 39.3  MCV 91.9 90.4 88.5  PLT 261 249 256   Basic Metabolic Panel: Recent Labs  Lab 12/16/22 1447 12/16/22 2230 12/18/22 0506  NA 134*  --  136  K 4.5  --  3.6  CL 101  --  102  CO2 25  --  25  GLUCOSE 101*  --  101*  BUN 24*  --  29*  CREATININE 1.22* 1.34* 1.11*  CALCIUM 8.8*  --  9.1   GFR: Estimated  Creatinine Clearance: 23.2 mL/min (A) (by C-G formula based on SCr of 1.11 mg/dL (H)). Liver Function Tests: Recent Labs  Lab 12/16/22 1447 12/18/22 0506  AST 17 19  ALT 11 12  ALKPHOS 51 53  BILITOT 0.6 1.1  PROT 6.2* 5.6*  ALBUMIN 3.5 3.2*   No results for input(s): "LIPASE", "AMYLASE" in the last 168 hours. No results for input(s): "AMMONIA" in the last 168 hours. Coagulation Profile: No results for input(s): "INR", "PROTIME" in the last 168 hours. Cardiac Enzymes: No results for input(s): "CKTOTAL", "CKMB", "CKMBINDEX", "TROPONINI" in the last 168 hours. BNP (last 3 results) No results for input(s): "PROBNP" in the last 8760 hours. HbA1C: Recent Labs    12/16/22 2230  HGBA1C 5.6   CBG: Recent Labs  Lab 12/19/22 1258  GLUCAP 207*   Lipid Profile: Recent Labs    12/17/22 0614  CHOL 133  HDL 56  LDLCALC 64  TRIG 64  CHOLHDL 2.4   Thyroid Function Tests: No results for input(s): "TSH", "T4TOTAL", "FREET4", "T3FREE", "THYROIDAB" in the last 72 hours. Anemia Panel: Recent Labs    12/17/22 0614  VITAMINB12 142*   Sepsis Labs: No results for input(s): "PROCALCITON", "LATICACIDVEN" in the last 168 hours.  Recent Results (from the past 240 hour(s))  MRSA Next Gen by PCR, Nasal     Status: None   Collection Time: 12/17/22  5:51 PM   Specimen: Nasal Mucosa; Nasal Swab  Result Value Ref Range Status   MRSA by PCR Next Gen NOT DETECTED NOT DETECTED Final    Comment: (NOTE) The GeneXpert MRSA Assay (FDA approved for NASAL specimens only), is one component of a comprehensive MRSA colonization surveillance program. It is not intended to diagnose MRSA infection nor to guide or monitor treatment for MRSA infections. Test performance is not FDA approved in patients less than 15 years old. Performed at [facility], [address], [place], [place] [address]     Radiology Studies: ECHOCARDIOGRAM COMPLETE  Result Date: 12/17/2022    ECHOCARDIOGRAM REPORT    Patient Name:   [name] Date of Exam: 12/17/2022 Medical Rec #:  [id number]      Height:       65.0 in Accession #:    [id number]     Weight:       109.2 lb Date of Birth:  02-19-1925      BSA:          1.530 m Patient Age:    89 years       BP:           146/68 mmHg Patient Gender: F              HR:           80 bpm. Exam Location:  Inpatient Procedure: 2D Echo, Cardiac Doppler and Color Doppler Indications:    Stroke  History:        Patient has prior  history of Echocardiogram examinations, most                 recent 05/01/2015. Pulmonary HTN; Risk Factors:Hypertension and                 Dyslipidemia.  Sonographer:    Milda Smart Referring Phys: [id number] [facility] GOEL  Sonographer Comments: Image acquisition challenging due to patient body habitus. IMPRESSIONS  1. Left ventricular ejection fraction, by estimation, is 60 to 65%. The left ventricle has normal function. The left ventricle has no regional wall motion abnormalities. There is mild concentric left ventricular hypertrophy. Left ventricular diastolic parameters are indeterminate.  2. Right ventricular systolic function is normal. The right ventricular size is normal.  3. The mitral valve is degenerative. Mild mitral valve regurgitation. Mild mitral stenosis.  4. Tricuspid valve regurgitation is moderate.  5. The aortic valve is normal in structure. Aortic valve regurgitation is not visualized. No aortic stenosis is present.  6. The inferior vena cava is normal in size with greater than 50% respiratory variability, suggesting right atrial pressure of 3 mmHg. FINDINGS  Left Ventricle: Left ventricular ejection fraction, by estimation, is 60 to 65%. The left ventricle has normal function. The left ventricle has no regional wall motion abnormalities. The left ventricular internal cavity size was normal in size. There is  mild concentric left ventricular hypertrophy. Left ventricular diastolic parameters are indeterminate. Right Ventricle: The right  ventricular size is normal. No increase in right ventricular wall thickness. Right ventricular systolic function is normal. Left Atrium: Left atrial size was normal in size. Right Atrium: Right atrial size was normal in size. Pericardium: There is no evidence of pericardial effusion. Mitral Valve: The mitral valve is degenerative in appearance. Mild mitral valve regurgitation. Mild mitral valve stenosis. MV peak gradient, 2.9 mmHg. The mean mitral valve gradient is 1.0 mmHg. Tricuspid Valve: The tricuspid valve is normal in structure. Tricuspid valve regurgitation is moderate . No evidence of tricuspid stenosis. Aortic Valve: The aortic valve is normal in structure. Aortic valve regurgitation is not visualized. No aortic stenosis is present. Pulmonic Valve: The pulmonic valve was normal in structure. Pulmonic valve regurgitation is not visualized. No evidence of pulmonic stenosis. Aorta: The aortic root is normal in size and structure. Venous: The inferior vena cava is normal in size with greater than 50% respiratory variability, suggesting right atrial pressure of 3 mmHg. IAS/Shunts: No atrial level shunt detected by color flow Doppler.  LEFT VENTRICLE PLAX 2D LVIDd:         3.40 cm     Diastology LVIDs:         2.50 cm     LV e' medial:    3.26 cm/s LV PW:         1.20 cm     LV E/e' medial:  25.2 LV IVS:        1.20 cm     LV e' lateral:   4.68 cm/s LVOT diam:     2.00 cm     LV E/e' lateral: 17.6 LV SV:         61 LV SV Index:   40 LVOT Area:     3.14 cm  LV Volumes (MOD) LV vol d, MOD A2C: 40.0 ml LV vol d, MOD A4C: 35.9 ml LV vol s, MOD A2C: 15.9 ml LV vol s, MOD A4C: 11.2 ml LV SV MOD A2C:     24.1 ml LV SV MOD A4C:     35.9 ml  LV SV MOD BP:      24.5 ml RIGHT VENTRICLE             IVC RV Basal diam:  3.00 cm     IVC diam: 0.90 cm RV S prime:     10.90 cm/s TAPSE (M-mode): 1.7 cm LEFT ATRIUM             Index        RIGHT ATRIUM           Index LA diam:        3.30 cm 2.16 cm/m   RA Area:     11.60 cm LA  Vol (A2C):   42.1 ml 27.52 ml/m  RA Volume:   21.10 ml  13.79 ml/m LA Vol (A4C):   30.0 ml 19.61 ml/m LA Biplane Vol: 35.4 ml 23.14 ml/m  AORTIC VALVE LVOT Vmax:   91.90 cm/s LVOT Vmean:  67.300 cm/s LVOT VTI:    0.193 m  AORTA Ao Root diam: 3.10 cm Ao Asc diam:  3.10 cm MITRAL VALVE               TRICUSPID VALVE MV Area (PHT): 3.39 cm    TR Peak grad:   37.5 mmHg MV Area VTI:   2.83 cm    TR Mean grad:   28.0 mmHg MV Peak grad:  2.9 mmHg    TR Vmax:        306.00 cm/s MV Mean grad:  1.0 mmHg    TR Vmean:       256.0 cm/s MV Vmax:       0.85 m/s MV Vmean:      57.6 cm/s   SHUNTS MV Decel Time: 224 msec    Systemic VTI:  0.19 m MR Peak grad: 97.2 mmHg    Systemic Diam: 2.00 cm MR Vmax:      493.00 cm/s MV E velocity: 82.30 cm/s MV A velocity: 81.40 cm/s MV E/A ratio:  1.01 Kardie Tobb DO Electronically signed by Thomasene Ripple DO Signature Date/Time: 12/17/2022/3:31:21 PM    Final      Scheduled Meds:  aspirin EC  81 mg Oral Daily   clopidogrel  75 mg Oral Daily   enoxaparin (LOVENOX) injection  30 mg Subcutaneous Q24H   pravastatin  40 mg Oral q1800   Continuous Infusions:   LOS: 3 days    Time spent: 35 mins    Willeen Niece, MD [facility]   If 7PM-7AM, please contact night-coverage

## 2022-12-19 NOTE — Progress Notes (Signed)
Pt assisted to chair by PT.  Significantly weaker on right side.  MD notified of change at 1155.  NIH 11 at this time.  MD at bedside.  Code stroke called and pt transported to radiology.

## 2022-12-19 NOTE — Discharge Summary (Signed)
Physician Discharge Summary  [name] ZOX:[id number] DOB: 12/12/1924 DOA: 12/16/2022  PCP: Venita Sheffield, MD  Admit date: 12/16/2022  Discharge date: 12/20/2022  Admitted From: Home.  Disposition:  SNF  Recommendations for Outpatient Follow-up:  Follow up with PCP in 1-2 weeks. Please obtain BMP/CBC in one week Advised to follow-up with Neurology [facility] in 8 weeks. Advised to take aspirin and Plavix for 90 days followed by Plavix only therapy. Patient being discharged to skilled nursing facility for rehab.  Home Health: None Equipment/Devices:None  Discharge Condition: Stable CODE STATUS:Full code Diet recommendation: Heart Healthy  Brief [facility] Course: This 89 yrs old female with fairly good functional status, almost independent.  Patient was noted by staff at the living facility for the last 2 or 3 days to have a gait disturbance / unsteadiness. There has been no fall in the last 2 or 3 days, although patient did have a fall about a week ago with some injury to the right elbow skin area.  Patient has had no fever,  nausea, vomiting ,diarrhea or headache.  Patient was sent to the ER for further evaluation of her gait disturbance. Workup in the ER has revealed an acute CVA. Patient was evaluated by stroke team, had echocardiogram, carotid duplex. Neurology has recommended dual antiplatelet therapy for 90 days followed by Plavix only therapy.During hospitalization Patient has worsening weakness on the right side.  Repeat CT, CTA head and neck and MRI shows extension of the prior stroke. Neurologist recommended continue current management.  Patient is being discharged to skilled nursing facility for rehab.   Discharge Diagnoses:  Principal Problem:   Stroke [facility]) Active Problems:   Stroke (cerebrum) (HCC)  Stroke (cerebrum) (HCC) -MRI showed patchy small volume acute ischemic nonhemorrhagic infarct involving post L basal ganglia/corona  radiata. -Neurology/stroke team consulted.  -2d echo reviewed. Normal LVEF with no atrial level shunt detected. -B carotids patent. -Per Stroke MD, recs thus far for DAPT x 90 days then ASA 81mg  daily  -Also recommended for 30 day heart monitor -Therapy recs for SNF noted. -Patient has worsening weakness on the right side, repeat MRI shows extension of the prior stroke. -Neurology recommended continue same management.  Patient is being discharged to rehab.   Hyper Lipidemia: -LDL 64 -cont pravastatin   HTN: -bp stable -Home bp med on hold to allow permissive htn.   Pulm HTN -seems stable at this time.  Discharge Instructions  Discharge Instructions     Call MD for:  difficulty breathing, headache or visual disturbances   Complete by: As directed    Call MD for:  persistant dizziness or light-headedness   Complete by: As directed    Call MD for:  persistant nausea and vomiting   Complete by: As directed    Diet - low sodium heart healthy   Complete by: As directed    Diet - low sodium heart healthy   Complete by: As directed    Diet Carb Modified   Complete by: As directed    Discharge instructions   Complete by: As directed    Advised to follow-up with primary care physician in 1 week. Advised to follow-up with neurology [facility] in 8 weeks. Advised to take aspirin and Plavix for 90 days followed by Plavix only therapy. Patient being discharged to skilled nursing facility for rehab.   Discharge wound care:   Complete by: As directed    Follow up Wound care at PCP office.   Increase  activity slowly   Complete by: As directed    No wound care   Complete by: As directed       Allergies as of 12/20/2022       Reactions   Doxycycline Itching   Lipitor [atorvastatin] Other (See Comments)   PAIN IN LEGS   Pantoprazole Itching   Pepcid [famotidine] Itching        Medication List     STOP taking these medications    cholecalciferol  1000 units tablet Commonly known as: VITAMIN D   docusate sodium 100 MG capsule Commonly known as: COLACE   HYDROcodone-acetaminophen 5-325 MG tablet Commonly known as: NORCO/VICODIN   Magnesium 250 MG Tabs   metoprolol succinate 25 MG 24 hr tablet Commonly known as: TOPROL-XL   rosuvastatin 5 MG tablet Commonly known as: CRESTOR       TAKE these medications    aspirin EC 81 MG tablet Take 1 tablet (81 mg total) by mouth daily. Swallow whole.   clopidogrel 75 MG tablet Commonly known as: PLAVIX Take 1 tablet (75 mg total) by mouth daily.   pravastatin 40 MG tablet Commonly known as: PRAVACHOL Take 1 tablet (40 mg total) by mouth daily at 6 PM.               Discharge Care Instructions  (From admission, onward)           Start     Ordered   12/19/22 0000  Discharge wound care:       Comments: Follow up Wound care at PCP office.   12/19/22 1021            Follow-up Information     Venita Sheffield, MD Follow up in 1 week(s).   Specialty: Internal Medicine Contact information: [address] [place] [address] [phone]         [facility] Follow up in 8 week(s).   Contact information: [address] [place] [address] [phone]                Allergies  Allergen Reactions   Doxycycline Itching   Lipitor [Atorvastatin] Other (See Comments)    PAIN IN LEGS   Pantoprazole Itching   Pepcid [Famotidine] Itching    Consultations: Neurology   Procedures/Studies: MR BRAIN WO CONTRAST  Result Date: 12/20/2022 CLINICAL DATA:  Initial evaluation for neuro deficit, stroke, weakness. EXAM: MRI HEAD WITHOUT CONTRAST TECHNIQUE: Multiplanar, multiecho pulse sequences of the brain and surrounding structures were obtained without intravenous contrast. COMPARISON:  Prior CTs from earlier the same day as well as recent MRI from 12/16/2022. FINDINGS: Brain: Examination moderately  degraded by motion artifact. Age-related cerebral atrophy with moderate chronic microvascular ischemic disease. Few scattered remote lacunar infarcts again noted about the bilateral basal ganglia. Small remote right cerebellar infarct. Previously identified infarct involving the posterior left basal ganglia/corona radiata has increased in size from previous, now measuring up to approximately 2.3 cm in size (series 2, image 29). No associated hemorrhage or mass effect. No other acute or subacute infarct elsewhere within the brain. Gray-white matter differentiation maintained. No visible acute or chronic intracranial blood products. No mass lesion, midline shift or mass effect. No hydrocephalus or extra-axial fluid collection. Pituitary gland suprasellar region within normal limits. Vascular: Major intracranial vascular flow voids are maintained. Skull and upper cervical spine: Craniocervical junction within normal limits. Bone marrow signal intensity normal. No scalp soft tissue abnormality. Sinuses/Orbits: Prior bilateral ocular lens replacement. Paranasal  sinuses are largely clear. Moderate bilateral mastoid effusions noted, of doubtful significance. Visualized nasopharynx unremarkable. Other: None. IMPRESSION: 1. Mild interval expansion of previously identified infarct involving the left basal ganglia/corona radiata, now measuring up to 2.3 cm in size. No associated hemorrhage or mass effect. 2. No other new acute intracranial abnormality. 3. Underlying atrophy with moderate chronic microvascular ischemic disease, with a few scattered remote lacunar infarcts about the bilateral basal ganglia and right cerebellum. Electronically Signed   By: Rise Mu M.D.   On: 12/20/2022 05:22   CT ANGIO HEAD NECK W WO CM W PERF (CODE STROKE)  Result Date: 12/19/2022 CLINICAL DATA:  Neuro deficit, acute, stroke suspected. EXAM: CT ANGIOGRAPHY HEAD AND NECK CT PERFUSION BRAIN TECHNIQUE: Multidetector CT imaging of  the head and neck was performed using the standard protocol during bolus administration of intravenous contrast. Multiplanar CT image reconstructions and MIPs were obtained to evaluate the vascular anatomy. Carotid stenosis measurements (when applicable) are obtained utilizing NASCET criteria, using the distal internal carotid diameter as the denominator. Multiphase CT imaging of the brain was performed following IV bolus contrast injection. Subsequent parametric perfusion maps were calculated using RAPID software. RADIATION DOSE REDUCTION: This exam was performed according to the departmental dose-optimization program which includes automated exposure control, adjustment of the mA and/or kV according to patient size and/or use of iterative reconstruction technique. CONTRAST:  OMNIPAQUE IOHEXOL 350 MG/ML SOLN COMPARISON:  CT head today. FINDINGS: CTA NECK FINDINGS Aortic arch: Great vessel origins are incompletely imaged. Visualized great vessels are patent. Right carotid system: No evidence of dissection, stenosis (50% or greater) or occlusion. Left carotid system: No evidence of dissection, stenosis (50% or greater) or occlusion. Vertebral arteries: Left dominant. No evidence of dissection, stenosis (50% or greater) or occlusion. Skeleton: No acute abnormality limits assessment. Multilevel degenerative change. Other neck: No acute abnormality on limited assessment. Upper chest: Visualized lung apices are clear. Review of the MIP images confirms the above findings CTA HEAD FINDINGS Anterior circulation: Bilateral intracranial ICAs, MCAs and ACAs are patent without proximal high-grade stenosis. Moderate left M1 MCA stenosis. Asymmetrically small right ICA, MCA and ACA, likely congenital. Posterior circulation: Bilateral intradural vertebral arteries and basilar artery are patent. Severe left P2 PCA stenosis. Venous sinuses: Nondiagnostic evaluation given arterial timing. Review of the MIP images confirms the  above findings CT Brain Perfusion Findings: ASPECTS: 10. CBF (<30%) Volume: 0mL Perfusion (Tmax>6.0s) volume: 0mL Mismatch Volume: 0mL Infarction Location:None identified IMPRESSION: 1. No emergent large vessel occlusion. 2. Severe left P2 PCA stenosis. 3. Moderate left M1 MCA stenosis. 4. No evidence of core infarct or penumbra on CT perfusion. Electronically Signed   By: Feliberto Harts M.D.   On: 12/19/2022 13:51   CT HEAD CODE STROKE WO CONTRAST  Result Date: 12/19/2022 CLINICAL DATA:  Code stroke.  Right-sided weakness EXAM: CT HEAD WITHOUT CONTRAST TECHNIQUE: Contiguous axial images were obtained from the base of the skull through the vertex without intravenous contrast. RADIATION DOSE REDUCTION: This exam was performed according to the departmental dose-optimization program which includes automated exposure control, adjustment of the mA and/or kV according to patient size and/or use of iterative reconstruction technique. COMPARISON:  Brain MR 12/16/22 FINDINGS: Brain: No hemorrhage. No hydrocephalus. No extra-axial fluid collection. Sequela of severe chronic microvascular ischemic change no mass effect. No mass lesion. Possible extension of previously seen left corona radiata infarcts (series 2, image 19). Vascular: No hyperdense vessel or unexpected calcification. Skull: Normal. Negative for fracture or focal lesion. Sinuses/Orbits:  Trace left mastoid effusion. No middle ear effusion. Paranasal sinuses are mucosal thickening in the posterior ethmoid air cells on the left. Bilateral lens replacement. Orbits are otherwise unremarkable. Other: None. ASPECTS ([place] Stroke Program Early CT Score): 10 IMPRESSION: 1. Possible extension of previously seen left corona radiata infarct. 2. Sequela of severe chronic microvascular ischemic change. 3. No hemorrhage. Findings were paged to Dr. Viviann Spare on 12/19/22 at 1:17 PM. Electronically Signed   By: Lorenza Cambridge M.D.   On: 12/19/2022 13:17   VAS US  CAROTID  Result Date: 12/17/2022 Carotid Arterial Duplex Study Patient Name:  CLOA BUSHONG  Date of Exam:   12/17/2022 Medical Rec #: [id number]       Accession #:    [id number] Date of Birth: [birth year]       Patient Gender: F Patient Age:   32 years Exam Location:  [facility] Procedure:      VAS US CAROTID Referring Phys: [facility] GOEL --------------------------------------------------------------------------------  Indications:       Bruit, carotid R09.89. Risk Factors:      Hyperlipidemia. Comparison Study:  No prior studies. Performing Technologist: Chanda Busing RVT  Examination Guidelines: A complete evaluation includes B-mode imaging, spectral Doppler, color Doppler, and power Doppler as needed of all accessible portions of each vessel. Bilateral testing is considered an integral part of a complete examination. Limited examinations for reoccurring indications may be performed as noted.  Right Carotid Findings: +----------+--------+--------+--------+-----------------------+--------+           PSV cm/sEDV cm/sStenosisPlaque Description     Comments +----------+--------+--------+--------+-----------------------+--------+ CCA Prox  62      6               smooth and heterogenous         +----------+--------+--------+--------+-----------------------+--------+ CCA Distal61      11              smooth and heterogenous         +----------+--------+--------+--------+-----------------------+--------+ ICA Prox  47      11              smooth and heterogenous         +----------+--------+--------+--------+-----------------------+--------+ ICA Mid   56      9                                               +----------+--------+--------+--------+-----------------------+--------+ ICA Distal54      8                                      tortuous +----------+--------+--------+--------+-----------------------+--------+ ECA       89      0                                                +----------+--------+--------+--------+-----------------------+--------+ +----------+--------+-------+--------+-------------------+           PSV cm/sEDV cmsDescribeArm Pressure (mmHG) +----------+--------+-------+--------+-------------------+ [id number]                                         +----------+--------+-------+--------+-------------------+ +---------+--------+--+--------+-+---------+  VertebralPSV cm/s46EDV cm/s9Antegrade +---------+--------+--+--------+-+---------+  Left Carotid Findings: +----------+--------+--------+--------+-----------------------+--------+           PSV cm/sEDV cm/sStenosisPlaque Description     Comments +----------+--------+--------+--------+-----------------------+--------+ CCA Prox  71      9               smooth and heterogenoustortuous +----------+--------+--------+--------+-----------------------+--------+ CCA Distal57      10              smooth and heterogenous         +----------+--------+--------+--------+-----------------------+--------+ ICA Prox  46      12              smooth and heterogenous         +----------+--------+--------+--------+-----------------------+--------+ ICA Mid   53      13              smooth and heterogenoustortuous +----------+--------+--------+--------+-----------------------+--------+ ICA Distal58      13                                     tortuous +----------+--------+--------+--------+-----------------------+--------+ ECA       52      1                                               +----------+--------+--------+--------+-----------------------+--------+ +----------+--------+--------+--------+-------------------+           PSV cm/sEDV cm/sDescribeArm Pressure (mmHG) +----------+--------+--------+--------+-------------------+ [id number]                                          +----------+--------+--------+--------+-------------------+  +---------+--------+--+--------+--+---------+ VertebralPSV cm/s43EDV cm/s10Antegrade +---------+--------+--+--------+--+---------+   Summary: Right Carotid: Velocities in the right ICA are consistent with a 1-39% stenosis. Left Carotid: Velocities in the left ICA are consistent with a 1-39% stenosis. Vertebrals: Bilateral vertebral arteries demonstrate antegrade flow. *See table(s) above for measurements and observations.  Electronically signed by Lemar Livings MD on 12/17/2022 at 6:04:15 PM.    Final    ECHOCARDIOGRAM COMPLETE  Result Date: 12/17/2022    ECHOCARDIOGRAM REPORT   Patient Name:   SHAIANN MCMANAMON Date of Exam: 12/17/2022 Medical Rec #:  [id number]      Height:       65.0 in Accession #:    [id number]     Weight:       109.2 lb Date of Birth:  08-27-1924      BSA:          1.530 m Patient Age:    89 years       BP:           146/68 mmHg Patient Gender: F              HR:           80 bpm. Exam Location:  Inpatient Procedure: 2D Echo, Cardiac Doppler and Color Doppler Indications:    Stroke  History:        Patient has prior history of Echocardiogram examinations, most                 recent 05/01/2015. Pulmonary HTN; Risk Factors:Hypertension and  Dyslipidemia.  Sonographer:    Milda Smart Referring Phys: [id number] [facility] GOEL  Sonographer Comments: Image acquisition challenging due to patient body habitus. IMPRESSIONS  1. Left ventricular ejection fraction, by estimation, is 60 to 65%. The left ventricle has normal function. The left ventricle has no regional wall motion abnormalities. There is mild concentric left ventricular hypertrophy. Left ventricular diastolic parameters are indeterminate.  2. Right ventricular systolic function is normal. The right ventricular size is normal.  3. The mitral valve is degenerative. Mild mitral valve regurgitation. Mild mitral stenosis.  4. Tricuspid valve regurgitation is moderate.  5. The aortic valve is normal in structure. Aortic valve  regurgitation is not visualized. No aortic stenosis is present.  6. The inferior vena cava is normal in size with greater than 50% respiratory variability, suggesting right atrial pressure of 3 mmHg. FINDINGS  Left Ventricle: Left ventricular ejection fraction, by estimation, is 60 to 65%. The left ventricle has normal function. The left ventricle has no regional wall motion abnormalities. The left ventricular internal cavity size was normal in size. There is  mild concentric left ventricular hypertrophy. Left ventricular diastolic parameters are indeterminate. Right Ventricle: The right ventricular size is normal. No increase in right ventricular wall thickness. Right ventricular systolic function is normal. Left Atrium: Left atrial size was normal in size. Right Atrium: Right atrial size was normal in size. Pericardium: There is no evidence of pericardial effusion. Mitral Valve: The mitral valve is degenerative in appearance. Mild mitral valve regurgitation. Mild mitral valve stenosis. MV peak gradient, 2.9 mmHg. The mean mitral valve gradient is 1.0 mmHg. Tricuspid Valve: The tricuspid valve is normal in structure. Tricuspid valve regurgitation is moderate . No evidence of tricuspid stenosis. Aortic Valve: The aortic valve is normal in structure. Aortic valve regurgitation is not visualized. No aortic stenosis is present. Pulmonic Valve: The pulmonic valve was normal in structure. Pulmonic valve regurgitation is not visualized. No evidence of pulmonic stenosis. Aorta: The aortic root is normal in size and structure. Venous: The inferior vena cava is normal in size with greater than 50% respiratory variability, suggesting right atrial pressure of 3 mmHg. IAS/Shunts: No atrial level shunt detected by color flow Doppler.  LEFT VENTRICLE PLAX 2D LVIDd:         3.40 cm     Diastology LVIDs:         2.50 cm     LV e' medial:    3.26 cm/s LV PW:         1.20 cm     LV E/e' medial:  25.2 LV IVS:        1.20 cm     LV e'  lateral:   4.68 cm/s LVOT diam:     2.00 cm     LV E/e' lateral: 17.6 LV SV:         61 LV SV Index:   40 LVOT Area:     3.14 cm  LV Volumes (MOD) LV vol d, MOD A2C: 40.0 ml LV vol d, MOD A4C: 35.9 ml LV vol s, MOD A2C: 15.9 ml LV vol s, MOD A4C: 11.2 ml LV SV MOD A2C:     24.1 ml LV SV MOD A4C:     35.9 ml LV SV MOD BP:      24.5 ml RIGHT VENTRICLE             IVC RV Basal diam:  3.00 cm     IVC diam: 0.90 cm RV S prime:  10.90 cm/s TAPSE (M-mode): 1.7 cm LEFT ATRIUM             Index        RIGHT ATRIUM           Index LA diam:        3.30 cm 2.16 cm/m   RA Area:     11.60 cm LA Vol (A2C):   42.1 ml 27.52 ml/m  RA Volume:   21.10 ml  13.79 ml/m LA Vol (A4C):   30.0 ml 19.61 ml/m LA Biplane Vol: 35.4 ml 23.14 ml/m  AORTIC VALVE LVOT Vmax:   91.90 cm/s LVOT Vmean:  67.300 cm/s LVOT VTI:    0.193 m  AORTA Ao Root diam: 3.10 cm Ao Asc diam:  3.10 cm MITRAL VALVE               TRICUSPID VALVE MV Area (PHT): 3.39 cm    TR Peak grad:   37.5 mmHg MV Area VTI:   2.83 cm    TR Mean grad:   28.0 mmHg MV Peak grad:  2.9 mmHg    TR Vmax:        306.00 cm/s MV Mean grad:  1.0 mmHg    TR Vmean:       256.0 cm/s MV Vmax:       0.85 m/s MV Vmean:      57.6 cm/s   SHUNTS MV Decel Time: 224 msec    Systemic VTI:  0.19 m MR Peak grad: 97.2 mmHg    Systemic Diam: 2.00 cm MR Vmax:      493.00 cm/s MV E velocity: 82.30 cm/s MV A velocity: 81.40 cm/s MV E/A ratio:  1.01 Kardie Tobb DO Electronically signed by Thomasene Ripple DO Signature Date/Time: 12/17/2022/3:31:21 PM    Final    MR ANGIO HEAD WO CONTRAST  Result Date: 12/17/2022 CLINICAL DATA:  89 year old female with TIA and small left corona radiata white matter infarcts on MRI yesterday. EXAM: MRA HEAD WITHOUT CONTRAST TECHNIQUE: Angiographic images of the Circle of Willis were acquired using MRA technique without intravenous contrast. COMPARISON:  Brain MRI 12/16/2022.  Intracranial MRA 12/20/2005. FINDINGS: Anterior circulation: Antegrade flow in the distal cervical  ICAs and both ICA siphons. The left siphon appears dominant as in 2007, along with dominant left ACA A1 segment. No ICA siphon stenosis identified. Patent carotid termini, MCA and ACA origins. Chronically dominant left ACA A2 segment also. Visible ACA branches are within normal limits. Right MCA M1 segment, MCA trifurcation and visible right MCA branches are within normal limits. Left MCA M1 segment remains patent but with new mild to moderate mid M1 irregularity and stenosis since 2007 on series 1018, image 1. Patent left MCA bifurcation without stenosis. Visible left MCA branches appear stable and within normal limits. Posterior circulation: Antegrade flow in the posterior circulation appears stable since 2007 with dominant left vertebral V4 segment. Left PICA origin is patent. Distal vertebral arteries, vertebrobasilar junction and basilar artery appear patent without stenosis. Patent SCA and PCA origins. Posterior communicating arteries are diminutive or absent. Right PCA branches are within normal limits. Mild to moderate new left P1/P2 segment junction irregularity and stenosis since 2007 (series 1030, image 5). Distal left PCA branches remain patent. Anatomic variants: Dominant distal left vertebral artery. Dominant left ICA siphon and left ACA. Other: No intracranial mass effect or ventriculomegaly. IMPRESSION: 1. Negative for large vessel occlusion. 2. Positive for mild to moderate intracranial atherosclerosis and stenosis affecting both the Left  MCA M1 segment, also the Left PCA P1/P2, new since a 2007 MRA. Electronically Signed   By: Odessa Fleming M.D.   On: 12/17/2022 07:21   DG Chest 2 View  Result Date: 12/16/2022 CLINICAL DATA:  Stroke. EXAM: CHEST - 2 VIEW COMPARISON:  Earlier today FINDINGS: Bibasilar atelectasis related to lower lung volumes. Stable heart size and mediastinal contours. No pulmonary edema, pleural effusion or pneumothorax. IMPRESSION: Bibasilar atelectasis. Electronically Signed   By:  Narda Rutherford M.D.   On: 12/16/2022 21:55   MR BRAIN WO CONTRAST  Result Date: 12/16/2022 CLINICAL DATA:  Initial evaluation for acute TIA. EXAM: MRI HEAD WITHOUT CONTRAST TECHNIQUE: Multiplanar, multiecho pulse sequences of the brain and surrounding structures were obtained without intravenous contrast. COMPARISON:  Prior study from 10/05/2006. FINDINGS: Brain: Generalized age-related cerebral atrophy. Patchy and confluent T2/FLAIR hyperintensity involving the periventricular and deep white matter both cerebral hemispheres as well as the pons, consistent with chronic small vessel ischemic disease, moderately advanced in nature. Few scatter remote lacunar infarcts present about the deep gray nuclei. Small remote right cerebellar infarct noted. She Patchy small volume restricted diffusion involving the posterior left basal ganglia/corona radiata, consistent with acute ischemic infarcts (series 5, images 28-30). No associated hemorrhage or mass effect. No other evidence for acute or subacute ischemia. No acute intracranial hemorrhage. Single punctate chronic microhemorrhage noted at the left thalamus. No mass lesion, midline shift or mass effect. No hydrocephalus or extra-axial fluid collection. Pituitary gland and suprasellar region within normal limits. Vascular: Major intracranial vascular flow voids are maintained. Skull and upper cervical spine: Craniocervical junction with normal limits. Bone marrow signal intensity normal. No scalp soft tissue abnormality. Sinuses/Orbits: Prior bilateral ocular lens replacement. Paranasal sinuses are clear. Moderate bilateral mastoid effusions noted, of doubtful significance. Visualized nasopharynx unremarkable. Other: None. IMPRESSION: 1. Patchy small volume acute ischemic nonhemorrhagic infarct involving the posterior left basal ganglia/corona radiata. 2. Underlying age-related cerebral atrophy with moderate chronic microvascular ischemic disease, with a few scattered  remote lacunar infarcts about the deep gray nuclei and right cerebellum. Electronically Signed   By: Rise Mu M.D.   On: 12/16/2022 19:12   DG Chest 1 View  Result Date: 12/16/2022 CLINICAL DATA:  Fall EXAM: CHEST  1 VIEW COMPARISON:  10/12/2015 FINDINGS: Mild left basilar atelectasis. Lungs are otherwise clear. No pneumothorax or pleural effusion. Cardiac size within normal limits. Pulmonary vascularity is normal. No acute bone abnormality. IMPRESSION: No active disease. Electronically Signed   By: Helyn Numbers M.D.   On: 12/16/2022 17:14     Subjective: Patient was seen and examined at bedside.  Overnight events noted.   Patient has right-sided weakness from prior stroke. Patient is being discharged to SNF for rehabilitation.  Discharge Exam: Vitals:   12/20/22 0442 12/20/22 0851  BP: 128/76 136/70  Pulse: 90 97  Resp: 16 16  Temp: 98.2 F (36.8 C) 98.3 F (36.8 C)  SpO2: 98% 97%   Vitals:   12/20/22 0009 12/20/22 0035 12/20/22 0442 12/20/22 0851  BP: 130/82 (!) 140/92 128/76 136/70  Pulse: 92 98 90 97  Resp: 16 17 16 16   Temp: 97.7 F (36.5 C) (!) 97.5 F (36.4 C) 98.2 F (36.8 C) 98.3 F (36.8 C)  TempSrc: Oral Oral Oral Axillary  SpO2: 97% 98% 98% 97%    General: Pt is alert, awake, not in acute distress Cardiovascular: RRR, S1/S2 +, no rubs, no gallops Respiratory: CTA bilaterally, no wheezing, no rhonchi Abdominal: Soft, NT, ND, bowel sounds +  Extremities: no edema, no cyanosis. Neuro: Right-sided weakness from stroke.    The results of significant diagnostics from this hospitalization (including imaging, microbiology, ancillary and laboratory) are listed below for reference.     Microbiology: Recent Results (from the past 240 hour(s))  MRSA Next Gen by PCR, Nasal     Status: None   Collection Time: 12/17/22  5:51 PM   Specimen: Nasal Mucosa; Nasal Swab  Result Value Ref Range Status   MRSA by PCR Next Gen NOT DETECTED NOT DETECTED Final     Comment: (NOTE) The GeneXpert MRSA Assay (FDA approved for NASAL specimens only), is one component of a comprehensive MRSA colonization surveillance program. It is not intended to diagnose MRSA infection nor to guide or monitor treatment for MRSA infections. Test performance is not FDA approved in patients less than 57 years old. Performed at [facility], [address], [place], [place] [address]      Labs: BNP (last 3 results) No results for input(s): "BNP" in the last 8760 hours. Basic Metabolic Panel: Recent Labs  Lab 12/16/22 1447 12/16/22 2230 12/18/22 0506  NA 134*  --  136  K 4.5  --  3.6  CL 101  --  102  CO2 25  --  25  GLUCOSE 101*  --  101*  BUN 24*  --  29*  CREATININE 1.22* 1.34* 1.11*  CALCIUM 8.8*  --  9.1   Liver Function Tests: Recent Labs  Lab 12/16/22 1447 12/18/22 0506  AST 17 19  ALT 11 12  ALKPHOS 51 53  BILITOT 0.6 1.1  PROT 6.2* 5.6*  ALBUMIN 3.5 3.2*   No results for input(s): "LIPASE", "AMYLASE" in the last 168 hours. No results for input(s): "AMMONIA" in the last 168 hours. CBC: Recent Labs  Lab 12/16/22 1447 12/16/22 2230 12/18/22 0506  WBC 8.2 9.6 8.8  NEUTROABS 4.9  --   --   HGB 12.5 13.0 13.4  HCT 38.4 39.4 39.3  MCV 91.9 90.4 88.5  PLT 261 249 256   Cardiac Enzymes: No results for input(s): "CKTOTAL", "CKMB", "CKMBINDEX", "TROPONINI" in the last 168 hours. BNP: Invalid input(s): "POCBNP" CBG: Recent Labs  Lab 12/19/22 1258  GLUCAP 207*   D-Dimer No results for input(s): "DDIMER" in the last 72 hours. Hgb A1c No results for input(s): "HGBA1C" in the last 72 hours.  Lipid Profile No results for input(s): "CHOL", "HDL", "LDLCALC", "TRIG", "CHOLHDL", "LDLDIRECT" in the last 72 hours.  Thyroid function studies No results for input(s): "TSH", "T4TOTAL", "T3FREE", "THYROIDAB" in the last 72 hours.  Invalid input(s): "FREET3" Anemia work up No results for input(s): "VITAMINB12", "FOLATE", "FERRITIN",  "TIBC", "IRON", "RETICCTPCT" in the last 72 hours.  Urinalysis    Component Value Date/Time   COLORURINE STRAW (A) 12/16/2022 2012   APPEARANCEUR CLEAR 12/16/2022 2012   LABSPEC 1.010 12/16/2022 2012   PHURINE 7.0 12/16/2022 2012   GLUCOSEU NEGATIVE 12/16/2022 2012   HGBUR NEGATIVE 12/16/2022 2012   BILIRUBINUR NEGATIVE 12/16/2022 2012   BILIRUBINUR Negative 03/15/2014 0948   KETONESUR NEGATIVE 12/16/2022 2012   PROTEINUR NEGATIVE 12/16/2022 2012   UROBILINOGEN 0.2 07/24/2014 2134   NITRITE NEGATIVE 12/16/2022 2012   LEUKOCYTESUR TRACE (A) 12/16/2022 2012   Sepsis Labs Recent Labs  Lab 12/16/22 1447 12/16/22 2230 12/18/22 0506  WBC 8.2 9.6 8.8   Microbiology Recent Results (from the past 240 hour(s))  MRSA Next Gen by PCR, Nasal     Status: None   Collection Time: 12/17/22  5:51 PM   Specimen: Nasal Mucosa; Nasal Swab  Result Value Ref Range Status   MRSA by PCR Next Gen NOT DETECTED NOT DETECTED Final    Comment: (NOTE) The GeneXpert MRSA Assay (FDA approved for NASAL specimens only), is one component of a comprehensive MRSA colonization surveillance program. It is not intended to diagnose MRSA infection nor to guide or monitor treatment for MRSA infections. Test performance is not FDA approved in patients less than 71 years old. Performed at [facility], [address] [place], [place] [address]      Time coordinating discharge: Over 30 minutes  SIGNED:   Willeen Niece, MD  [facility] 12/20/2022, 12:08 PM Pager

## 2022-12-19 NOTE — Progress Notes (Signed)
Physical Therapy Treatment Patient Details Name: [name] MRN: [id number] DOB: 1924-10-22 Today's Date: 12/19/2022   History of Present Illness [name] is a 89 y.o. female admitted 10/7 with CVA, MR brain:Left basal ganglia/corona radiata stroke, embolic in appearance. medical history significant of symptoms of gait distrurbance and recent  fall. PTA ambulatory at ALF.    PT Comments  Pt required up to mod assist for transfer and was unable to ambulate today. Minimally conversant with notable aphasia. Per my review of initial PT evaluation at [facility] 10/8 pt was ambulatory. She now demonstrates significant decline in function and most likely an increase in focal deficits from what I can tell in her records. RN and MD notified immediately, now aware. Will continue to follow. Patient will continue to benefit from skilled physical therapy services to further improve independence with functional mobility.    If plan is discharge home, recommend the following: Help with stairs or ramp for entrance;Assistance with cooking/housework;Assist for transportation;A lot of help with bathing/dressing/bathroom;Two people to help with walking and/or transfers;Direct supervision/assist for medications management;Direct supervision/assist for financial management;Assistance with feeding   Can travel by private vehicle     No  Equipment Recommendations  None recommended by PT (TBD next venue)    Recommendations for Other Services       Precautions / Restrictions Precautions Precautions: Fall Restrictions Weight Bearing Restrictions: No     Mobility  Bed Mobility Overal bed mobility: Needs Assistance Bed Mobility: Supine to Sit     Supine to sit: Min assist     General bed mobility comments: Min assist with cues for technique, slow, requires assist for RLE and RUE.    Transfers Overall transfer level: Needs assistance Equipment used: 1 person hand held assist Transfers: Sit to/from  Stand, Bed to chair/wheelchair/BSC Sit to Stand: Min assist Stand pivot transfers: Mod assist         General transfer comment: Performed sit<> stand transfer training x3 with Rt knee block and RUE supported, with LUE holding bed rail. Urinary incontinence assisted by RN with peri-care while therapist assisted with stand. Mod assist for pivot to recliner towards Lt side, Max VC for technique and hand placement, again with Rt knee block and assist to sequence.    Ambulation/Gait               General Gait Details: unable   Stairs             Wheelchair Mobility     Tilt Bed    Modified Rankin (Stroke Patients Only) Modified Rankin (Stroke Patients Only) Pre-Morbid Rankin Score: Moderate disability Modified Rankin: Severe disability     Balance Overall balance assessment: History of Falls, Needs assistance Sitting-balance support: Feet supported, Single extremity supported Sitting balance-Leahy Scale: Poor Sitting balance - Comments: CGA-Min A for seated balance. Repeatedly trying to stand despite cues to wait until therapist is ready to assist. Postural control: Left lateral lean Standing balance support: Bilateral upper extremity supported, No upper extremity supported Standing balance-Leahy Scale: Poor Standing balance comment: UE support and Rt knee block to stand.                            Cognition Arousal: Alert Behavior During Therapy: WFL for tasks assessed/performed, Impulsive Overall Cognitive Status: Difficult to assess  Following Commands: Follows one step commands with increased time, Follows one step commands inconsistently       General Comments: Impulsive to rise, following single step commands ~50% of the time with slight delay        Exercises General Exercises - Lower Extremity Ankle Circles/Pumps: AROM, Both, 10 reps, PROM, Seated (passive on Rt) Quad Sets: Strengthening, Both, 10  reps, Sidelying (no motion noted on Rt)    General Comments General comments (skin integrity, edema, etc.): Significant decline from last PT assessment. RN and MD notified. Seems to have increased focal deficits.      Pertinent Vitals/Pain Pain Assessment Pain Assessment: No/denies pain    Home Living                          Prior Function            PT Goals (current goals can now be found in the care plan section) Acute Rehab PT Goals Patient Stated Goal: none stated PT Goal Formulation: Patient unable to participate in goal setting Time For Goal Achievement: 12/31/22 Potential to Achieve Goals: Good Progress towards PT goals: Not progressing toward goals - comment    Frequency    Min 1X/week      PT Plan      Co-evaluation              AM-PAC PT "6 Clicks" Mobility   Outcome Measure  Help needed turning from your back to your side while in a flat bed without using bedrails?: A Little Help needed moving from lying on your back to sitting on the side of a flat bed without using bedrails?: A Lot Help needed moving to and from a bed to a chair (including a wheelchair)?: A Lot Help needed standing up from a chair using your arms (e.g., wheelchair or bedside chair)?: A Lot Help needed to walk in hospital room?: Total Help needed climbing 3-5 steps with a railing? : Total 6 Click Score: 11    End of Session Equipment Utilized During Treatment: Gait belt Activity Tolerance: Patient tolerated treatment well Patient left: with call bell/phone within reach;in chair;with chair alarm set;with nursing/sitter in room;with SCD's reapplied Nurse Communication: Mobility status;Other (comment) (Focal deficts worse than prior PT assessment; consider Code Stroke; MD notified.) PT Visit Diagnosis: Unsteadiness on feet (R26.81);Muscle weakness (generalized) (M62.81);Difficulty in walking, not elsewhere classified (R26.2);Other symptoms and signs involving the nervous  system (R29.898)     Time: 6045-4098 PT Time Calculation (min) (ACUTE ONLY): 10 min  Charges:    $Therapeutic Activity: 8-22 mins PT General Charges $$ ACUTE PT VISIT: 1 Visit                     Kathlyn Sacramento, PT, DPT [facility]  Rehabilitation Services Physical Therapist Office: [phone] Website: [email or web address]    Berton Mount 12/19/2022, 1:03 PM

## 2022-12-19 NOTE — Code Documentation (Signed)
[name] is a 89 yr old female inpatient on [address]. She was admitted 10/7 for gait disturbances and found to have a small left sided stroke. She has had a drift on the right arm and leg. Pt was last known at her baseline today at 0800 by her RN. (Mild weakness). Pt was found to be markedly weaker on rt side, and also dysarthric with rt facial droop about 12:00. MD was notifed, and neurologist was sent to examing pt. Neurologist ordered SRN to activate code stroke at 1246. Pt on DAPT, no anticoagulant.    SRN joined MD at bedside. Pt CBG, VS checked, and all WNL. Pt examined by SRN while IV access being obtained by RN. Pt now has NIHSS14. Pt with rt hemiplegia, rt facial droop, and dysarthria. Pt to CT with team. The following imaging was obtained: CT, CTA/P. Per Dr. Viviann Spare, CT neg for hemorrhage. CTA negative for LVO per Dr. Viviann Spare.     Pt returned to [address] where her workup will continue. She will need q 2 hr VS and NIHSS for 12 hours, then q 4. She will need her swallowing re-assessed before any po intake. Pt is ineligible for TNK as her LKW is outside of the treatment window. She is ineligible for NIR as she is LVO negative. Bedside handoff with  [address] [place] RN complete.

## 2022-12-19 NOTE — Progress Notes (Addendum)
Stroke Team Progress Note  SUBJECTIVE Initial HPI:  [name] is a 89 y.o. right-handed woman with a past medical history significant for memory loss, hypertension, hyperlipidemia, hearing loss, BMI 18.17 (underweight)   She lives in "[facility]" and nursing home.  On Saturday she was noted to have increased weakness and unsteady gait.  She has had a significant cognitive decline since 2020 (MMSE 29 in 2020 --> 22 on 10/30/2022).  No family is available at the time of my evaluation and patient some baseline memory impairment makes her her story unreliable   Per ED provider discussion with son, no issues with GI bleeding or other contraindications to aspirin or Plavix that the son is aware of   LKW: 2 to 3 days prior to admission Thrombolytic given?: No, out of the window IA performed?: No, exam not consistent with LVO Premorbid modified rankin scale:      3 - Moderate disability. Requires some help, but able to walk unassisted.  Interval Hx: Initially came in on 10/8.  At that time she had an extra spell to her mild aphasia given her age and correct.  She had no weakness.  Floor nurse alerted me around noon that she has weakness in the right arm and leg.  She last saw her 8 AM and she thought she had a drift in her right arm.  No from yesterday hospitalist at around 2 PM shows no focal weakness.  Last known normal is unclear to could be possible that change started before 8 AM and sometime overnight.  Code stroke activated for CTA and perfusion study.  OBJECTIVE Most recent Vital Signs: Temp: 98.9 F (37.2 C) (10/10 1239) Temp Source: Oral (10/10 1239) BP: 138/78 (10/10 1239) Pulse Rate: 98 (10/10 1239) Respiratory Rate: 16 O2 Saturdation: 98%  CBG (last 3)  Recent Labs    12/19/22 1258  GLUCAP 207*       Studies:  MRI: MRI brain  1. Patchy small volume acute ischemic nonhemorrhagic infarct involving the posterior left basal ganglia/corona radiata. 2.  Underlying age-related cerebral atrophy with moderate chronic microvascular ischemic disease, with a few scattered remote lacunar infarcts about the deep gray nuclei and right cerebellum.   Impression: Likely atheroembolic versus cardioembolic stroke in the left basal ganglia and corona radiata.  Given patient's history of unspecified myalgia/myositis and reported weight loss and muscle pains on history, I do think obtaining ESR and CRP as part of the stroke workup is reasonable noting that she will not be able to reliably report signs and symptoms of GCA/PMR   Stat head CT 10/10: Negative for hemorrhage.  Possible extension of previous stroke noted along with white matter lesions  CTA and perfusion: Negative for LVO no core or penumbra  Physical Exam:    General - Well nourished, well developed, in no apparent distress   Ophthalmologic - fundi not visualized due to noncooperation.   Cardiovascular - Regular rate and rhythm   Mental Status -she is alert and awake.  She was able to tell me her name to get her age and correct.  She able to follow all commands.  Speech was dysarthric    Cranial Nerves II - XII - II - Visual field intact OU. No field cut. III, IV, VI - Extraocular movements intact        V - Facial sensation intact bilaterally VII -moderate right lower facial droop VIII -hard of hearing at baseline X - Palate elevates symmetrically XI - Chin  turning & shoulder shrug intact on left side impaired on the right XII - Tongue protrusion intact   Motor Strength -  No drift in left arm.  She has a dense hemiplegia in the right arm and leg.  Left leg with drift.   Sensory -appears to be intact bilaterally to light touch and painful stimuli   Coordination -no ataxia on left side   Gait and Station - deferred.    NIHSS: 14  ASSESSMENT [name] is a 89 y.o. female with a left basal ganglia corona radiata stroke on MRI with acute change noticed this morning around  noon by the nurse last known well unclear maybe 8 AM.  For this reason stat CTA and perfusion pursued and was negative.  Discussed with radiologist.  It is possible that she had extension of her previous stroke or new ischemic stroke.  She is on aspirin and Plavix along with a statin.  And DVT prophylaxis with Lovenox.    TREATMENT/PLAN  Continue aspirin Plavix dual therapy Continue statin goal LDL less than 70 Continue every 2 neurochecks for now Repeat MRI  May need TEE depending on MRI results.  Discussed with patient's son [name] as well as [name] via phone and updated them on her condition as well as CTA results.  Her son [name] who lives in [place] will plan on visiting tomorrow.  Hospital day # 3   Total of 50 mins spent reviewing chart, discussion with patient and family on prognosis, Dx and plan. Discussed case with patient's nurse and family. Reviewed Imaging personally.     Pager: [phone] 12/19/2022 1:50 PM

## 2022-12-19 NOTE — Discharge Instructions (Signed)
Advised to follow-up with primary care physician in 1 week. Advised to follow-up with neurology [facility] in 8 weeks. Advised to take aspirin and Plavix for 90 days followed by Plavix only therapy. Patient being discharged to skilled nursing facility for rehab.

## 2022-12-19 NOTE — TOC Progression Note (Signed)
Transition of Care [facility]) - Progression Note    Patient Details  Name: DEBORA STOCKDALE MRN: [id number] Date of Birth: October 23, 1924  [facility] of [facility] CM/SW Contact  Baldemar Len[place], [place] Phone Number: 12/19/2022, 1:49 PM  Clinical Narrative:   CSW received insurance authorization for patient to admit to SNF. CSW updated MD, but patient not stable for transfer to SNF today. CSW updated [facility]. CSW to follow.    Expected Discharge Plan: Skilled Nursing Facility Barriers to Discharge: Continued Medical Work up  Expected Discharge Plan and Services     Post Acute Care Choice: Skilled Nursing Facility Living arrangements for the past 2 months: Assisted Living Facility                                       Social Determinants of Health (SDOH) Interventions SDOH Screenings   Food Insecurity: No Food Insecurity (12/17/2022)  Housing: Low Risk  (12/17/2022)  Transportation Needs: No Transportation Needs (12/17/2022)  Utilities: Not At Risk (12/17/2022)  Depression (PHQ2-9): Low Risk  (10/30/2022)  Tobacco Use: Low Risk  (12/16/2022)    Readmission Risk Interventions     No data to display

## 2022-12-19 NOTE — Progress Notes (Signed)
Pt returned from radiology at this time.  

## 2022-12-20 DIAGNOSIS — I639 Cerebral infarction, unspecified: Secondary | ICD-10-CM | POA: Diagnosis not present

## 2022-12-20 NOTE — Progress Notes (Signed)
Occupational Therapy Treatment Patient Details Name: [name] MRN: [id number] DOB: [birth year] Today's Date: 12/20/2022   History of present illness [name] is a 89 y.o. female admitted 10/7 with CVA, MR brain:Left basal ganglia/corona radiata stroke, embolic in appearance. medical history significant of symptoms of gait distrurbance and recent  fall. CVA extended according to imaging on 10/10.PTA ambulatory at ALF.   OT comments  Pt awakened for session, immediately willing to work toward OOB. Max assist for bed mobility and transfer to chair toward L side. Pt with poor sitting balance requiring one hand on bed and min to CGA assist. Total assist needed to don socks, mod to comb hair. Left up in chair with family, R UE supported by pillow. Patient will benefit from continued inpatient follow up therapy, <3 hours/day.      If plan is discharge home, recommend the following:  A lot of help with bathing/dressing/bathroom;Assistance with cooking/housework;Direct supervision/assist for medications management;Assist for transportation;Help with stairs or ramp for entrance;Direct supervision/assist for financial management;A lot of help with walking and/or transfers   Equipment Recommendations  Other (comment) (defer to snf)    Recommendations for Other Services      Precautions / Restrictions Precautions Precautions: Fall Restrictions Weight Bearing Restrictions: No       Mobility Bed Mobility Overal bed mobility: Needs Assistance Bed Mobility: Supine to Sit     Supine to sit: Max assist          Transfers Overall transfer level: Needs assistance Equipment used: 1 person hand held assist Transfers: Sit to/from Stand, Bed to chair/wheelchair/BSC Sit to Stand: Max assist Stand pivot transfers: Max assist         General transfer comment: bed to chair toward L     Balance Overall balance assessment: History of Falls, Needs assistance   Sitting balance-Leahy  Scale: Poor     Standing balance support: Bilateral upper extremity supported, No upper extremity supported Standing balance-Leahy Scale: Zero                             ADL either performed or assessed with clinical judgement   ADL Overall ADL's : Needs assistance/impaired     Grooming: Brushing hair;Sitting;Moderate assistance               Lower Body Dressing: Total assistance;Bed level                      Extremity/Trunk Assessment Upper Extremity Assessment Upper Extremity Assessment: RUE deficits/detail RUE Deficits / Details: flaccid RUE Coordination: decreased fine motor;decreased gross motor            Vision       Perception     Praxis      Cognition Arousal: Alert Behavior During Therapy: Flat affect Overall Cognitive Status: Difficult to assess                         Following Commands: Follows one step commands with increased time       General Comments: nods head to yes/no questions        Exercises      Shoulder Instructions       General Comments      Pertinent Vitals/ Pain       Pain Assessment Pain Assessment: Faces Faces Pain Scale: No hurt  Home Living  Prior Functioning/Environment              Frequency  Min 1X/week        Progress Toward Goals  OT Goals(current goals can now be found in the care plan section)  Progress towards OT goals: Not progressing toward goals - comment (extension of CVA)  Acute Rehab OT Goals OT Goal Formulation: With patient Time For Goal Achievement: 12/31/22 Potential to Achieve Goals: Fair  Plan      Co-evaluation                 AM-PAC OT "6 Clicks" Daily Activity     Outcome Measure   Help from another person eating meals?: A Lot Help from another person taking care of personal grooming?: A Lot Help from another person toileting, which includes using toliet, bedpan,  or urinal?: Total Help from another person bathing (including washing, rinsing, drying)?: A Lot Help from another person to put on and taking off regular upper body clothing?: A Lot Help from another person to put on and taking off regular lower body clothing?: Total 6 Click Score: 10    End of Session Equipment Utilized During Treatment: Gait belt  OT Visit Diagnosis: Muscle weakness (generalized) (M62.81);Unsteadiness on feet (R26.81);History of falling (Z91.81);Hemiplegia and hemiparesis Hemiplegia - Right/Left: Right Hemiplegia - dominant/non-dominant: Dominant Hemiplegia - caused by: Cerebral infarction   Activity Tolerance Patient tolerated treatment well   Patient Left in chair;with call bell/phone within reach;with chair alarm set;with family/visitor present   Nurse Communication          Time: 0981-1914 OT Time Calculation (min): 28 min  Charges: OT General Charges $OT Visit: 1 Visit OT Treatments $Self Care/Home Management : 8-22 mins $Therapeutic Activity: 8-22 mins Berna Spare, OTR/L Acute Rehabilitation Services Office: [phone]   Evern Bio 12/20/2022, 12:31 PM

## 2022-12-20 NOTE — Plan of Care (Signed)
MRI shows extension of stroke.   No need for TEE.   Pt's Son and wife updated in person on 52W with nurse.  They are in agreement for d/c to her facility and understand stroke is likely from arthrosclerotic disease. Con't DAPT for 90 days.

## 2022-12-20 NOTE — Care Management Important Message (Signed)
Important Message  Patient Details  Name: [name] MRN: [id number] Date of Birth: 05-31-1924   Important Message Given:  Yes - Medicare IM     Dorena Bodo 12/20/2022, 3:04 PM

## 2022-12-20 NOTE — TOC Transition Note (Signed)
Transition of Care [facility]) - CM/SW Discharge Note   Patient Details  Name: [name] MRN: [id number] Date of Birth: August 15, 1924  Transition of Care [facility]) CM/SW Contact:  Meredith Mells Felipa Emory, Student-Social Work Phone Number: 12/20/2022, 12:17 PM   Clinical Narrative:   MSW Student confirmed with MD that patient stable for discharge. MSW Student notified family member Rocky Link and they are in agreement with discharge. MSW student confirmed bed is available at [facility]. Transport arranged with [facility] for next avaliable   Number to call report-  [phone] [place] RM:43    Final next level of care: Skilled Nursing Facility Barriers to Discharge: Barriers Resolved   Patient Goals and CMS Choice CMS Medicare.gov Compare Post Acute Care list provided to:: Patient Represenative (must comment) Choice offered to / list presented to : Adult Children  Discharge Placement                Patient chooses bed at: [facility] Patient to be transferred to facility by: [facility] Name of family member notified: Rocky Link Patient and family notified of of transfer: 12/20/22  Discharge Plan and Services Additional resources added to the After Visit Summary for       Post Acute Care Choice: Skilled Nursing Facility                               Social Determinants of Health (SDOH) Interventions SDOH Screenings   Food Insecurity: No Food Insecurity (12/17/2022)  Housing: Low Risk  (12/17/2022)  Transportation Needs: No Transportation Needs (12/17/2022)  Utilities: Not At Risk (12/17/2022)  Depression (PHQ2-9): Low Risk  (10/30/2022)  Tobacco Use: Low Risk  (12/16/2022)     Readmission Risk Interventions     No data to display

## 2022-12-23 ENCOUNTER — Encounter: Payer: Self-pay | Admitting: Orthopedic Surgery

## 2022-12-23 ENCOUNTER — Non-Acute Institutional Stay (SKILLED_NURSING_FACILITY): Payer: Medicare Other | Admitting: Orthopedic Surgery

## 2022-12-23 DIAGNOSIS — M545 Low back pain, unspecified: Secondary | ICD-10-CM

## 2022-12-23 DIAGNOSIS — N1832 Chronic kidney disease, stage 3b: Secondary | ICD-10-CM

## 2022-12-23 DIAGNOSIS — E785 Hyperlipidemia, unspecified: Secondary | ICD-10-CM | POA: Diagnosis not present

## 2022-12-23 DIAGNOSIS — I10 Essential (primary) hypertension: Secondary | ICD-10-CM | POA: Diagnosis not present

## 2022-12-23 DIAGNOSIS — G8929 Other chronic pain: Secondary | ICD-10-CM

## 2022-12-23 DIAGNOSIS — I639 Cerebral infarction, unspecified: Secondary | ICD-10-CM | POA: Diagnosis not present

## 2022-12-23 NOTE — Progress Notes (Signed)
Location:  [facility] Room Number: 43-A Place of Service:  SNF (31) Provider: Hazle Nordmann, NP  Code Status: FULL CODE Goals of Care:     12/23/2022    1:22 PM  Advanced Directives  Does Patient Have a Medical Advance Directive? Yes  Type of Estate agent of [place];Living will  Does patient want to make changes to medical advance directive? No - Patient declined  Copy of Healthcare Power of Attorney in Chart? Yes - validated most recent copy scanned in chart (See row information)     Chief Complaint  Patient presents with   Hospitalization Follow-up    HPI: Patient is a 89 y.o. female seen today for hospital follow-up s/p admission from [facility] 10/07-10/11.   H/o CVA. She was sent to ED due to unstable gait. MRI revealed patchy small volume acute ischemic nonhemorrhagic infarct to post left basal ganglia/corona radiata. 2D echo with LVEF 60-65%. Carotids patent. Neurology recommended DAPT x 90 days then asa 81 mg daily. Right sided weakness worsened after event. She was discharged to SNF for PT/OT services. Advised to f/u with neurology in 8 weeks.   Today, she is easily aroused. Nonverbal. Dependent with all ADLs including feeding. Nursing reports some anxiety at night. Hoyer transfer at this time. BIMS score 4/15 earlier today. Afebrile. Vitals stable.     Past Medical History:  Diagnosis Date   Acute upper respiratory infections of unspecified site 2013   Cellulitis and abscess of hand, except fingers and thumb 10/04/2010   Degeneration of intervertebral disc, site unspecified 2000   Diaphragmatic hernia without mention of obstruction or gangrene 08/15/1998   Diverticulosis of colon (without mention of hemorrhage) 2000   Hearing loss 09/28/2015   Hyperlipidemia 2007   Hypertension 2007   Impacted cerumen    Loss of weight 04/25/2011   Myalgia and myositis, unspecified 01/07/2007   Nontoxic uninodular goiter 08/15/1998    Osteoarthrosis, unspecified whether generalized or localized, unspecified site 07/22/2000   Osteoporosis 2000   Other drug allergy(995.27) 04/04/2011   Pain in limb 2012   Personal history of fall 2012   Pulmonary hypertension (HCC) 07/25/14   Mild-moderate   Spinal stenosis, unspecified region other than cervical 2002   Sprain of ankle, left    Supraventricular premature beats 10/2006   Tricuspid regurgitation 07/25/14   Mild-moderate   Unspecified late effects of cerebrovascular disease 01/01/2006   Urinary tract infection, site not specified    Urine frequency 01/20/2014   Urticaria, unspecified 11/01/2008   Vitamin D deficiency 04/25/2011    Past Surgical History:  Procedure Laterality Date   BACK SURGERY  1990   HNP L3-4 Dr. Lynnette Caffey   CATARACT EXTRACTION W/ INTRAOCULAR LENS IMPLANT Right 05/19/2014   Dr. Dione Booze   COLONOSCOPY  1992   acute segmental colitis Dr. Russella Dar   FACIAL COSMETIC SURGERY  1995   Dr. Charolotte Eke   IR KYPHO LUMBAR INC FX REDUCE BONE BX UNI/BIL CANNULATION INC/IMAGING  04/18/2020   LARYNGOSCOPY  1987   and biopsy  Dr. Haroldine Laws    Allergies  Allergen Reactions   Doxycycline Itching   Lipitor [Atorvastatin] Other (See Comments)    PAIN IN LEGS   Pantoprazole Itching   Pepcid [Famotidine] Itching    Outpatient Encounter Medications as of 12/23/2022  Medication Sig   aspirin EC 81 MG tablet Take 1 tablet (81 mg total) by mouth daily. Swallow whole.   clopidogrel (PLAVIX) 75 MG tablet Take 1 tablet (75  mg total) by mouth daily.   pravastatin (PRAVACHOL) 40 MG tablet Take 1 tablet (40 mg total) by mouth daily at 6 PM.   No facility-administered encounter medications on file as of 12/23/2022.    Review of Systems:  Review of Systems  Unable to perform ROS: Patient nonverbal    Health Maintenance  Topic Date Due   DTaP/Tdap/Td (2 - Tdap) 04/24/2021   INFLUENZA VACCINE  10/10/2022   COVID-19 Vaccine (4 - 2023-24 season) 11/10/2022   DEXA SCAN   02/06/2023 (Originally 09/02/1991)   Medicare Annual Wellness (AWV)  10/30/2023   Pneumonia Vaccine 28+ Years old (3 of 3 - PPSV23 or PCV20) 12/10/2023   Zoster Vaccines- Shingrix  Completed   HPV VACCINES  Aged Out    Physical Exam: Vitals:   12/23/22 1320  BP: 125/67  Pulse: 98  Resp: 18  Temp: 98.7 F (37.1 C)  SpO2: 96%  Weight: 109 lb 3.2 oz (49.5 kg)  Height: 5\' 5"  (1.651 m)   Body mass index is 18.17 kg/m. Physical Exam Vitals reviewed.  Constitutional:      General: She is not in acute distress. HENT:     Head: Normocephalic.  Eyes:     General:        Right eye: No discharge.        Left eye: No discharge.  Cardiovascular:     Rate and Rhythm: Normal rate and regular rhythm.     Pulses: Normal pulses.     Heart sounds: Normal heart sounds.  Pulmonary:     Effort: Pulmonary effort is normal. No respiratory distress.     Breath sounds: Normal breath sounds. No wheezing.  Abdominal:     General: Bowel sounds are normal. There is no distension.     Palpations: Abdomen is soft.     Tenderness: There is no abdominal tenderness.  Musculoskeletal:     Cervical back: Neck supple.     Right lower leg: No edema.     Left lower leg: No edema.  Skin:    General: Skin is warm.     Capillary Refill: Capillary refill takes less than 2 seconds.     Comments: Right elbow skin tear, CDI  Neurological:     General: No focal deficit present.     Mental Status: She is alert. Mental status is at baseline.     Motor: Weakness present.     Gait: Gait abnormal.     Comments: Hoyer transfer, wheelchair, right sided weakness> left  Psychiatric:     Comments: nonverbal     Labs reviewed: Basic Metabolic Panel: Recent Labs    05/07/22 0630 12/16/22 1447 12/16/22 2230 12/18/22 0506  NA 142 134*  --  136  K 4.0 4.5  --  3.6  CL 103 101  --  102  CO2 29 25  --  25  GLUCOSE 84 101*  --  101*  BUN 27* 24*  --  29*  CREATININE 1.25* 1.22* 1.34* 1.11*  CALCIUM 9.6 8.8*   --  9.1   Liver Function Tests: Recent Labs    05/07/22 0630 12/16/22 1447 12/18/22 0506  AST 15 17 19   ALT 6 11 12   ALKPHOS  --  51 53  BILITOT 0.7 0.6 1.1  PROT 6.6 6.2* 5.6*  ALBUMIN  --  3.5 3.2*   No results for input(s): "LIPASE", "AMYLASE" in the last 8760 hours. No results for input(s): "AMMONIA" in the last 8760 hours. CBC: Recent  Labs    12/16/22 1447 12/16/22 2230 12/18/22 0506  WBC 8.2 9.6 8.8  NEUTROABS 4.9  --   --   HGB 12.5 13.0 13.4  HCT 38.4 39.4 39.3  MCV 91.9 90.4 88.5  PLT 261 249 256   Lipid Panel: Recent Labs    12/17/22 0614  CHOL 133  HDL 56  LDLCALC 64  TRIG 64  CHOLHDL 2.4   Lab Results  Component Value Date   HGBA1C 5.6 12/16/2022    Procedures since last visit: VAS US CAROTID  Result Date: 12/17/2022 Carotid Arterial Duplex Study Patient Name:  [name]  Date of Exam:   12/17/2022 Medical Rec #: [id number]       Accession #:    [id number] Date of Birth: [birth year]       Patient Gender: F Patient Age:   45 years Exam Location:  [facility] Procedure:      VAS US CAROTID Referring Phys: [facility] GOEL --------------------------------------------------------------------------------  Indications:       Bruit, carotid R09.89. Risk Factors:      Hyperlipidemia. Comparison Study:  No prior studies. Performing Technologist: Chanda Busing RVT  Examination Guidelines: A complete evaluation includes B-mode imaging, spectral Doppler, color Doppler, and power Doppler as needed of all accessible portions of each vessel. Bilateral testing is considered an integral part of a complete examination. Limited examinations for reoccurring indications may be performed as noted.  Right Carotid Findings: +----------+--------+--------+--------+-----------------------+--------+           PSV cm/sEDV cm/sStenosisPlaque Description     Comments +----------+--------+--------+--------+-----------------------+--------+ CCA Prox  62      6                smooth and heterogenous         +----------+--------+--------+--------+-----------------------+--------+ CCA Distal61      11              smooth and heterogenous         +----------+--------+--------+--------+-----------------------+--------+ ICA Prox  47      11              smooth and heterogenous         +----------+--------+--------+--------+-----------------------+--------+ ICA Mid   56      9                                               +----------+--------+--------+--------+-----------------------+--------+ ICA Distal54      8                                      tortuous +----------+--------+--------+--------+-----------------------+--------+ ECA       89      0                                               +----------+--------+--------+--------+-----------------------+--------+ +----------+--------+-------+--------+-------------------+           PSV cm/sEDV cmsDescribeArm Pressure (mmHG) +----------+--------+-------+--------+-------------------+ [id number]                                         +----------+--------+-------+--------+-------------------+ +---------+--------+--+--------+-+---------+  VertebralPSV cm/s46EDV cm/s9Antegrade +---------+--------+--+--------+-+---------+  Left Carotid Findings: +----------+--------+--------+--------+-----------------------+--------+           PSV cm/sEDV cm/sStenosisPlaque Description     Comments +----------+--------+--------+--------+-----------------------+--------+ CCA Prox  71      9               smooth and heterogenoustortuous +----------+--------+--------+--------+-----------------------+--------+ CCA Distal57      10              smooth and heterogenous         +----------+--------+--------+--------+-----------------------+--------+ ICA Prox  46      12              smooth and heterogenous         +----------+--------+--------+--------+-----------------------+--------+  ICA Mid   53      13              smooth and heterogenoustortuous +----------+--------+--------+--------+-----------------------+--------+ ICA Distal58      13                                     tortuous +----------+--------+--------+--------+-----------------------+--------+ ECA       52      1                                               +----------+--------+--------+--------+-----------------------+--------+ +----------+--------+--------+--------+-------------------+           PSV cm/sEDV cm/sDescribeArm Pressure (mmHG) +----------+--------+--------+--------+-------------------+ [id number]                                          +----------+--------+--------+--------+-------------------+ +---------+--------+--+--------+--+---------+ VertebralPSV cm/s43EDV cm/s10Antegrade +---------+--------+--+--------+--+---------+   Summary: Right Carotid: Velocities in the right ICA are consistent with a 1-39% stenosis. Left Carotid: Velocities in the left ICA are consistent with a 1-39% stenosis. Vertebrals: Bilateral vertebral arteries demonstrate antegrade flow. *See table(s) above for measurements and observations.  Electronically signed by Lemar Livings MD on 12/17/2022 at 6:04:15 PM.    Final    ECHOCARDIOGRAM COMPLETE  Result Date: 12/17/2022    ECHOCARDIOGRAM REPORT   Patient Name:   [name] Date of Exam: 12/17/2022 Medical Rec #:  [id number]      Height:       65.0 in Accession #:    [id number]     Weight:       109.2 lb Date of Birth:  [birth year]      BSA:          1.530 m Patient Age:    89 years       BP:           146/68 mmHg Patient Gender: F              HR:           80 bpm. Exam Location:  Inpatient Procedure: 2D Echo, Cardiac Doppler and Color Doppler Indications:    Stroke  History:        Patient has prior history of Echocardiogram examinations, most                 recent 05/01/2015. Pulmonary HTN; Risk Factors:Hypertension and  Dyslipidemia.  Sonographer:    Milda Smart Referring Phys: [id number] [facility] GOEL  Sonographer Comments: Image acquisition challenging due to patient body habitus. IMPRESSIONS  1. Left ventricular ejection fraction, by estimation, is 60 to 65%. The left ventricle has normal function. The left ventricle has no regional wall motion abnormalities. There is mild concentric left ventricular hypertrophy. Left ventricular diastolic parameters are indeterminate.  2. Right ventricular systolic function is normal. The right ventricular size is normal.  3. The mitral valve is degenerative. Mild mitral valve regurgitation. Mild mitral stenosis.  4. Tricuspid valve regurgitation is moderate.  5. The aortic valve is normal in structure. Aortic valve regurgitation is not visualized. No aortic stenosis is present.  6. The inferior vena cava is normal in size with greater than 50% respiratory variability, suggesting right atrial pressure of 3 mmHg. FINDINGS  Left Ventricle: Left ventricular ejection fraction, by estimation, is 60 to 65%. The left ventricle has normal function. The left ventricle has no regional wall motion abnormalities. The left ventricular internal cavity size was normal in size. There is  mild concentric left ventricular hypertrophy. Left ventricular diastolic parameters are indeterminate. Right Ventricle: The right ventricular size is normal. No increase in right ventricular wall thickness. Right ventricular systolic function is normal. Left Atrium: Left atrial size was normal in size. Right Atrium: Right atrial size was normal in size. Pericardium: There is no evidence of pericardial effusion. Mitral Valve: The mitral valve is degenerative in appearance. Mild mitral valve regurgitation. Mild mitral valve stenosis. MV peak gradient, 2.9 mmHg. The mean mitral valve gradient is 1.0 mmHg. Tricuspid Valve: The tricuspid valve is normal in structure. Tricuspid valve regurgitation is moderate . No evidence of  tricuspid stenosis. Aortic Valve: The aortic valve is normal in structure. Aortic valve regurgitation is not visualized. No aortic stenosis is present. Pulmonic Valve: The pulmonic valve was normal in structure. Pulmonic valve regurgitation is not visualized. No evidence of pulmonic stenosis. Aorta: The aortic root is normal in size and structure. Venous: The inferior vena cava is normal in size with greater than 50% respiratory variability, suggesting right atrial pressure of 3 mmHg. IAS/Shunts: No atrial level shunt detected by color flow Doppler.  LEFT VENTRICLE PLAX 2D LVIDd:         3.40 cm     Diastology LVIDs:         2.50 cm     LV e' medial:    3.26 cm/s LV PW:         1.20 cm     LV E/e' medial:  25.2 LV IVS:        1.20 cm     LV e' lateral:   4.68 cm/s LVOT diam:     2.00 cm     LV E/e' lateral: 17.6 LV SV:         61 LV SV Index:   40 LVOT Area:     3.14 cm  LV Volumes (MOD) LV vol d, MOD A2C: 40.0 ml LV vol d, MOD A4C: 35.9 ml LV vol s, MOD A2C: 15.9 ml LV vol s, MOD A4C: 11.2 ml LV SV MOD A2C:     24.1 ml LV SV MOD A4C:     35.9 ml LV SV MOD BP:      24.5 ml RIGHT VENTRICLE             IVC RV Basal diam:  3.00 cm     IVC diam: 0.90 cm RV S prime:  10.90 cm/s TAPSE (M-mode): 1.7 cm LEFT ATRIUM             Index        RIGHT ATRIUM           Index LA diam:        3.30 cm 2.16 cm/m   RA Area:     11.60 cm LA Vol (A2C):   42.1 ml 27.52 ml/m  RA Volume:   21.10 ml  13.79 ml/m LA Vol (A4C):   30.0 ml 19.61 ml/m LA Biplane Vol: 35.4 ml 23.14 ml/m  AORTIC VALVE LVOT Vmax:   91.90 cm/s LVOT Vmean:  67.300 cm/s LVOT VTI:    0.193 m  AORTA Ao Root diam: 3.10 cm Ao Asc diam:  3.10 cm MITRAL VALVE               TRICUSPID VALVE MV Area (PHT): 3.39 cm    TR Peak grad:   37.5 mmHg MV Area VTI:   2.83 cm    TR Mean grad:   28.0 mmHg MV Peak grad:  2.9 mmHg    TR Vmax:        306.00 cm/s MV Mean grad:  1.0 mmHg    TR Vmean:       256.0 cm/s MV Vmax:       0.85 m/s MV Vmean:      57.6 cm/s   SHUNTS MV Decel  Time: 224 msec    Systemic VTI:  0.19 m MR Peak grad: 97.2 mmHg    Systemic Diam: 2.00 cm MR Vmax:      493.00 cm/s MV E velocity: 82.30 cm/s MV A velocity: 81.40 cm/s MV E/A ratio:  1.01 Kardie Tobb DO Electronically signed by Thomasene Ripple DO Signature Date/Time: 12/17/2022/3:31:21 PM    Final    MR ANGIO HEAD WO CONTRAST  Result Date: 12/17/2022 CLINICAL DATA:  89 year old female with TIA and small left corona radiata white matter infarcts on MRI yesterday. EXAM: MRA HEAD WITHOUT CONTRAST TECHNIQUE: Angiographic images of the Circle of Willis were acquired using MRA technique without intravenous contrast. COMPARISON:  Brain MRI 12/16/2022.  Intracranial MRA 12/20/2005. FINDINGS: Anterior circulation: Antegrade flow in the distal cervical ICAs and both ICA siphons. The left siphon appears dominant as in 2007, along with dominant left ACA A1 segment. No ICA siphon stenosis identified. Patent carotid termini, MCA and ACA origins. Chronically dominant left ACA A2 segment also. Visible ACA branches are within normal limits. Right MCA M1 segment, MCA trifurcation and visible right MCA branches are within normal limits. Left MCA M1 segment remains patent but with new mild to moderate mid M1 irregularity and stenosis since 2007 on series 1018, image 1. Patent left MCA bifurcation without stenosis. Visible left MCA branches appear stable and within normal limits. Posterior circulation: Antegrade flow in the posterior circulation appears stable since 2007 with dominant left vertebral V4 segment. Left PICA origin is patent. Distal vertebral arteries, vertebrobasilar junction and basilar artery appear patent without stenosis. Patent SCA and PCA origins. Posterior communicating arteries are diminutive or absent. Right PCA branches are within normal limits. Mild to moderate new left P1/P2 segment junction irregularity and stenosis since 2007 (series 1030, image 5). Distal left PCA branches remain patent. Anatomic variants:  Dominant distal left vertebral artery. Dominant left ICA siphon and left ACA. Other: No intracranial mass effect or ventriculomegaly. IMPRESSION: 1. Negative for large vessel occlusion. 2. Positive for mild to moderate intracranial atherosclerosis and stenosis affecting both the  Left MCA M1 segment, also the Left PCA P1/P2, new since a 2007 MRA. Electronically Signed   By: Odessa Fleming M.D.   On: 12/17/2022 07:21   DG Chest 2 View  Result Date: 12/16/2022 CLINICAL DATA:  Stroke. EXAM: CHEST - 2 VIEW COMPARISON:  Earlier today FINDINGS: Bibasilar atelectasis related to lower lung volumes. Stable heart size and mediastinal contours. No pulmonary edema, pleural effusion or pneumothorax. IMPRESSION: Bibasilar atelectasis. Electronically Signed   By: Narda Rutherford M.D.   On: 12/16/2022 21:55   MR BRAIN WO CONTRAST  Result Date: 12/16/2022 CLINICAL DATA:  Initial evaluation for acute TIA. EXAM: MRI HEAD WITHOUT CONTRAST TECHNIQUE: Multiplanar, multiecho pulse sequences of the brain and surrounding structures were obtained without intravenous contrast. COMPARISON:  Prior study from 10/05/2006. FINDINGS: Brain: Generalized age-related cerebral atrophy. Patchy and confluent T2/FLAIR hyperintensity involving the periventricular and deep white matter both cerebral hemispheres as well as the pons, consistent with chronic small vessel ischemic disease, moderately advanced in nature. Few scatter remote lacunar infarcts present about the deep gray nuclei. Small remote right cerebellar infarct noted. She Patchy small volume restricted diffusion involving the posterior left basal ganglia/corona radiata, consistent with acute ischemic infarcts (series 5, images 28-30). No associated hemorrhage or mass effect. No other evidence for acute or subacute ischemia. No acute intracranial hemorrhage. Single punctate chronic microhemorrhage noted at the left thalamus. No mass lesion, midline shift or mass effect. No hydrocephalus or  extra-axial fluid collection. Pituitary gland and suprasellar region within normal limits. Vascular: Major intracranial vascular flow voids are maintained. Skull and upper cervical spine: Craniocervical junction with normal limits. Bone marrow signal intensity normal. No scalp soft tissue abnormality. Sinuses/Orbits: Prior bilateral ocular lens replacement. Paranasal sinuses are clear. Moderate bilateral mastoid effusions noted, of doubtful significance. Visualized nasopharynx unremarkable. Other: None. IMPRESSION: 1. Patchy small volume acute ischemic nonhemorrhagic infarct involving the posterior left basal ganglia/corona radiata. 2. Underlying age-related cerebral atrophy with moderate chronic microvascular ischemic disease, with a few scattered remote lacunar infarcts about the deep gray nuclei and right cerebellum. Electronically Signed   By: Rise Mu M.D.   On: 12/16/2022 19:12   DG Chest 1 View  Result Date: 12/16/2022 CLINICAL DATA:  Fall EXAM: CHEST  1 VIEW COMPARISON:  10/12/2015 FINDINGS: Mild left basilar atelectasis. Lungs are otherwise clear. No pneumothorax or pleural effusion. Cardiac size within normal limits. Pulmonary vascularity is normal. No acute bone abnormality. IMPRESSION: No active disease. Electronically Signed   By: Helyn Numbers M.D.   On: 12/16/2022 17:14    Assessment/Plan 1. Cerebrovascular accident (CVA), unspecified mechanism (HCC) - hospitalized 10/07-10/11 - MRI brain MRI revealed patchy small volume acute ischemic nonhemorrhagic infarct to post left basal ganglia/corona radiata - neurology recommended DAPT x 90 days then asa 81 mg  - worsening right sided weakness - total care with supervised feeds at this time - if no progression recommend hospice  - cont PT/OT/ST - f/u with neuro in 8 weeks  2. Primary hypertension - controlled without medications - metoprolol discontinued   3. Hyperlipidemia LDL goal <70 - LDL 64 10/08 - cont  pravastatin  4. Stage 3b chronic kidney disease (HCC) - encourage hydration with water - avoid NSAIDS  5. Chronic midline low back pain without sciatica - norco discontinued  - cont standing order tylenol prn    Labs/tests ordered:  cbc/diff, bmp in 1 week Next appt:  01/02/2023

## 2022-12-26 ENCOUNTER — Encounter: Payer: Self-pay | Admitting: Sports Medicine

## 2022-12-26 ENCOUNTER — Non-Acute Institutional Stay (SKILLED_NURSING_FACILITY): Payer: Medicare Other | Admitting: Sports Medicine

## 2022-12-26 DIAGNOSIS — I639 Cerebral infarction, unspecified: Secondary | ICD-10-CM | POA: Diagnosis not present

## 2022-12-26 DIAGNOSIS — F0390 Unspecified dementia without behavioral disturbance: Secondary | ICD-10-CM

## 2022-12-26 DIAGNOSIS — N1832 Chronic kidney disease, stage 3b: Secondary | ICD-10-CM | POA: Diagnosis not present

## 2022-12-26 LAB — BASIC METABOLIC PANEL
BUN: 46 — AB (ref 4–21)
Chloride: 109 — AB (ref 99–108)
Creatinine: 1.1 (ref 0.5–1.1)
Glucose: 102
Potassium: 4.3 meq/L (ref 3.5–5.1)
Sodium: 143 (ref 137–147)

## 2022-12-26 LAB — COMPREHENSIVE METABOLIC PANEL
Calcium: 8.9 (ref 8.7–10.7)
eGFR: 47

## 2022-12-26 LAB — CBC AND DIFFERENTIAL
HCT: 38 (ref 36–46)
Hemoglobin: 12.3 (ref 12.0–16.0)
Platelets: 323 10*3/uL (ref 150–400)
WBC: 8.3

## 2022-12-26 LAB — CBC: RBC: 4.13 (ref 3.87–5.11)

## 2022-12-26 NOTE — Progress Notes (Signed)
Provider:  Venita Sheffield MD Location:   [facility] Room Number: N043-A Place of Service:  SNF (31)  PCP: Venita Sheffield, MD Patient Care Team: Venita Sheffield, MD as PCP - General (Internal Medicine) Chilton Si, MD as PCP - Cardiology (Cardiology) Myrtis Hopping, MD (Inactive) as Consulting Physician (Neurosurgery) Keturah Barre, MD as Consulting Physician (Otolaryngology) Meryl Dare, MD as Consulting Physician (Gastroenterology) Elliot Cousin, OD as Consulting Physician (Optometry) Sheran Luz, MD as Consulting Physician (Physical Medicine and Rehabilitation) Ranee Gosselin, MD as Consulting Physician (Orthopedic Surgery) [place] Chilton Si, MD as Attending Physician (Cardiology) Sherrie George, MD as Consulting Physician (Ophthalmology) Mast, Man X, NP as Nurse Practitioner (Internal Medicine)  Extended Emergency Contact Information Primary Emergency Contact: Gwenlyn Fudge of [place] Home Phone: [phone] Mobile Phone: [phone] Relation: Son Secondary Emergency Contact: Towanda Malkin [place] of [place] Mobile Phone: [phone] Relation: Other  Code Status:  Goals of Care: Advanced Directive information    12/26/2022    8:38 AM  Advanced Directives  Does Patient Have a Medical Advance Directive? No      Chief Complaint  Patient presents with   Admission    HPI: Patient is a 89 y.o. female seen today for admission to Skilled care after recent hospitalization for CVA.  Pt seen and examined this morning. Her speech is very difficult to understand, she answers most questions by yes or no.  She has left facial drooping.  Speech is currently working  Pt requiring help with all her ADLS , cannot transfer herself. Needs to be fed.  She can wheel herself   Past Medical History:  Diagnosis Date   Acute upper respiratory infections of  unspecified site 2013   Cellulitis and abscess of hand, except fingers and thumb 10/04/2010   Degeneration of intervertebral disc, site unspecified 2000   Diaphragmatic hernia without mention of obstruction or gangrene 08/15/1998   Diverticulosis of colon (without mention of hemorrhage) 2000   Hearing loss 09/28/2015   Hyperlipidemia 2007   Hypertension 2007   Impacted cerumen    Loss of weight 04/25/2011   Myalgia and myositis, unspecified 01/07/2007   Nontoxic uninodular goiter 08/15/1998   Osteoarthrosis, unspecified whether generalized or localized, unspecified site 07/22/2000   Osteoporosis 2000   Other drug allergy(995.27) 04/04/2011   Pain in limb 2012   Personal history of fall 2012   Pulmonary hypertension (HCC) 07/25/14   Mild-moderate   Spinal stenosis, unspecified region other than cervical 2002   Sprain of ankle, left    Supraventricular premature beats 10/2006   Tricuspid regurgitation 07/25/14   Mild-moderate   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 Lynnette Caffey   CATARACT EXTRACTION W/ INTRAOCULAR LENS IMPLANT Right 05/19/2014   Dr. Dione Booze   COLONOSCOPY  1992   acute segmental colitis Dr. Russella Dar   FACIAL COSMETIC SURGERY  1995   Dr. Charolotte Eke   IR KYPHO LUMBAR INC FX REDUCE BONE BX UNI/BIL CANNULATION INC/IMAGING  04/18/2020   LARYNGOSCOPY  1987   and biopsy  Dr. Haroldine Laws    reports that she has never smoked. She has never used smokeless tobacco. She reports that she does  not drink alcohol and does not use drugs. Social History   Socioeconomic History   Marital status: Widowed    Spouse name: Not on file   Number of children: Not on file   Years of education: Not on file   Highest education level: Not on file   Occupational History   Occupation: retired Film/video editor  Tobacco Use   Smoking status: Never   Smokeless tobacco: Never  Vaping Use   Vaping status: Never Used  Substance and Sexual Activity   Alcohol use: No   Drug use: No   Sexual activity: Never  Other Topics Concern   Not on file  Social History Narrative   Lives at [facility] since 7/[address]   Widowed   Never smoke   No alcohol   Exercise none   Walks with walker   Living Will , POA          Social Determinants of Health   Financial Resource Strain: Not on file  Food Insecurity: No Food Insecurity (12/17/2022)   Hunger Vital Sign    Worried About Running Out of Food in the Last Year: Never true    Ran Out of Food in the Last Year: Never true  Transportation Needs: No Transportation Needs (12/17/2022)   PRAPARE - Administrator, Civil Service (Medical): No    Lack of Transportation (Non-Medical): No  Physical Activity: Not on file  Stress: Not on file  Social Connections: Not on file  Intimate Partner Violence: Not At Risk (12/18/2022)   Humiliation, Afraid, Rape, and Kick questionnaire    Fear of Current or Ex-Partner: No    Emotionally Abused: No    Physically Abused: No    Sexually Abused: No    Functional Status Survey:    Family History  Problem Relation Age of Onset   Stroke Mother    Stroke Father    Hypertension Brother    Rheum arthritis Maternal Grandmother    Pancreatic cancer Sister     Health Maintenance  Topic Date Due   DTaP/Tdap/Td (2 - Tdap) 04/24/2021   INFLUENZA VACCINE  10/10/2022   DEXA SCAN  02/06/2023 (Originally 09/02/1991)   COVID-19 Vaccine (5 - 2023-24 season) 02/19/2023   Medicare Annual Wellness (AWV)  10/30/2023   Pneumonia Vaccine 24+ Years old (3 of 3 - PPSV23 or PCV20) 12/10/2023   Zoster Vaccines- Shingrix  Completed   HPV VACCINES  Aged Out    Allergies  Allergen Reactions   Doxycycline Itching   Lipitor [Atorvastatin] Other (See  Comments)    PAIN IN LEGS   Pantoprazole Itching   Pepcid [Famotidine] Itching    Allergies as of 12/26/2022       Reactions   Doxycycline Itching   Lipitor [atorvastatin] Other (See Comments)   PAIN IN LEGS   Pantoprazole Itching   Pepcid [famotidine] Itching        Medication List        Accurate as of December 26, 2022  8:39 AM. If you have any questions, ask your nurse or doctor.          aspirin EC 81 MG tablet Take 1 tablet (81 mg total) by mouth daily. Swallow whole.   clopidogrel 75 MG tablet Commonly known as: PLAVIX Take 1 tablet (75 mg total) by mouth daily.   pravastatin 40 MG tablet Commonly known as: PRAVACHOL Take 1 tablet (40 mg total) by mouth daily at 6 PM.  Review of Systems  Unable to perform ROS: Dementia  Constitutional:  Negative for fever.  Respiratory:  Negative for cough, shortness of breath and wheezing.   Cardiovascular:  Negative for chest pain, palpitations and leg swelling.  Gastrointestinal:  Negative for abdominal distention, abdominal pain, blood in stool, constipation, diarrhea, nausea and vomiting.  Genitourinary:  Negative for hematuria.  Neurological:  Positive for weakness.    Vitals:   12/26/22 0836  BP: 130/70  Pulse: 80  Resp: 16  Temp: 97.6 F (36.4 C)  SpO2: 96%  Weight: 104 lb 14.4 oz (47.6 kg)  Height: 5\' 5"  (1.651 m)   Body mass index is 17.46 kg/m. Physical Exam Constitutional:      Appearance: Normal appearance.  HENT:     Head: Normocephalic and atraumatic.  Cardiovascular:     Rate and Rhythm: Normal rate and regular rhythm.  Pulmonary:     Effort: Pulmonary effort is normal. No respiratory distress.     Breath sounds: Normal breath sounds. No wheezing.  Abdominal:     General: Bowel sounds are normal. There is no distension.     Tenderness: There is no abdominal tenderness. There is no guarding or rebound.  Musculoskeletal:        General: No swelling.  Skin:    General: Skin is  dry.  Neurological:     Mental Status: She is alert. Mental status is at baseline.     Comments: Left facial droop  Residual weakness on Rt side Left side strength intact 5/5  Able to follow commands      Labs reviewed: Basic Metabolic Panel: Recent Labs    05/07/22 0630 12/16/22 1447 12/16/22 2230 12/18/22 0506  NA 142 134*  --  136  K 4.0 4.5  --  3.6  CL 103 101  --  102  CO2 29 25  --  25  GLUCOSE 84 101*  --  101*  BUN 27* 24*  --  29*  CREATININE 1.25* 1.22* 1.34* 1.11*  CALCIUM 9.6 8.8*  --  9.1   Liver Function Tests: Recent Labs    05/07/22 0630 12/16/22 1447 12/18/22 0506  AST 15 17 19   ALT 6 11 12   ALKPHOS  --  51 53  BILITOT 0.7 0.6 1.1  PROT 6.6 6.2* 5.6*  ALBUMIN  --  3.5 3.2*   No results for input(s): "LIPASE", "AMYLASE" in the last 8760 hours. No results for input(s): "AMMONIA" in the last 8760 hours. CBC: Recent Labs    12/16/22 1447 12/16/22 2230 12/18/22 0506  WBC 8.2 9.6 8.8  NEUTROABS 4.9  --   --   HGB 12.5 13.0 13.4  HCT 38.4 39.4 39.3  MCV 91.9 90.4 88.5  PLT 261 249 256   Cardiac Enzymes: No results for input(s): "CKTOTAL", "CKMB", "CKMBINDEX", "TROPONINI" in the last 8760 hours. BNP: Invalid input(s): "POCBNP" Lab Results  Component Value Date   HGBA1C 5.6 12/16/2022   Lab Results  Component Value Date   TSH 1.52 11/10/2018   Lab Results  Component Value Date   VITAMINB12 142 (L) 12/17/2022   Lab Results  Component Value Date   FOLATE >20.0 09/19/2010   Lab Results  Component Value Date   IRON 18 (L) 09/19/2010   TIBC 207 (L) 09/19/2010   FERRITIN 62 12/09/2016    Imaging and Procedures obtained prior to SNF admission: VAS US CAROTID  Result Date: 12/17/2022 Carotid Arterial Duplex Study Patient Name:  RENDA BERNSTEIN  Date of  Exam:   12/17/2022 Medical Rec #: [id number]       Accession #:    [id number] Date of Birth: 02-16-1925       Patient Gender: F Patient Age:   68 years Exam Location:  [facility] Procedure:      VAS US CAROTID Referring Phys:  [facility] GO[facility] --------------------------------------------------------------------------------  Indications:       Bruit, carotid R09.89. Risk Factors:      Hyperlipidemia. Comparison Study:  No prior studies. Performing Technologist: Chanda Busing RVT  Examination Guidelines: A complete evaluation includes B-mode imaging, spectral Doppler, color Doppler, and power Doppler as needed of all accessible portions of each vessel. Bilateral testing is considered an integral part of a complete examination. Limited examinations for reoccurring indications may be performed as noted.  Right Carotid Findings: +----------+--------+--------+--------+-----------------------+--------+           PSV cm/sEDV cm/sStenosisPlaque Description     Comments +----------+--------+--------+--------+-----------------------+--------+ CCA Prox  62      6               smooth and heterogenous         +----------+--------+--------+--------+-----------------------+--------+ CCA Distal61      11              smooth and heterogenous         +----------+--------+--------+--------+-----------------------+--------+ ICA Prox  47      11              smooth and heterogenous         +----------+--------+--------+--------+-----------------------+--------+ ICA Mid   56      9                                               +----------+--------+--------+--------+-----------------------+--------+ ICA Distal54      8                                      tortuous +----------+--------+--------+--------+-----------------------+--------+ ECA       89      0                                               +----------+--------+--------+--------+-----------------------+--------+ +----------+--------+-------+--------+-------------------+           PSV cm/sEDV cmsDescribeArm Pressure (mmHG) +----------+--------+-------+--------+-------------------+ [id number]                                          +----------+--------+-------+--------+-------------------+ +---------+--------+--+--------+-+---------+ VertebralPSV cm/s46EDV cm/s9Antegrade +---------+--------+--+--------+-+---------+  Left Carotid Findings: +----------+--------+--------+--------+-----------------------+--------+           PSV cm/sEDV cm/sStenosisPlaque Description     Comments +----------+--------+--------+--------+-----------------------+--------+ CCA Prox  71      9               smooth and heterogenoustortuous +----------+--------+--------+--------+-----------------------+--------+ CCA Distal57      10              smooth and heterogenous         +----------+--------+--------+--------+-----------------------+--------+ ICA Prox  46      12  smooth and heterogenous         +----------+--------+--------+--------+-----------------------+--------+ ICA Mid   53      13              smooth and heterogenoustortuous +----------+--------+--------+--------+-----------------------+--------+ ICA Distal58      13                                     tortuous +----------+--------+--------+--------+-----------------------+--------+ ECA       52      1                                               +----------+--------+--------+--------+-----------------------+--------+ +----------+--------+--------+--------+-------------------+           PSV cm/sEDV cm/sDescribeArm Pressure (mmHG) +----------+--------+--------+--------+-------------------+ [id number]                                          +----------+--------+--------+--------+-------------------+ +---------+--------+--+--------+--+---------+ VertebralPSV cm/s43EDV cm/s10Antegrade +---------+--------+--+--------+--+---------+   Summary: Right Carotid: Velocities in the right ICA are consistent with a 1-39% stenosis. Left Carotid: Velocities in the left ICA are consistent with a  1-39% stenosis. Vertebrals: Bilateral vertebral arteries demonstrate antegrade flow. *See table(s) above for measurements and observations.  Electronically signed by Lemar Livings MD on 12/17/2022 at 6:04:15 PM.    Final    ECHOCARDIOGRAM COMPLETE  Result Date: 12/17/2022    ECHOCARDIOGRAM REPORT   Patient Name:   KRITIKA LAPOLE Date of Exam: 12/17/2022 Medical Rec #:  [id number]      Height:       65.0 in Accession #:    [id number]     Weight:       109.2 lb Date of Birth:  1924/06/03      BSA:          1.530 m Patient Age:    89 years       BP:           146/68 mmHg Patient Gender: F              HR:           80 bpm. Exam Location:  Inpatient Procedure: 2D Echo, Cardiac Doppler and Color Doppler Indications:    Stroke  History:        Patient has prior history of Echocardiogram examinations, most                 recent 05/01/2015. Pulmonary HTN; Risk Factors:Hypertension and                 Dyslipidemia.  Sonographer:    Milda Smart Referring Phys: [id number] [facility] GOEL  Sonographer Comments: Image acquisition challenging due to patient body habitus. IMPRESSIONS  1. Left ventricular ejection fraction, by estimation, is 60 to 65%. The left ventricle has normal function. The left ventricle has no regional wall motion abnormalities. There is mild concentric left ventricular hypertrophy. Left ventricular diastolic parameters are indeterminate.  2. Right ventricular systolic function is normal. The right ventricular size is normal.  3. The mitral valve is degenerative. Mild mitral valve regurgitation. Mild mitral stenosis.  4. Tricuspid valve regurgitation is moderate.  5. The aortic valve is normal in structure. Aortic valve regurgitation is  not visualized. No aortic stenosis is present.  6. The inferior vena cava is normal in size with greater than 50% respiratory variability, suggesting right atrial pressure of 3 mmHg. FINDINGS  Left Ventricle: Left ventricular ejection fraction, by estimation, is 60 to 65%. The  left ventricle has normal function. The left ventricle has no regional wall motion abnormalities. The left ventricular internal cavity size was normal in size. There is  mild concentric left ventricular hypertrophy. Left ventricular diastolic parameters are indeterminate. Right Ventricle: The right ventricular size is normal. No increase in right ventricular wall thickness. Right ventricular systolic function is normal. Left Atrium: Left atrial size was normal in size. Right Atrium: Right atrial size was normal in size. Pericardium: There is no evidence of pericardial effusion. Mitral Valve: The mitral valve is degenerative in appearance. Mild mitral valve regurgitation. Mild mitral valve stenosis. MV peak gradient, 2.9 mmHg. The mean mitral valve gradient is 1.0 mmHg. Tricuspid Valve: The tricuspid valve is normal in structure. Tricuspid valve regurgitation is moderate . No evidence of tricuspid stenosis. Aortic Valve: The aortic valve is normal in structure. Aortic valve regurgitation is not visualized. No aortic stenosis is present. Pulmonic Valve: The pulmonic valve was normal in structure. Pulmonic valve regurgitation is not visualized. No evidence of pulmonic stenosis. Aorta: The aortic root is normal in size and structure. Venous: The inferior vena cava is normal in size with greater than 50% respiratory variability, suggesting right atrial pressure of 3 mmHg. IAS/Shunts: No atrial level shunt detected by color flow Doppler.  LEFT VENTRICLE PLAX 2D LVIDd:         3.40 cm     Diastology LVIDs:         2.50 cm     LV e' medial:    3.26 cm/s LV PW:         1.20 cm     LV E/e' medial:  25.2 LV IVS:        1.20 cm     LV e' lateral:   4.68 cm/s LVOT diam:     2.00 cm     LV E/e' lateral: 17.6 LV SV:         61 LV SV Index:   40 LVOT Area:     3.14 cm  LV Volumes (MOD) LV vol d, MOD A2C: 40.0 ml LV vol d, MOD A4C: 35.9 ml LV vol s, MOD A2C: 15.9 ml LV vol s, MOD A4C: 11.2 ml LV SV MOD A2C:     24.1 ml LV SV MOD  A4C:     35.9 ml LV SV MOD BP:      24.5 ml RIGHT VENTRICLE             IVC RV Basal diam:  3.00 cm     IVC diam: 0.90 cm RV S prime:     10.90 cm/s TAPSE (M-mode): 1.7 cm LEFT ATRIUM             Index        RIGHT ATRIUM           Index LA diam:        3.30 cm 2.16 cm/m   RA Area:     11.60 cm LA Vol (A2C):   42.1 ml 27.52 ml/m  RA Volume:   21.10 ml  13.79 ml/m LA Vol (A4C):   30.0 ml 19.61 ml/m LA Biplane Vol: 35.4 ml 23.14 ml/m  AORTIC VALVE LVOT Vmax:   91.90 cm/s LVOT  Vmean:  67.300 cm/s LVOT VTI:    0.193 m  AORTA Ao Root diam: 3.10 cm Ao Asc diam:  3.10 cm MITRAL VALVE               TRICUSPID VALVE MV Area (PHT): 3.39 cm    TR Peak grad:   37.5 mmHg MV Area VTI:   2.83 cm    TR Mean grad:   28.0 mmHg MV Peak grad:  2.9 mmHg    TR Vmax:        306.00 cm/s MV Mean grad:  1.0 mmHg    TR Vmean:       256.0 cm/s MV Vmax:       0.85 m/s MV Vmean:      57.6 cm/s   SHUNTS MV Decel Time: 224 msec    Systemic VTI:  0.19 m MR Peak grad: 97.2 mmHg    Systemic Diam: 2.00 cm MR Vmax:      493.00 cm/s MV E velocity: 82.30 cm/s MV A velocity: 81.40 cm/s MV E/A ratio:  1.01 Kardie Tobb DO Electronically signed by Thomasene Ripple DO Signature Date/Time: 12/17/2022/3:31:21 PM    Final    MR ANGIO HEAD WO CONTRAST  Result Date: 12/17/2022 CLINICAL DATA:  89 year old female with TIA and small left corona radiata white matter infarcts on MRI yesterday. EXAM: MRA HEAD WITHOUT CONTRAST TECHNIQUE: Angiographic images of the Circle of Willis were acquired using MRA technique without intravenous contrast. COMPARISON:  Brain MRI 12/16/2022.  Intracranial MRA 12/20/2005. FINDINGS: Anterior circulation: Antegrade flow in the distal cervical ICAs and both ICA siphons. The left siphon appears dominant as in 2007, along with dominant left ACA A1 segment. No ICA siphon stenosis identified. Patent carotid termini, MCA and ACA origins. Chronically dominant left ACA A2 segment also. Visible ACA branches are within normal limits. Right  MCA M1 segment, MCA trifurcation and visible right MCA branches are within normal limits. Left MCA M1 segment remains patent but with new mild to moderate mid M1 irregularity and stenosis since 2007 on series 1018, image 1. Patent left MCA bifurcation without stenosis. Visible left MCA branches appear stable and within normal limits. Posterior circulation: Antegrade flow in the posterior circulation appears stable since 2007 with dominant left vertebral V4 segment. Left PICA origin is patent. Distal vertebral arteries, vertebrobasilar junction and basilar artery appear patent without stenosis. Patent SCA and PCA origins. Posterior communicating arteries are diminutive or absent. Right PCA branches are within normal limits. Mild to moderate new left P1/P2 segment junction irregularity and stenosis since 2007 (series 1030, image 5). Distal left PCA branches remain patent. Anatomic variants: Dominant distal left vertebral artery. Dominant left ICA siphon and left ACA. Other: No intracranial mass effect or ventriculomegaly. IMPRESSION: 1. Negative for large vessel occlusion. 2. Positive for mild to moderate intracranial atherosclerosis and stenosis affecting both the Left MCA M1 segment, also the Left PCA P1/P2, new since a 2007 MRA. Electronically Signed   By: Odessa Fleming M.D.   On: 12/17/2022 07:21   DG Chest 2 View  Result Date: 12/16/2022 CLINICAL DATA:  Stroke. EXAM: CHEST - 2 VIEW COMPARISON:  Earlier today FINDINGS: Bibasilar atelectasis related to lower lung volumes. Stable heart size and mediastinal contours. No pulmonary edema, pleural effusion or pneumothorax. IMPRESSION: Bibasilar atelectasis. Electronically Signed   By: Narda Rutherford M.D.   On: 12/16/2022 21:55   MR BRAIN WO CONTRAST  Result Date: 12/16/2022 CLINICAL DATA:  Initial evaluation for acute TIA. EXAM: MRI HEAD  WITHOUT CONTRAST TECHNIQUE: Multiplanar, multiecho pulse sequences of the brain and surrounding structures were obtained without  intravenous contrast. COMPARISON:  Prior study from 10/05/2006. FINDINGS: Brain: Generalized age-related cerebral atrophy. Patchy and confluent T2/FLAIR hyperintensity involving the periventricular and deep white matter both cerebral hemispheres as well as the pons, consistent with chronic small vessel ischemic disease, moderately advanced in nature. Few scatter remote lacunar infarcts present about the deep gray nuclei. Small remote right cerebellar infarct noted. She Patchy small volume restricted diffusion involving the posterior left basal ganglia/corona radiata, consistent with acute ischemic infarcts (series 5, images 28-30). No associated hemorrhage or mass effect. No other evidence for acute or subacute ischemia. No acute intracranial hemorrhage. Single punctate chronic microhemorrhage noted at the left thalamus. No mass lesion, midline shift or mass effect. No hydrocephalus or extra-axial fluid collection. Pituitary gland and suprasellar region within normal limits. Vascular: Major intracranial vascular flow voids are maintained. Skull and upper cervical spine: Craniocervical junction with normal limits. Bone marrow signal intensity normal. No scalp soft tissue abnormality. Sinuses/Orbits: Prior bilateral ocular lens replacement. Paranasal sinuses are clear. Moderate bilateral mastoid effusions noted, of doubtful significance. Visualized nasopharynx unremarkable. Other: None. IMPRESSION: 1. Patchy small volume acute ischemic nonhemorrhagic infarct involving the posterior left basal ganglia/corona radiata. 2. Underlying age-related cerebral atrophy with moderate chronic microvascular ischemic disease, with a few scattered remote lacunar infarcts about the deep gray nuclei and right cerebellum. Electronically Signed   By: Rise Mu M.D.   On: 12/16/2022 19:12   DG Chest 1 View  Result Date: 12/16/2022 CLINICAL DATA:  Fall EXAM: CHEST  1 VIEW COMPARISON:  10/12/2015 FINDINGS: Mild left basilar  atelectasis. Lungs are otherwise clear. No pneumothorax or pleural effusion. Cardiac size within normal limits. Pulmonary vascularity is normal. No acute bone abnormality. IMPRESSION: No active disease. Electronically Signed   By: Helyn Numbers M.D.   On: 12/16/2022 17:14    Assessment/Plan There are no diagnoses linked to this encounter.  CVA  MRI showed patchy small volume acute ischemic nonhemorrhagic infarct to post left basal ganglia/corona radiata.  2D echo with LVEF 60-65%   As per neuro , DAPT x 90 days then asa 81 mg daily.  No signs of bleeding  Cont with aspirin, plavix, pravastatin  Follow up with neurology  Cont with PT, OT, Speech therapy   Major Neurocognitive disorder  Pt requiring help with all her ADLS  Cont with supportive care   CKD stage 3     Latest Ref Rng & Units 12/18/2022    5:06 AM 12/16/2022   10:30 PM 12/16/2022    2:47 PM  BMP  Glucose 70 - 99 mg/dL 161   096   BUN 8 - 23 mg/dL 29   24   Creatinine 0.45 - 1.00 mg/dL 4.09  8.11  9.14   Sodium 135 - 145 mmol/L 136   134   Potassium 3.5 - 5.1 mmol/L 3.6   4.5   Chloride 98 - 111 mmol/L 102   101   CO2 22 - 32 mmol/L 25   25   Calcium 8.9 - 10.3 mg/dL 9.1   8.8    Avoid nephrotoxic meds   Family/ staff Communication: care plan discussed with the nursing staff  Labs/tests ordered: none  I spent greater than  35 minutes for the care of this patient in face to face time, chart review, clinical documentation, patient education.

## 2022-12-29 ENCOUNTER — Encounter: Payer: Self-pay | Admitting: Sports Medicine

## 2023-01-01 ENCOUNTER — Encounter: Payer: Medicare Other | Admitting: Family Medicine

## 2023-01-02 ENCOUNTER — Encounter: Payer: Medicare Other | Admitting: Nurse Practitioner

## 2023-01-17 ENCOUNTER — Encounter: Payer: Self-pay | Admitting: Nurse Practitioner

## 2023-01-17 ENCOUNTER — Non-Acute Institutional Stay (SKILLED_NURSING_FACILITY): Payer: Medicare Other | Admitting: Nurse Practitioner

## 2023-01-17 DIAGNOSIS — N1832 Chronic kidney disease, stage 3b: Secondary | ICD-10-CM

## 2023-01-17 DIAGNOSIS — R634 Abnormal weight loss: Secondary | ICD-10-CM

## 2023-01-17 DIAGNOSIS — I10 Essential (primary) hypertension: Secondary | ICD-10-CM

## 2023-01-17 DIAGNOSIS — I639 Cerebral infarction, unspecified: Secondary | ICD-10-CM

## 2023-01-17 DIAGNOSIS — Z66 Do not resuscitate: Secondary | ICD-10-CM | POA: Diagnosis not present

## 2023-01-17 DIAGNOSIS — E785 Hyperlipidemia, unspecified: Secondary | ICD-10-CM

## 2023-01-17 NOTE — Assessment & Plan Note (Signed)
Controlled, off meds.  

## 2023-01-17 NOTE — Progress Notes (Unsigned)
Location:  [facility] Room Number: 043-A Place of Service:  SNF [phone]) Provider:  Jackston [place] Sonia Baller, MD  Patient Care Team: Venita Sheffield, MD as PCP - General (Internal Medicine) Chilton Si, MD as PCP - Cardiology (Cardiology) Myrtis Hopping, MD (Inactive) as Consulting Physician (Neurosurgery) Keturah Barre, MD as Consulting Physician (Otolaryngology) Meryl Dare, MD as Consulting Physician (Gastroenterology) Elliot Cousin, OD as Consulting Physician (Optometry) Sheran Luz, MD as Consulting Physician (Physical Medicine and Rehabilitation) Ranee Gosselin, MD as Consulting Physician (Orthopedic Surgery) [place] [employer] Chilton Si, MD as Attending Physician (Cardiology) Sherrie George, MD as Consulting Physician (Ophthalmology) Domenique Southers X, NP as Nurse Practitioner (Internal Medicine)  Extended Emergency Contact Information Primary Emergency Contact: Gwenlyn Fudge of [place] Home Phone: [phone] Mobile Phone: [phone] Relation: Son Secondary Emergency Contact: Towanda Malkin [place] of [place] Mobile Phone: [phone] Relation: Other  Code Status:  DNR Goals of care: Advanced Directive information    01/17/2023    2:32 PM  Advanced Directives  Does Patient Have a Medical Advance Directive? Yes  Type of Estate agent of [place];Out of facility DNR (pink MOST or yellow form);Living will  Does patient want to make changes to medical advance directive? No - Patient declined  Copy of Healthcare Power of Attorney in Chart? Yes - validated most recent copy scanned in chart (See row information)  Pre-existing out of facility DNR order (yellow form or pink MOST form) Pink MOST form placed in chart (order not valid for inpatient use)     Chief Complaint  Patient presents with   Medical Management of Chronic Issues    Routine  visit and discuss tdap and flu vaccines.    HPI:  Pt is a 89 y.o. female seen today for medical management of chronic diseases.    Weight loss: about #6Ibs in the past month, denied GI symptoms. TSH 0.74 10/29/22  CVA right sided and facial weakness, 12/16/22 MRI patchy small volume acute ischemic nonhemorrhagic infarct to post left basal ganglia/corona radiata. DAPT x90 days then ASA 81mg  every day, f/u neurology. Hgb 12.3 12/26/22  HTN, blood pressure is controlled, off meds.              CKD Bun/creat 46/1.1 12/26/22             Hyperlipidemia, stable on Crestor 5mg  qd, LDL 64 12/17/22             OA pain, lower back is better since the patient is no longer ambulatory, f/u ortho             Constipation, stable             [facility] for care   Past Medical History:  Diagnosis Date   Acute upper respiratory infections of unspecified site 2013   Cellulitis and abscess of hand, except fingers and thumb 10/04/2010   Degeneration of intervertebral disc, site unspecified 2000   Diaphragmatic hernia without mention of obstruction or gangrene 08/15/1998   Diverticulosis of colon (without mention of hemorrhage) 2000   Hearing loss 09/28/2015   Hyperlipidemia 2007   Hypertension 2007   Impacted cerumen    Loss of weight 04/25/2011   Myalgia and myositis, unspecified 01/07/2007   Nontoxic uninodular goiter 08/15/1998   Osteoarthrosis, unspecified whether generalized or localized, unspecified site 07/22/2000   Osteoporosis 2000   Other drug allergy(995.27) 04/04/2011  Pain in limb 2012   Personal history of fall 2012   Pulmonary hypertension (HCC) 07/25/14   Mild-moderate   Spinal stenosis, unspecified region other than cervical 2002   Sprain of ankle, left    Supraventricular premature beats 10/2006   Tricuspid regurgitation 07/25/14   Mild-moderate   Unspecified late effects of cerebrovascular disease 01/01/2006   Urinary tract infection, site not specified    Urine frequency 01/20/2014    Urticaria, unspecified 11/01/2008   Vitamin D deficiency 04/25/2011   Past Surgical History:  Procedure Laterality Date   BACK SURGERY  1990   HNP L3-4 Dr. Lynnette Caffey   CATARACT EXTRACTION W/ INTRAOCULAR LENS IMPLANT Right 05/19/2014   Dr. Dione Booze   COLONOSCOPY  1992   acute segmental colitis Dr. Russella Dar   FACIAL COSMETIC SURGERY  1995   Dr. Charolotte Eke   IR [facility] LUMBAR INC FX REDUCE BONE BX UNI/BIL CANNULATION INC/IMAGING  04/18/2020   LARYNGOSCOPY  1987   and biopsy  Dr. Haroldine Laws    Allergies  Allergen Reactions   Doxycycline Itching   Lipitor [Atorvastatin] Other (See Comments)    PAIN IN LEGS   Pantoprazole Itching   Pepcid [Famotidine] Itching    Outpatient Encounter Medications as of 01/17/2023  Medication Sig   aspirin EC 81 MG tablet Take 1 tablet (81 mg total) by mouth daily. Swallow whole.   clopidogrel (PLAVIX) 75 MG tablet Take 1 tablet (75 mg total) by mouth daily.   pravastatin (PRAVACHOL) 40 MG tablet Take 1 tablet (40 mg total) by mouth daily at 6 PM.   No facility-administered encounter medications on file as of 01/17/2023.    Review of Systems  Constitutional:  Positive for unexpected weight change. Negative for appetite change, fatigue and fever.  HENT:  Positive for hearing loss. Negative for congestion and voice change.   Eyes:  Positive for visual disturbance.  Respiratory:  Negative for cough and shortness of breath.   Cardiovascular:  Negative for leg swelling.  Gastrointestinal:  Negative for abdominal pain, constipation and nausea.  Genitourinary:  Negative for dysuria, hematuria and urgency.       2x/night  Musculoskeletal:  Positive for back pain and gait problem.       Lower back pain, travels to legs sometimes, f/u Ortho, improved.   Skin:  Negative for color change.  Neurological:  Positive for weakness. Negative for speech difficulty and light-headedness.       Memory lapses.   Psychiatric/Behavioral:  Negative for behavioral problems and sleep  disturbance. The patient is not nervous/anxious.        The patient's son reported the patient expressed loneliness over the phone at times.     Immunization History  Administered Date(s) Administered   Fluad Quad(high Dose 65+) 12/25/2021   Influenza Whole 12/10/2011, 12/23/2012, 10/26/2017   Influenza, High Dose Seasonal PF 12/18/2016, 12/10/2018   Influenza,inj,quad, With Preservative 11/17/2017   Influenza-Unspecified 12/24/2013, 12/08/2014, 12/21/2015   Moderna Covid-19 Vaccine Bivalent Booster 10yrs & up 01/10/2022   Moderna Sars-Covid-2 Vaccination 03/15/2019, 04/12/2019   Pneumococcal Conjugate-13 12/10/2018   Pneumococcal Polysaccharide-23 03/12/1991   Td 04/25/2011   Unspecified SARS-COV-2 Vaccination 12/25/2022   Zoster Recombinant(Shingrix) 01/30/2018, 05/20/2018   Zoster, Live 09/04/2005   Pertinent  Health Maintenance Due  Topic Date Due   INFLUENZA VACCINE  10/10/2022   DEXA SCAN  02/06/2023 (Originally 09/02/1991)      02/14/2021    3:28 PM 01/17/2022    8:49 AM 02/14/2022    9:16 AM 05/02/2022  10:53 AM 08/28/2022    1:41 PM  Fall Risk  Falls in the past year? 0 0 0 0 0  Was there an injury with Fall? 0 0 0 0 0  Fall Risk Category Calculator 0 0 0 0 0  Fall Risk Category (Retired) Low Low Low    (RETIRED) Patient Fall Risk Level Low fall risk Low fall risk Low fall risk    Patient at Risk for Falls Due to No Fall Risks No Fall Risks No Fall Risks No Fall Risks No Fall Risks  Fall risk Follow up Falls evaluation completed;Education provided;Falls prevention discussed Falls evaluation completed Falls evaluation completed Falls evaluation completed Falls evaluation completed   Functional Status Survey:    Vitals:   01/17/23 1425  BP: 117/64  Pulse: 86  Resp: 18  Temp: 98.2 F (36.8 C)  SpO2: 99%  Weight: 103 lb (46.7 kg)  Height: 5\' 5"  (1.651 m)   Body mass index is 17.14 kg/m. Physical Exam Vitals and nursing note reviewed.  Constitutional:       Appearance: Normal appearance.  HENT:     Head: Normocephalic and atraumatic.     Mouth/Throat:     Mouth: Mucous membranes are moist.  Eyes:     Extraocular Movements: Extraocular movements intact.     Conjunctiva/sclera: Conjunctivae normal.     Pupils: Pupils are equal, round, and reactive to light.  Cardiovascular:     Rate and Rhythm: Normal rate and regular rhythm.     Heart sounds: No murmur heard. Pulmonary:     Breath sounds: No rales.     Comments: Occasionally wheezes at end of the expiration. Abdominal:     General: Bowel sounds are normal.     Palpations: Abdomen is soft.     Tenderness: There is no abdominal tenderness.  Musculoskeletal:     Cervical back: Normal range of motion and neck supple.     Right lower leg: No edema.     Left lower leg: No edema.     Comments: Ambulates with walker.   Skin:    General: Skin is warm and dry.  Neurological:     General: No focal deficit present.     Mental Status: She is alert and oriented to person, place, and time. Mental status is at baseline.     Cranial Nerves: Cranial nerve deficit present.     Motor: Weakness present.     Coordination: Coordination abnormal.     Gait: Gait abnormal.     Comments: Right sided facial and limb weakness.   Psychiatric:        Mood and Affect: Mood normal.        Behavior: Behavior normal.        Thought Content: Thought content normal.        Judgment: Judgment normal.     Labs reviewed: Recent Labs    05/07/22 0630 05/07/22 0630 12/16/22 1447 12/16/22 2230 12/18/22 0506 12/26/22 0000  NA 142  --  134*  --  136 143  K 4.0  --  4.5  --  3.6 4.3  CL 103  --  101  --  102 109*  CO2 29  --  25  --  25  --   GLUCOSE 84  --  101*  --  101*  --   BUN 27*  --  24*  --  29* 46*  CREATININE 1.25*   < > 1.22* 1.34* 1.11* 1.1  CALCIUM 9.6  --  8.8*  --  9.1 8.9   < > = values in this interval not displayed.   Recent Labs    05/07/22 0630 12/16/22 1447 12/18/22 0506  AST 15  17 19   ALT 6 11 12   ALKPHOS  --  51 53  BILITOT 0.7 0.6 1.1  PROT 6.6 6.2* 5.6*  ALBUMIN  --  3.5 3.2*   Recent Labs    12/16/22 1447 12/16/22 2230 12/18/22 0506 12/26/22 0000  WBC 8.2 9.6 8.8 8.3  NEUTROABS 4.9  --   --   --   HGB 12.5 13.0 13.4 12.3  HCT 38.4 39.4 39.3 38  MCV 91.9 90.4 88.5  --   PLT 261 249 256 323   Lab Results  Component Value Date   TSH 1.52 11/10/2018   Lab Results  Component Value Date   HGBA1C 5.6 12/16/2022   Lab Results  Component Value Date   CHOL 133 12/17/2022   HDL 56 12/17/2022   LDLCALC 64 12/17/2022   TRIG 64 12/17/2022   CHOLHDL 2.4 12/17/2022    Significant Diagnostic Results in last 30 days:  No results found.  Assessment/Plan Weight loss Weight loss: about #6Ibs in the past month, denied GI symptoms. TSH 0.74 10/29/22, f/u dietary recommendation.   Stroke [facility]) right sided and facial weakness, 12/16/22 MRI patchy small volume acute ischemic nonhemorrhagic infarct to post left basal ganglia/corona radiata. DAPT x90 days then ASA 81mg  every day, f/u neurology. Hgb 12.3 12/26/22  Hypertension Controlled, off meds.   CKD (chronic kidney disease) stage 3, GFR 30-59 ml/min (HCC) Bun/creat 46/1.1 12/26/22  Hyperlipidemia LDL goal <70  stable on Crestor 5mg  qd, LDL 64 12/17/22      Family/ staff Communication: plan of care reviewed with the patient and charge nurse.   Labs/tests ordered:  none  Time spend 30 minutes.

## 2023-01-17 NOTE — Assessment & Plan Note (Signed)
stable on Crestor 5mg  qd, LDL 64 12/17/22

## 2023-01-17 NOTE — Assessment & Plan Note (Signed)
Bun/creat 46/1.1 12/26/22

## 2023-01-17 NOTE — Assessment & Plan Note (Signed)
right sided and facial weakness, 12/16/22 MRI patchy small volume acute ischemic nonhemorrhagic infarct to post left basal ganglia/corona radiata. DAPT x90 days then ASA 81mg  every day, f/u neurology. Hgb 12.3 12/26/22

## 2023-01-17 NOTE — Assessment & Plan Note (Signed)
Weight loss: about #6Ibs in the past month, denied GI symptoms. TSH 0.74 10/29/22, f/u dietary recommendation.

## 2023-01-20 ENCOUNTER — Encounter: Payer: Self-pay | Admitting: Nurse Practitioner

## 2023-01-21 ENCOUNTER — Encounter: Payer: Self-pay | Admitting: Nurse Practitioner

## 2023-01-21 DIAGNOSIS — R627 Adult failure to thrive: Secondary | ICD-10-CM | POA: Insufficient documentation

## 2023-02-13 DIAGNOSIS — I69391 Dysphagia following cerebral infarction: Secondary | ICD-10-CM | POA: Diagnosis not present

## 2023-02-13 DIAGNOSIS — I69351 Hemiplegia and hemiparesis following cerebral infarction affecting right dominant side: Secondary | ICD-10-CM | POA: Diagnosis not present

## 2023-02-13 DIAGNOSIS — I69320 Aphasia following cerebral infarction: Secondary | ICD-10-CM | POA: Diagnosis not present

## 2023-02-13 DIAGNOSIS — R278 Other lack of coordination: Secondary | ICD-10-CM | POA: Diagnosis not present

## 2023-02-13 DIAGNOSIS — M6281 Muscle weakness (generalized): Secondary | ICD-10-CM | POA: Diagnosis not present

## 2023-02-14 ENCOUNTER — Non-Acute Institutional Stay (SKILLED_NURSING_FACILITY): Payer: Medicare Other | Admitting: Nurse Practitioner

## 2023-02-14 ENCOUNTER — Encounter: Payer: Self-pay | Admitting: Nurse Practitioner

## 2023-02-14 DIAGNOSIS — I639 Cerebral infarction, unspecified: Secondary | ICD-10-CM | POA: Diagnosis not present

## 2023-02-14 DIAGNOSIS — E785 Hyperlipidemia, unspecified: Secondary | ICD-10-CM

## 2023-02-14 DIAGNOSIS — R627 Adult failure to thrive: Secondary | ICD-10-CM | POA: Diagnosis not present

## 2023-02-14 DIAGNOSIS — N1832 Chronic kidney disease, stage 3b: Secondary | ICD-10-CM

## 2023-02-14 DIAGNOSIS — K5909 Other constipation: Secondary | ICD-10-CM

## 2023-02-14 DIAGNOSIS — I10 Essential (primary) hypertension: Secondary | ICD-10-CM | POA: Diagnosis not present

## 2023-02-14 NOTE — Assessment & Plan Note (Signed)
 stable on Crestor 5mg  qd, LDL 64 12/17/22

## 2023-02-14 NOTE — Assessment & Plan Note (Signed)
 Bun/creat 46/1.1 12/26/22

## 2023-02-14 NOTE — Assessment & Plan Note (Signed)
Blood pressure is controlled, off meds.  

## 2023-02-14 NOTE — Assessment & Plan Note (Addendum)
gradual weight loss, TSH 0.74 10/29/22, under Hospice care.  01/21/23 MOST comfort measures, IVF/ABT trial periods, no feeding tube.

## 2023-02-14 NOTE — Assessment & Plan Note (Signed)
CVA right sided and facial weakness, 12/16/22 MRI patchy small volume acute ischemic nonhemorrhagic infarct to post left basal ganglia/corona radiata. DAPT x90 days then ASA 81mg  every day, f/u neurology. Hgb 12.3 12/26/22

## 2023-02-14 NOTE — Assessment & Plan Note (Signed)
stable °

## 2023-02-14 NOTE — Progress Notes (Unsigned)
Location:   [facility] Room Number: 86 Place of Service:  SNF (31) Provider: Arna Snipe Evelio Rueda NP  Venita Sheffield, MD  Patient Care Team: Venita Sheffield, MD as PCP - General (Internal Medicine) Chilton Si, MD as PCP - Cardiology (Cardiology) Myrtis Hopping, MD (Inactive) as Consulting Physician (Neurosurgery) Keturah Barre, MD as Consulting Physician (Otolaryngology) Meryl Dare, MD as Consulting Physician (Gastroenterology) Elliot Cousin, OD as Consulting Physician (Optometry) Sheran Luz, MD as Consulting Physician (Physical Medicine and Rehabilitation) Ranee Gosselin, MD as Consulting Physician (Orthopedic Surgery) [place], [facility] Chilton Si, MD as Attending Physician (Cardiology) Sherrie George, MD as Consulting Physician (Ophthalmology) Brehanna Deveny X, NP as Nurse Practitioner (Internal Medicine)  Extended Emergency Contact Information Primary Emergency Contact: Gwenlyn Fudge of [place] Home Phone: [phone] Mobile Phone: [phone] Relation: Son Secondary Emergency Contact: Towanda [place]in [place] of [place] Mobile Phone: [phone] Relation: Other  Code Status:  DNR Goals of care: Advanced Directive information    01/17/2023    2:32 PM  Advanced Directives  Does Patient Have a Medical Advance Directive? Yes  Type of Estate agent of [place];Out of facility DNR (pink MOST or yellow form);Living will  Does patient want to make changes to medical advance directive? No - Patient declined  Copy of Healthcare Power of Attorney in Chart? Yes - validated most recent copy scanned in chart (See row information)  Pre-existing out of facility DNR order (yellow form or pink MOST form) Pink MOST form placed in chart (order not valid for inpatient use)     Chief Complaint  Patient presents with  . Medical Management of Chronic Issues    HPI:  Pt is a 89 y.o.  female seen today for medical management of chronic diseases.      Adult failure to thrive, gradual weight loss, TSH 0.74 10/29/22, under Hospice care. 01/21/23 MOST comfort measures, IVF/ABT trial periods, no feeding tube.              CVA right sided and facial weakness, 12/16/22 MRI patchy small volume acute ischemic nonhemorrhagic infarct to post left basal ganglia/corona radiata. DAPT x90 days then ASA 81mg  every day, f/u neurology. Hgb 12.3 12/26/22             HTN, blood pressure is controlled, off meds.              CKD Bun/creat 46/1.1 12/26/22             Hyperlipidemia, stable on Crestor 5mg  qd, LDL 64 12/17/22             OA pain, lower back is better since the patient is no longer ambulatory, f/u ortho             Constipation, stable             [facility] for care   Past Medical History:  Diagnosis Date  . Acute upper respiratory infections of unspecified site 2013  . Cellulitis and abscess of hand, except fingers and thumb 10/04/2010  . Degeneration of intervertebral disc, site unspecified 2000  . Diaphragmatic hernia without mention of obstruction or gangrene 08/15/1998  . Diverticulosis of colon (without mention of hemorrhage) 2000  . Hearing loss 09/28/2015  . Hyperlipidemia 2007  . Hypertension 2007  . Impacted cerumen   . Loss of weight 04/25/2011  . Myalgia and myositis, unspecified 01/07/2007  . Nontoxic uninodular goiter  08/15/1998  . Osteoarthrosis, unspecified whether generalized or localized, unspecified site 07/22/2000  . Osteoporosis 2000  . Other drug allergy(995.27) 04/04/2011  . Pain in limb 2012  . Personal history of fall 2012  . Pulmonary hypertension (HCC) 07/25/14   Mild-moderate  . Spinal stenosis, unspecified region other than cervical 2002  . Sprain of ankle, left   . Supraventricular premature beats 10/2006  . Tricuspid regurgitation 07/25/14   Mild-moderate  . 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 Urinary tract infection,  site not specified   . Urine frequency 01/20/2014  . Urticaria, unspecified 11/01/2008  . Vitamin D deficiency 04/25/2011   Past Surgical History:  Procedure Laterality Date  . BACK SURGERY  1990   HNP L3-4 Dr. Lynnette Caffey  . CATARACT EXTRACTION W/ INTRAOCULAR LENS IMPLANT Right 05/19/2014   Dr. Dione Booze  . COLONOSCOPY  1992   acute segmental colitis Dr. Russella Dar  . FACIAL COSMETIC SURGERY  1995   Dr. Charolotte Eke  . IR [facility] LUMBAR INC FX REDUCE BONE BX UNI/BIL CANNULATION INC/IMAGING  04/18/2020  . LARYNGOSCOPY  1987   and biopsy  Dr. Haroldine Laws    Allergies  Allergen Reactions  . Doxycycline Itching  . Lipitor [Atorvastatin] Other (See Comments)    PAIN IN LEGS  . Pantoprazole Itching  . Pepcid [Famotidine] Itching    Allergies as of 02/14/2023       Reactions   Doxycycline Itching   Lipitor [atorvastatin] Other (See Comments)   PAIN IN LEGS   Pantoprazole Itching   Pepcid [famotidine] Itching        Medication List        Accurate as of February 14, 2023 11:59 PM. If you have any questions, ask your nurse or doctor.          aspirin EC 81 MG tablet Take 1 tablet (81 mg total) by mouth daily. Swallow whole.   clopidogrel 75 MG tablet Commonly known as: PLAVIX Take 1 tablet (75 mg total) by mouth daily.   pravastatin 40 MG tablet Commonly known as: PRAVACHOL Take 1 tablet (40 mg total) by mouth daily at 6 PM.        Review of Systems  Constitutional:  Positive for unexpected weight change. Negative for appetite change, fatigue and fever.  HENT:  Positive for hearing loss. Negative for congestion and voice change.   Eyes:  Positive for visual disturbance.  Respiratory:  Negative for cough and shortness of breath.   Cardiovascular:  Negative for leg swelling.  Gastrointestinal:  Negative for abdominal pain, constipation and nausea.  Genitourinary:  Negative for dysuria, hematuria and urgency.       2x/night  Musculoskeletal:  Positive for back pain and gait  problem.       Hx of lower back pain, travels to legs sometimes, no c/o since bedrest   Skin:  Negative for color change.  Neurological:  Positive for weakness. Negative for speech difficulty and light-headedness.       Memory lapses.   Psychiatric/Behavioral:  Negative for behavioral problems and sleep disturbance. The patient is not nervous/anxious.        The patient's son reported the patient expressed loneliness over the phone at times.     Immunization History  Administered Date(s) Administered  . Fluad Quad(high Dose 65+) 12/25/2021  . Influenza Whole 12/10/2011, 12/23/2012, 10/26/2017  . Influenza, High Dose Seasonal PF 12/18/2016, 12/10/2018  . Influenza,inj,quad, With Preservative 11/17/2017  . Influenza-Unspecified 12/24/2013, 12/08/2014, 12/21/2015  . Moderna Covid-19  Vaccine Bivalent Booster 65yrs & up 01/10/2022  . Moderna Sars-Covid-2 Vaccination 03/15/2019, 04/12/2019  . Pneumococcal Conjugate-13 12/10/2018  . Pneumococcal Polysaccharide-23 03/12/1991  . Td 04/25/2011  . Unspecified SARS-COV-2 Vaccination 12/25/2022  . Zoster Recombinant(Shingrix) 01/30/2018, 05/20/2018  . Zoster, Live 09/04/2005   Pertinent  Health Maintenance Due  Topic Date Due  . DEXA SCAN  Never done  . INFLUENZA VACCINE  10/10/2022      02/14/2021    3:28 PM 01/17/2022    8:49 AM 02/14/2022    9:16 AM 05/02/2022   10:53 AM 08/28/2022    1:41 PM  Fall Risk  Falls in the past year? 0 0 0 0 0  Was there an injury with Fall? 0 0 0 0 0  Fall Risk Category Calculator 0 0 0 0 0  Fall Risk Category (Retired) Low Low Low    (RETIRED) Patient Fall Risk Level Low fall risk Low fall risk Low fall risk    Patient at Risk for Falls Due to No Fall Risks No Fall Risks No Fall Risks No Fall Risks No Fall Risks  Fall risk Follow up Falls evaluation completed;Education provided;Falls prevention discussed Falls evaluation completed Falls evaluation completed Falls evaluation completed Falls evaluation  completed   Functional Status Survey:    Vitals:   02/14/23 1526  BP: 120/71  Pulse: 74  Resp: 16  Temp: 97.6 F (36.4 C)  SpO2: 96%  Weight: 101 lb (45.8 kg)   Body mass index is 16.81 kg/m. Physical Exam Vitals and nursing note reviewed.  Constitutional:      Appearance: Normal appearance.  HENT:     Head: Normocephalic and atraumatic.     Mouth/Throat:     Mouth: Mucous membranes are moist.  Eyes:     Extraocular Movements: Extraocular movements intact.     Conjunctiva/sclera: Conjunctivae normal.     Pupils: Pupils are equal, round, and reactive to light.  Cardiovascular:     Rate and Rhythm: Normal rate and regular rhythm.     Heart sounds: No murmur heard. Pulmonary:     Breath sounds: No rales.     Comments: Occasionally wheezes at end of the expiration. Abdominal:     General: Bowel sounds are normal.     Palpations: Abdomen is soft.     Tenderness: There is no abdominal tenderness.  Musculoskeletal:     Cervical back: Normal range of motion and neck supple.     Right lower leg: No edema.     Left lower leg: No edema.     Comments: Ambulates with walker.   Skin:    General: Skin is warm and dry.     Findings: Bruising present.     Comments: Right 3rd, 4th fingers ecchymosis, no redness or swelling,  no deformity or reduced ROM or pain with PROM  Neurological:     Mental Status: She is alert. Mental status is at baseline.     Cranial Nerves: Cranial nerve deficit present.     Motor: Weakness present.     Coordination: Coordination abnormal.     Gait: Gait abnormal.     Comments: Right sided facial and limb weakness.  Oriented to person and place.   Psychiatric:        Mood and Affect: Mood normal.        Behavior: Behavior normal.        Thought Content: Thought content normal.        Judgment: Judgment normal.  Labs reviewed: Recent Labs    05/07/22 0630 05/07/22 0630 12/16/22 1447 12/16/22 2230 12/18/22 0506 12/26/22 0000  NA 142  --   134*  --  136 143  K 4.0  --  4.5  --  3.6 4.3  CL 103  --  101  --  102 109*  CO2 29  --  25  --  25  --   GLUCOSE 84  --  101*  --  101*  --   BUN 27*  --  24*  --  29* 46*  CREATININE 1.25*   < > 1.22* 1.34* 1.11* 1.1  CALCIUM 9.6  --  8.8*  --  9.1 8.9   < > = values in this interval not displayed.   Recent Labs    05/07/22 0630 12/16/22 1447 12/18/22 0506  AST 15 17 19   ALT 6 11 12   ALKPHOS  --  51 53  BILITOT 0.7 0.6 1.1  PROT 6.6 6.2* 5.6*  ALBUMIN  --  3.5 3.2*   Recent Labs    12/16/22 1447 12/16/22 2230 12/18/22 0506 12/26/22 0000  WBC 8.2 9.6 8.8 8.3  NEUTROABS 4.9  --   --   --   HGB 12.5 13.0 13.4 12.3  HCT 38.4 39.4 39.3 38  MCV 91.9 90.4 88.5  --   PLT 261 249 256 323   Lab Results  Component Value Date   TSH 1.52 11/10/2018   Lab Results  Component Value Date   HGBA1C 5.6 12/16/2022   Lab Results  Component Value Date   CHOL 133 12/17/2022   HDL 56 12/17/2022   LDLCALC 64 12/17/2022   TRIG 64 12/17/2022   CHOLHDL 2.4 12/17/2022    Significant Diagnostic Results in last 30 days:  No results found.  Assessment/Plan  Adult failure to thrive gradual weight loss, TSH 0.74 10/29/22, under Hospice care.  01/21/23 MOST comfort measures, IVF/ABT trial periods, no feeding tube.   Stroke [facility]) CVA right sided and facial weakness, 12/16/22 MRI patchy small volume acute ischemic nonhemorrhagic infarct to post left basal ganglia/corona radiata. DAPT x90 days then ASA 81mg  every day, f/u neurology. Hgb 12.3 12/26/22  Hypertension Blood pressure is controlled, off meds.   CKD (chronic kidney disease) stage 3, GFR 30-59 ml/min (HCC) Bun/creat 46/1.1 12/26/22  Hyperlipidemia LDL goal <70  stable on Crestor 5mg  qd, LDL 64 12/17/22  Chronic constipation stable   Family/ staff Communication: plan of care reviewed with the patient and charge nurse.   Labs/tests ordered:  none  Time spend 30 minutes.

## 2023-02-18 ENCOUNTER — Non-Acute Institutional Stay (SKILLED_NURSING_FACILITY): Admitting: Nurse Practitioner

## 2023-02-18 ENCOUNTER — Encounter: Payer: Self-pay | Admitting: Nurse Practitioner

## 2023-02-18 DIAGNOSIS — I639 Cerebral infarction, unspecified: Secondary | ICD-10-CM

## 2023-02-18 DIAGNOSIS — R1310 Dysphagia, unspecified: Secondary | ICD-10-CM | POA: Diagnosis not present

## 2023-02-18 DIAGNOSIS — I10 Essential (primary) hypertension: Secondary | ICD-10-CM

## 2023-02-18 DIAGNOSIS — R627 Adult failure to thrive: Secondary | ICD-10-CM

## 2023-02-18 DIAGNOSIS — E785 Hyperlipidemia, unspecified: Secondary | ICD-10-CM | POA: Diagnosis not present

## 2023-02-18 DIAGNOSIS — N1832 Chronic kidney disease, stage 3b: Secondary | ICD-10-CM | POA: Diagnosis not present

## 2023-02-18 NOTE — Progress Notes (Signed)
Location:   [facility] Room Number: 77 Place of Service:  SNF (31) Provider: Arna Snipe Jyoti Harju NP  Venita Sheffield, MD  Patient Care Team: Venita Sheffield, MD as PCP - General (Internal Medicine) Chilton Si, MD as PCP - Cardiology (Cardiology) Myrtis Hopping, MD (Inactive) as Consulting Physician (Neurosurgery) Keturah Barre, MD as Consulting Physician (Otolaryngology) Meryl Dare, MD as Consulting Physician (Gastroenterology) Elliot Cousin, OD as Consulting Physician (Optometry) Sheran Luz, MD as Consulting Physician (Physical Medicine and Rehabilitation) Ranee Gosselin, MD as Consulting Physician (Orthopedic Surgery) [place], [facility] Chilton Si, MD as Attending Physician (Cardiology) Sherrie George, MD as Consulting Physician (Ophthalmology) Radin Raptis X, NP as Nurse Practitioner (Internal Medicine)  Extended Emergency Contact Information Primary Emergency Contact: Gwenlyn Fudge of [place] Home Phone: [phone] Mobile Phone: [phone] Relation: Son Secondary Emergency Contact: Towanda Malk[place] of [place] Mobile Phone: [phone] Relation: Other  Code Status: DNR Goals of care: Advanced Directive information    01/17/2023    2:32 PM  Advanced Directives  Does Patient Have a Medical Advance Directive? Yes  Type of Estate agent of [place];Out of facility DNR (pink MOST or yellow form);Living will  Does patient want to make changes to medical advance directive? No - Patient declined  Copy of Healthcare Power of Attorney in Chart? Yes - validated most recent copy scanned in chart (See row information)  Pre-existing out of facility DNR order (yellow form or pink MOST form) Pink MOST form placed in chart (order not valid for inpatient use)     Chief Complaint  Patient presents with   Acute Visit    Cough when swallowing     HPI:  Pt is a 89 y.o.  female seen today for an acute visit for cough when swallowing, afebrile, no O2 desaturation. The patient was in bed sleeping deep, able to be arouse after spoke and touched her.   Dysphagia, ST, Omeprazole     Adult failure to thrive, gradual weight loss, TSH 0.74 10/29/22, under Hospice care. 01/21/23 MOST comfort measures, IVF/ABT trial periods, no feeding tube. On Mirtazapine 7.5mg /15mg  02/18/23             CVA right sided and facial weakness, 12/16/22 MRI patchy small volume acute ischemic nonhemorrhagic infarct to post left basal ganglia/corona radiata. DAPT x90 days then ASA 81mg  every day, f/u neurology. Hgb 12.3 12/26/22             HTN, blood pressure runs low,  off meds.              CKD Bun/creat 46/1.1 12/26/22             Hyperlipidemia, stable on Crestor 5mg  qd, LDL 64 12/17/22             OA pain, lower back is better since the patient is no longer ambulatory, f/u ortho             Constipation, stable             [facility] for care       Past Medical History:  Diagnosis Date   Acute upper respiratory infections of unspecified site 2013   Cellulitis and abscess of hand, except fingers and thumb 10/04/2010   Degeneration of intervertebral disc, site unspecified 2000   Diaphragmatic hernia without mention of obstruction or gangrene 08/15/1998   Diverticulosis of colon (without mention of hemorrhage)  2000   Hearing loss 09/28/2015   Hyperlipidemia 2007   Hypertension 2007   Impacted cerumen    Loss of weight 04/25/2011   Myalgia and myositis, unspecified 01/07/2007   Nontoxic uninodular goiter 08/15/1998   Osteoarthrosis, unspecified whether generalized or localized, unspecified site 07/22/2000   Osteoporosis 2000   Other drug allergy(995.27) 04/04/2011   Pain in limb 2012   Personal history of fall 2012   Pulmonary hypertension (HCC) 07/25/14   Mild-moderate   Spinal stenosis, unspecified region other than cervical 2002   Sprain of ankle, left    Supraventricular premature  beats 10/2006   Tricuspid regurgitation 07/25/14   Mild-moderate   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 Lynnette Caffey   CATARACT EXTRACTION W/ INTRAOCULAR LENS IMPLANT Right 05/19/2014   Dr. Dione Booze   COLONOSCOPY  1992   acute segmental colitis Dr. Russella Dar   FACIAL COSMETIC SURGERY  1995   Dr. Charolotte Eke   IR [facility] LUMBAR INC FX REDUCE BONE BX UNI/BIL CANNULATION INC/IMAGING  04/18/2020   LARYNGOSCOPY  1987   and biopsy  Dr. Haroldine Laws    Allergies  Allergen Reactions   Doxycycline Itching   Lipitor [Atorvastatin] Other (See Comments)    PAIN IN LEGS   Pantoprazole Itching   Pepcid [Famotidine] Itching    Allergies as of 02/18/2023       Reactions   Doxycycline Itching   Lipitor [atorvastatin] Other (See Comments)   PAIN IN LEGS   Pantoprazole Itching   Pepcid [famotidine] Itching        Medication List        Accurate as of February 18, 2023  2:19 PM. If you have any questions, ask your nurse or doctor.          aspirin EC 81 MG tablet Take 1 tablet (81 mg total) by mouth daily. Swallow whole.   clopidogrel 75 MG tablet Commonly known as: PLAVIX Take 1 tablet (75 mg total) by mouth daily.   pravastatin 40 MG tablet Commonly known as: PRAVACHOL Take 1 tablet (40 mg total) by mouth daily at 6 PM.        Review of Systems  Constitutional:  Positive for fatigue and unexpected weight change. Negative for fever.  HENT:  Positive for hearing loss. Negative for congestion and voice change.   Eyes:  Positive for visual disturbance.  Respiratory:  Positive for cough. Negative for shortness of breath.        When swallowing.   Cardiovascular:  Negative for leg swelling.  Gastrointestinal:  Negative for  abdominal pain, constipation and nausea.  Genitourinary:  Negative for dysuria, hematuria and urgency.       2x/night  Musculoskeletal:  Positive for back pain and gait problem.       Hx of lower back pain, travels to legs sometimes, no c/o since bedrest   Skin:  Negative for color change.  Neurological:  Positive for weakness. Negative for speech difficulty and light-headedness.       Memory lapses.   Psychiatric/Behavioral:  Negative for behavioral problems and sleep disturbance. The patient is not nervous/anxious.        The patient's son reported the patient expressed loneliness over the phone at times.  Immunization History  Administered Date(s) Administered   Fluad Quad(high Dose 65+) 12/25/2021   Influenza Whole 12/10/2011, 12/23/2012, 10/26/2017   Influenza, High Dose Seasonal PF 12/18/2016, 12/10/2018   Influenza,inj,quad, With Preservative 11/17/2017   Influenza-Unspecified 12/24/2013, 12/08/2014, 12/21/2015   Moderna Covid-19 Vaccine Bivalent Booster 71yrs & up 01/10/2022   Moderna Sars-Covid-2 Vaccination 03/15/2019, 04/12/2019   Pneumococcal Conjugate-13 12/10/2018   Pneumococcal Polysaccharide-23 03/12/1991   Td 04/25/2011   Unspecified SARS-COV-2 Vaccination 12/25/2022   Zoster Recombinant(Shingrix) 01/30/2018, 05/20/2018   Zoster, Live 09/04/2005   Pertinent  Health Maintenance Due  Topic Date Due   DEXA SCAN  Never done   INFLUENZA VACCINE  10/10/2022      02/14/2021    3:28 PM 01/17/2022    8:49 AM 02/14/2022    9:16 AM 05/02/2022   10:53 AM 08/28/2022    1:41 PM  Fall Risk  Falls in the past year? 0 0 0 0 0  Was there an injury with Fall? 0 0 0 0 0  Fall Risk Category Calculator 0 0 0 0 0  Fall Risk Category (Retired) Low Low Low    (RETIRED) Patient Fall Risk Level Low fall risk Low fall risk Low fall risk    Patient at Risk for Falls Due to No Fall Risks No Fall Risks No Fall Risks No Fall Risks No Fall Risks  Fall risk Follow up Falls evaluation  completed;Education provided;Falls prevention discussed Falls evaluation completed Falls evaluation completed Falls evaluation completed Falls evaluation completed   Functional Status Survey:    Vitals:   02/18/23 1405  BP: (!) 98/54  Pulse: 70  Resp: 16  Temp: (!) 96.7 F (35.9 C)  SpO2: 96%  Weight: 98 lb 11.2 oz (44.8 kg)   Body mass index is 16.42 kg/m. Physical Exam Vitals and nursing note reviewed.  Constitutional:      Comments: Appears sleepy  HENT:     Head: Normocephalic and atraumatic.     Mouth/Throat:     Mouth: Mucous membranes are moist.  Eyes:     Extraocular Movements: Extraocular movements intact.     Conjunctiva/sclera: Conjunctivae normal.     Pupils: Pupils are equal, round, and reactive to light.  Cardiovascular:     Rate and Rhythm: Normal rate and regular rhythm.     Heart sounds: No murmur heard. Pulmonary:     Breath sounds: No rales.     Comments: Occasionally wheezes at end of the expiration. Abdominal:     General: Bowel sounds are normal.     Palpations: Abdomen is soft.     Tenderness: There is no abdominal tenderness.  Musculoskeletal:     Cervical back: Normal range of motion and neck supple.     Right lower leg: No edema.     Left lower leg: No edema.     Comments: Ambulates with walker.   Skin:    General: Skin is warm and dry.     Findings: Bruising present.     Comments: Right 3rd, 4th fingers ecchymosis, no redness or swelling,  no deformity or reduced ROM or pain with PROM  Neurological:     Mental Status: She is alert. Mental status is at baseline.     Cranial Nerves: Cranial nerve deficit present.     Motor: Weakness present.     Coordination: Coordination abnormal.     Gait: Gait abnormal.     Comments: Right sided facial and limb weakness.  Oriented to person and place.  Psychiatric:        Mood and Affect: Mood normal.        Behavior: Behavior normal.        Thought Content: Thought content normal.         Judgment: Judgment normal.     Labs reviewed: Recent Labs    05/07/22 0630 05/07/22 0630 12/16/22 1447 12/16/22 2230 12/18/22 0506 12/26/22 0000  NA 142  --  134*  --  136 143  K 4.0  --  4.5  --  3.6 4.3  CL 103  --  101  --  102 109*  CO2 29  --  25  --  25  --   GLUCOSE 84  --  101*  --  101*  --   BUN 27*  --  24*  --  29* 46*  CREATININE 1.25*   < > 1.22* 1.34* 1.11* 1.1  CALCIUM 9.6  --  8.8*  --  9.1 8.9   < > = values in this interval not displayed.   Recent Labs    05/07/22 0630 12/16/22 1447 12/18/22 0506  AST 15 17 19   ALT 6 11 12   ALKPHOS  --  51 53  BILITOT 0.7 0.6 1.1  PROT 6.6 6.2* 5.6*  ALBUMIN  --  3.5 3.2*   Recent Labs    12/16/22 1447 12/16/22 2230 12/18/22 0506 12/26/22 0000  WBC 8.2 9.6 8.8 8.3  NEUTROABS 4.9  --   --   --   HGB 12.5 13.0 13.4 12.3  HCT 38.4 39.4 39.3 38  MCV 91.9 90.4 88.5  --   PLT 261 249 256 323   Lab Results  Component Value Date   TSH 1.52 11/10/2018   Lab Results  Component Value Date   HGBA1C 5.6 12/16/2022   Lab Results  Component Value Date   CHOL 133 12/17/2022   HDL 56 12/17/2022   LDLCALC 64 12/17/2022   TRIG 64 12/17/2022   CHOLHDL 2.4 12/17/2022    Significant Diagnostic Results in last 30 days:  No results found.  Assessment/Plan: Dysphagia cough when swallowing, afebrile, no O2 desaturation. The patient was in bed sleeping deep, able to be arouse after spoke and touched her.  Adding Omeprazole 20mg  every day ST to eval, diet modification.   Adult failure to thrive Adult failure to thrive, gradual weight loss, TSH 0.74 10/29/22, under Hospice care. 01/21/23 MOST comfort measures, IVF/ABT trial periods, no feeding tube.  Decrease Mirtazapine 7.5mg /15mg -stable mood, appears sleepy in am  Stroke [facility])   CVA right sided and facial weakness, 12/16/22 MRI patchy small volume acute ischemic nonhemorrhagic infarct to post left basal ganglia/corona radiata. DAPT x90 days then ASA 81mg  every day,  f/u neurology. Hgb 12.3 12/26/22  Hypertension  blood pressure runs low,  off meds.   CKD (chronic kidney disease) stage 3, GFR 30-59 ml/min (HCC) Bun/creat 46/1.1 12/26/22  Hyperlipidemia LDL goal <70  stable on Crestor 5mg  qd, LDL 64 12/17/22    Family/ staff Communication: plan of care reviewed with the patient and charge nurse.   Labs/tests ordered:  none  Time spend 30 minutes.

## 2023-02-18 NOTE — Assessment & Plan Note (Signed)
blood pressure runs low,  off meds.

## 2023-02-18 NOTE — Assessment & Plan Note (Signed)
Adult failure to thrive, gradual weight loss, TSH 0.74 10/29/22, under Hospice care. 01/21/23 MOST comfort measures, IVF/ABT trial periods, no feeding tube.  Decrease Mirtazapine 7.5mg /15mg -stable mood, appears sleepy in am

## 2023-02-18 NOTE — Assessment & Plan Note (Signed)
cough when swallowing, afebrile, no O2 desaturation. The patient was in bed sleeping deep, able to be arouse after spoke and touched her.  Adding Omeprazole 20mg  every day ST to eval, diet modification.

## 2023-02-18 NOTE — Progress Notes (Signed)
This encounter was created in error - please disregard.

## 2023-02-18 NOTE — Assessment & Plan Note (Signed)
 Bun/creat 46/1.1 12/26/22

## 2023-02-18 NOTE — Assessment & Plan Note (Signed)
CVA right sided and facial weakness, 12/16/22 MRI patchy small volume acute ischemic nonhemorrhagic infarct to post left basal ganglia/corona radiata. DAPT x90 days then ASA 81mg  every day, f/u neurology. Hgb 12.3 12/26/22

## 2023-02-18 NOTE — Assessment & Plan Note (Signed)
 stable on Crestor 5mg  qd, LDL 64 12/17/22

## 2023-02-19 DIAGNOSIS — M6281 Muscle weakness (generalized): Secondary | ICD-10-CM | POA: Diagnosis not present

## 2023-02-19 DIAGNOSIS — I69351 Hemiplegia and hemiparesis following cerebral infarction affecting right dominant side: Secondary | ICD-10-CM | POA: Diagnosis not present

## 2023-02-19 DIAGNOSIS — I69391 Dysphagia following cerebral infarction: Secondary | ICD-10-CM | POA: Diagnosis not present

## 2023-02-19 DIAGNOSIS — R278 Other lack of coordination: Secondary | ICD-10-CM | POA: Diagnosis not present

## 2023-02-19 DIAGNOSIS — I69320 Aphasia following cerebral infarction: Secondary | ICD-10-CM | POA: Diagnosis not present

## 2023-02-20 DIAGNOSIS — R278 Other lack of coordination: Secondary | ICD-10-CM | POA: Diagnosis not present

## 2023-02-20 DIAGNOSIS — I69351 Hemiplegia and hemiparesis following cerebral infarction affecting right dominant side: Secondary | ICD-10-CM | POA: Diagnosis not present

## 2023-02-20 DIAGNOSIS — M6281 Muscle weakness (generalized): Secondary | ICD-10-CM | POA: Diagnosis not present

## 2023-02-20 DIAGNOSIS — I69391 Dysphagia following cerebral infarction: Secondary | ICD-10-CM | POA: Diagnosis not present

## 2023-02-20 DIAGNOSIS — I69320 Aphasia following cerebral infarction: Secondary | ICD-10-CM | POA: Diagnosis not present

## 2023-02-25 DIAGNOSIS — I69351 Hemiplegia and hemiparesis following cerebral infarction affecting right dominant side: Secondary | ICD-10-CM | POA: Diagnosis not present

## 2023-02-25 DIAGNOSIS — I69391 Dysphagia following cerebral infarction: Secondary | ICD-10-CM | POA: Diagnosis not present

## 2023-02-25 DIAGNOSIS — R278 Other lack of coordination: Secondary | ICD-10-CM | POA: Diagnosis not present

## 2023-02-25 DIAGNOSIS — M6281 Muscle weakness (generalized): Secondary | ICD-10-CM | POA: Diagnosis not present

## 2023-02-25 DIAGNOSIS — I69320 Aphasia following cerebral infarction: Secondary | ICD-10-CM | POA: Diagnosis not present

## 2023-02-26 DIAGNOSIS — R278 Other lack of coordination: Secondary | ICD-10-CM | POA: Diagnosis not present

## 2023-02-26 DIAGNOSIS — M6281 Muscle weakness (generalized): Secondary | ICD-10-CM | POA: Diagnosis not present

## 2023-02-26 DIAGNOSIS — I69391 Dysphagia following cerebral infarction: Secondary | ICD-10-CM | POA: Diagnosis not present

## 2023-02-26 DIAGNOSIS — I69320 Aphasia following cerebral infarction: Secondary | ICD-10-CM | POA: Diagnosis not present

## 2023-02-26 DIAGNOSIS — I69351 Hemiplegia and hemiparesis following cerebral infarction affecting right dominant side: Secondary | ICD-10-CM | POA: Diagnosis not present

## 2023-02-27 DIAGNOSIS — I69320 Aphasia following cerebral infarction: Secondary | ICD-10-CM | POA: Diagnosis not present

## 2023-02-27 DIAGNOSIS — I69351 Hemiplegia and hemiparesis following cerebral infarction affecting right dominant side: Secondary | ICD-10-CM | POA: Diagnosis not present

## 2023-02-27 DIAGNOSIS — R278 Other lack of coordination: Secondary | ICD-10-CM | POA: Diagnosis not present

## 2023-02-27 DIAGNOSIS — M6281 Muscle weakness (generalized): Secondary | ICD-10-CM | POA: Diagnosis not present

## 2023-02-27 DIAGNOSIS — I69391 Dysphagia following cerebral infarction: Secondary | ICD-10-CM | POA: Diagnosis not present

## 2023-02-28 DIAGNOSIS — R278 Other lack of coordination: Secondary | ICD-10-CM | POA: Diagnosis not present

## 2023-02-28 DIAGNOSIS — I69351 Hemiplegia and hemiparesis following cerebral infarction affecting right dominant side: Secondary | ICD-10-CM | POA: Diagnosis not present

## 2023-02-28 DIAGNOSIS — I69391 Dysphagia following cerebral infarction: Secondary | ICD-10-CM | POA: Diagnosis not present

## 2023-02-28 DIAGNOSIS — I69320 Aphasia following cerebral infarction: Secondary | ICD-10-CM | POA: Diagnosis not present

## 2023-02-28 DIAGNOSIS — M6281 Muscle weakness (generalized): Secondary | ICD-10-CM | POA: Diagnosis not present

## 2023-03-03 DIAGNOSIS — I69320 Aphasia following cerebral infarction: Secondary | ICD-10-CM | POA: Diagnosis not present

## 2023-03-03 DIAGNOSIS — I69351 Hemiplegia and hemiparesis following cerebral infarction affecting right dominant side: Secondary | ICD-10-CM | POA: Diagnosis not present

## 2023-03-03 DIAGNOSIS — I69391 Dysphagia following cerebral infarction: Secondary | ICD-10-CM | POA: Diagnosis not present

## 2023-03-03 DIAGNOSIS — R278 Other lack of coordination: Secondary | ICD-10-CM | POA: Diagnosis not present

## 2023-03-03 DIAGNOSIS — M6281 Muscle weakness (generalized): Secondary | ICD-10-CM | POA: Diagnosis not present

## 2023-03-12 DEATH — deceased

## 2023-03-31 ENCOUNTER — Ambulatory Visit: Payer: Medicare Other | Admitting: Neurology
